# Patient Record
Sex: Male | Born: 1950
Health system: Southern US, Community
[De-identification: ages and names within clinical notes are randomized; demographics above are authoritative.]

## PROBLEM LIST (undated history)

## (undated) DIAGNOSIS — Z889 Allergy status to unspecified drugs, medicaments and biological substances status: Secondary | ICD-10-CM

## (undated) DIAGNOSIS — R519 Headache, unspecified: Secondary | ICD-10-CM

## (undated) DIAGNOSIS — I10 Essential (primary) hypertension: Secondary | ICD-10-CM

## (undated) DIAGNOSIS — R509 Fever, unspecified: Secondary | ICD-10-CM

## (undated) DIAGNOSIS — R0981 Nasal congestion: Secondary | ICD-10-CM

## (undated) DIAGNOSIS — E785 Hyperlipidemia, unspecified: Secondary | ICD-10-CM

## (undated) DIAGNOSIS — K219 Gastro-esophageal reflux disease without esophagitis: Secondary | ICD-10-CM

## (undated) DIAGNOSIS — J8410 Pulmonary fibrosis, unspecified: Secondary | ICD-10-CM

## (undated) DIAGNOSIS — R51 Headache: Secondary | ICD-10-CM

## (undated) HISTORY — DX: Nasal congestion: R09.81

## (undated) HISTORY — DX: Allergy status to unspecified drugs, medicaments and biological substances: Z88.9

## (undated) HISTORY — DX: Gastro-esophageal reflux disease without esophagitis: K21.9

## (undated) HISTORY — DX: Headache, unspecified: R51.9

## (undated) HISTORY — DX: Essential (primary) hypertension: I10

## (undated) HISTORY — DX: Hyperlipidemia, unspecified: E78.5

## (undated) HISTORY — DX: Headache: R51

## (undated) HISTORY — DX: Fever, unspecified: R50.9

---
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
## 2010-06-24 DIAGNOSIS — J8410 Pulmonary fibrosis, unspecified: Secondary | ICD-10-CM

## 2010-06-24 HISTORY — DX: Pulmonary fibrosis, unspecified: J84.10

## 2010-07-17 ENCOUNTER — Encounter
Admission: RE | Admit: 2010-07-17 | Discharge: 2010-07-17 | Payer: Self-pay | Source: Home / Self Care | Attending: Family Medicine | Admitting: Family Medicine

## 2010-10-25 ENCOUNTER — Other Ambulatory Visit: Payer: Self-pay | Admitting: Family Medicine

## 2010-10-25 DIAGNOSIS — R059 Cough, unspecified: Secondary | ICD-10-CM

## 2010-10-25 DIAGNOSIS — R0600 Dyspnea, unspecified: Secondary | ICD-10-CM

## 2010-10-25 DIAGNOSIS — R05 Cough: Secondary | ICD-10-CM

## 2010-10-26 ENCOUNTER — Ambulatory Visit
Admission: RE | Admit: 2010-10-26 | Discharge: 2010-10-26 | Disposition: A | Discharge status: Home / Self Care | Payer: BC Managed Care – PPO | Source: Ambulatory Visit | Attending: Family Medicine | Admitting: Family Medicine

## 2010-10-26 DIAGNOSIS — R05 Cough: Secondary | ICD-10-CM

## 2010-10-26 DIAGNOSIS — R0600 Dyspnea, unspecified: Secondary | ICD-10-CM

## 2010-10-26 DIAGNOSIS — R059 Cough, unspecified: Secondary | ICD-10-CM

## 2010-10-26 MED ORDER — IOHEXOL 300 MG/ML  SOLN
75.0000 mL | Freq: Once | INTRAMUSCULAR | Status: AC | PRN
  Administered 2010-10-26: 75 mL via INTRAVENOUS

## 2010-10-30 ENCOUNTER — Encounter: Payer: Self-pay | Admitting: Internal Medicine

## 2010-10-31 ENCOUNTER — Other Ambulatory Visit (HOSPITAL_COMMUNITY)
Admission: RE | Admit: 2010-10-31 | Discharge: 2010-10-31 | Disposition: A | Discharge status: Home / Self Care | Payer: BC Managed Care – PPO | Source: Ambulatory Visit | Attending: Internal Medicine | Admitting: Internal Medicine

## 2010-10-31 ENCOUNTER — Ambulatory Visit (INDEPENDENT_AMBULATORY_CARE_PROVIDER_SITE_OTHER): Payer: BC Managed Care – PPO | Admitting: Internal Medicine

## 2010-10-31 ENCOUNTER — Other Ambulatory Visit: Payer: Self-pay | Admitting: Internal Medicine

## 2010-10-31 ENCOUNTER — Encounter: Payer: Self-pay | Admitting: Internal Medicine

## 2010-10-31 ENCOUNTER — Ambulatory Visit (INDEPENDENT_AMBULATORY_CARE_PROVIDER_SITE_OTHER)
Admission: RE | Admit: 2010-10-31 | Discharge: 2010-10-31 | Disposition: A | Discharge status: Home / Self Care | Payer: BC Managed Care – PPO | Source: Ambulatory Visit | Attending: Internal Medicine | Admitting: Internal Medicine

## 2010-10-31 ENCOUNTER — Other Ambulatory Visit: Payer: BC Managed Care – PPO

## 2010-10-31 DIAGNOSIS — R059 Cough, unspecified: Secondary | ICD-10-CM

## 2010-10-31 DIAGNOSIS — R0602 Shortness of breath: Secondary | ICD-10-CM | POA: Insufficient documentation

## 2010-10-31 DIAGNOSIS — R05 Cough: Secondary | ICD-10-CM | POA: Insufficient documentation

## 2010-10-31 DIAGNOSIS — J90 Pleural effusion, not elsewhere classified: Secondary | ICD-10-CM | POA: Insufficient documentation

## 2010-10-31 NOTE — Progress Notes (Signed)
Quick Note:  Spoke with pt and notified of results per Dr. Wert. Pt verbalized understanding and denied any questions.  ______ 

## 2010-10-31 NOTE — Progress Notes (Signed)
  Subjective:    Patient ID: Larry [name], male    DOB: [birth year], 59 y.o.   MRN: [id number]  HPI  37 yobm  Never smoked in country since around 1990 from [place]    10/31/2010 Initial pulmonary office eval cc cough x 2 months and sob x sev weeks x fast walk,  Really not very productive. Pt denies any significant sore throat, dysphagia, itching, sneezing,  nasal congestion or excess/ purulent secretions,  fever, chills, sweats, unintended wt loss, pleuritic or exertional cp, hempoptysis, orthopnea pnd or leg swelling.    Also denies any obvious fluctuation of symptoms with weather or environmental changes or other aggravating or alleviating factors.   No h/o rheumatologic dz, previous cancer, pna.  Does have some dental issues but not for past year.     PMHx: Hypertension Hyperlipidemia R Pleural effusion     - R tcentesis x 800 cc 10/31/2010    Surgery:  None   Soc Hx: Never smoked, no heavy industry  Fm Hx  HBP/  Dm mother ? Colon problem in father 5 total siblings all healthy  Review of Systems  Constitutional: Positive for unexpected weight change. Negative for fever, chills, activity change and appetite change.  HENT: Positive for congestion and sore throat. Negative for rhinorrhea, sneezing, trouble swallowing, dental problem, voice change and postnasal drip.   Eyes: Negative for visual disturbance.  Respiratory: Positive for cough and shortness of breath. Negative for choking.   Cardiovascular: Negative for chest pain and leg swelling.  Gastrointestinal: Negative for nausea, vomiting and abdominal pain.  Genitourinary: Negative for difficulty urinating.  Musculoskeletal: Negative for arthralgias.  Skin: Negative for rash.  Neurological: Positive for headaches.  Psychiatric/Behavioral: Negative for behavioral problems and confusion.       Objective:   Physical Exam     amb bm nad   Wt 199 10/31/2010  HEENT: nl dentition, turbinates, and orophanx. Nl external  ear canals without cough reflex   NECK :  without JVD/Nodes/TM/ nl carotid upstrokes bilaterally   LUNGS: no acc muscle use, decreased bs with dullness right base   CV:  RRR  no s3 or murmur or increase in P2, no edema   ABD:  soft and nontender with nl excursion in the supine position. No bruits or organomegaly, bowel sounds nl  MS:  warm without deformities, calf tenderness, cyanosis or clubbing  SKIN: warm and dry without lesions    NEURO:  alert, approp, no deficits   Ct chest 10/25/10 reviewed:  1. Large right hilar mass is suspicious for primary lung neoplasm.  2. Evidence of extensive mediastinal and left hilar lymph node  metastasis.  3. Large right pleural effusion.  4. Postobstructive atelectasis and consolidation involving the  right upper lobe and right middle lobe.  4. Cannot exclude lymphangitic spread of tumor throughout the  right upper lobe.   Assessment & Plan:

## 2010-10-31 NOTE — Assessment & Plan Note (Signed)
Procedure R thoracentesis performed under sterile conditions with 1% lidocaine  #18 g needle at right base first stick no air > 800 cc fluid obtained  F/u cxr decreased effusion, no ptx  ddx includes parapneumonic and malignant sources, await studies

## 2010-10-31 NOTE — Patient Instructions (Signed)
We will call you with results when available.

## 2010-11-02 ENCOUNTER — Telehealth: Payer: Self-pay | Admitting: Internal Medicine

## 2010-11-02 NOTE — Telephone Encounter (Signed)
Please advise Dr. Sherene Sires pt is requesting cxr results from 5/9. Thanks  Carver Fila, CMA

## 2010-11-06 ENCOUNTER — Telehealth: Payer: Self-pay | Admitting: Internal Medicine

## 2010-11-06 NOTE — Telephone Encounter (Signed)
I don't see any results in the emr at this point - see if they can just fax  the results to me or if you can print them somehow - I'm getting lost on all the emr bridges to no where on this case.

## 2010-11-06 NOTE — Telephone Encounter (Signed)
Dr. Sherene Sires, I told him that we would call him with lab results, have you seen anything come back yet? Pls advise, thanks!

## 2010-11-07 NOTE — Telephone Encounter (Signed)
Called [facility] and requested labs to be faxed.

## 2010-11-08 NOTE — Telephone Encounter (Signed)
Per MW- pt needs appt early next wk for f/u with cxr. Spoke with pt and sched appt for 11/12/10 at 9 am.

## 2010-11-12 ENCOUNTER — Ambulatory Visit (INDEPENDENT_AMBULATORY_CARE_PROVIDER_SITE_OTHER)
Admission: RE | Admit: 2010-11-12 | Discharge: 2010-11-12 | Disposition: A | Discharge status: Home / Self Care | Payer: BC Managed Care – PPO | Source: Ambulatory Visit | Attending: Internal Medicine | Admitting: Internal Medicine

## 2010-11-12 ENCOUNTER — Encounter: Payer: Self-pay | Admitting: Internal Medicine

## 2010-11-12 ENCOUNTER — Ambulatory Visit (INDEPENDENT_AMBULATORY_CARE_PROVIDER_SITE_OTHER): Payer: BC Managed Care – PPO | Admitting: Internal Medicine

## 2010-11-12 VITALS — BP 136/82 | HR 75 | Temp 98.0°F | Ht 74.0 in | Wt 199.0 lb

## 2010-11-12 DIAGNOSIS — J90 Pleural effusion, not elsewhere classified: Secondary | ICD-10-CM

## 2010-11-12 MED ORDER — TRAMADOL HCL 50 MG PO TABS: ORAL_TABLET | ORAL | Status: AC

## 2010-11-12 NOTE — Patient Instructions (Signed)
Please see patient coordinator before you leave today  to schedule thoracic surgery consultation for possible VATS surgery (like arthroscopy for a knee)  If needed we can remove fluid from your right lung on Friday am but call my nurse Verlon Au before you come at [phone] to arrange a time  For pain or cough try tramadol 1-2 every 4 hours as needed

## 2010-11-12 NOTE — Telephone Encounter (Signed)
Pt was seen today by Dr. Sherene Sires to discuss cxr results.  Therefore will sign off on this message.

## 2010-11-12 NOTE — Progress Notes (Signed)
  Subjective:    Patient ID: Larry [name], male    DOB: 1951/03/26, 60 y.o.   MRN: [id number]  HPI  20 yobm  Never smoked in country since around 1990 from [place]    10/31/2010 Initial pulmonary office eval cc cough x 2 months and sob x sev weeks x fast walk,  Really not very productive. rec dx tap > improved sob for a least a week.  Non dx features of chronic exudate  11/12/2010 ov/Elby Blackwelder cc sob with ex recurrent but not as severe, still dry cough.  Pt denies any significant sore throat, dysphagia, itching, sneezing,  nasal congestion or excess/ purulent secretions,  fever, chills, sweats, unintended wt loss, pleuritic or exertional cp, hempoptysis, orthopnea pnd or leg swelling.    Also denies any obvious fluctuation of symptoms with weather or environmental changes or other aggravating or alleviating factors.     PMHx: Hypertension Hyperlipidemia R Pleural effusion     - R tcentesis x 800 cc 10/31/2010  Exudative Lymph> segs, benign inflammatory changes on cyt   Surgery:  None   Soc Hx: Never smoked, no heavy industry  Fm Hx  HBP/  Dm mother ? Colon problem in father 5 total siblings all healthy        Objective:   Physical Exam     amb bm nad   Wt 199 10/31/2010  > 199 11/12/2010  HEENT: nl dentition, turbinates, and orophanx. Nl external ear canals without cough reflex   NECK :  without JVD/Nodes/TM/ nl carotid upstrokes bilaterally   LUNGS: no acc muscle use, decreased bs with dullness right base   CV:  RRR  no s3 or murmur or increase in P2, no edema   ABD:  soft and nontender with nl excursion in the supine position. No bruits or organomegaly, bowel sounds nl  MS:  warm without deformities, calf tenderness, cyanosis or clubbing  SKIN: warm and dry without lesions      Ct chest 10/25/10 reviewed:  1. Large right hilar mass is suspicious for primary lung neoplasm.  2. Evidence of extensive mediastinal and left hilar lymph node  metastasis.  3. Large  right pleural effusion.  4. Postobstructive atelectasis and consolidation involving the  right upper lobe and right middle lobe.  4. Cannot exclude lymphangitic spread of tumor throughout the  right upper lobe.   Assessment & Plan:

## 2010-11-12 NOTE — Progress Notes (Signed)
Quick Note:  Spoke with pt and notified of results per Dr. Wert. Pt verbalized understanding and denied any questions.  ______ 

## 2010-11-12 NOTE — Assessment & Plan Note (Signed)
Discussed in detail all the  indications, usual  risks and alternatives  relative to the benefits with patient who agrees to proceed with VATS referral as next step though also may need fob same time to be sure thiere is no airway obstructions  Can do therapeutic tap if needed while awaiting T surgery evaluation

## 2010-11-13 ENCOUNTER — Encounter: Payer: Self-pay | Admitting: Internal Medicine

## 2010-11-14 ENCOUNTER — Inpatient Hospital Stay (HOSPITAL_COMMUNITY): Payer: BC Managed Care – PPO

## 2010-11-14 ENCOUNTER — Emergency Department (HOSPITAL_COMMUNITY): Payer: BC Managed Care – PPO

## 2010-11-14 ENCOUNTER — Inpatient Hospital Stay (HOSPITAL_COMMUNITY)
Admission: EM | Admit: 2010-11-14 | Discharge: 2010-11-22 | DRG: 085 | Disposition: A | Discharge status: Home Health | Payer: BC Managed Care – PPO | Attending: Critical Care Medicine | Admitting: Critical Care Medicine

## 2010-11-14 ENCOUNTER — Encounter: Payer: BC Managed Care – PPO | Admitting: Thoracic Surgery

## 2010-11-14 DIAGNOSIS — R222 Localized swelling, mass and lump, trunk: Secondary | ICD-10-CM | POA: Diagnosis not present

## 2010-11-14 DIAGNOSIS — E876 Hypokalemia: Secondary | ICD-10-CM | POA: Diagnosis not present

## 2010-11-14 DIAGNOSIS — I10 Essential (primary) hypertension: Secondary | ICD-10-CM | POA: Diagnosis present

## 2010-11-14 DIAGNOSIS — J90 Pleural effusion, not elsewhere classified: Principal | ICD-10-CM | POA: Diagnosis present

## 2010-11-14 LAB — CBC
HCT: 36.1 % — ABNORMAL LOW (ref 39.0–52.0)
Hemoglobin: 12.6 g/dL — ABNORMAL LOW (ref 13.0–17.0)
MCH: 28.6 pg (ref 26.0–34.0)
MCHC: 34.9 g/dL (ref 30.0–36.0)

## 2010-11-14 LAB — URINALYSIS, ROUTINE W REFLEX MICROSCOPIC
Bilirubin Urine: NEGATIVE
Nitrite: NEGATIVE
Specific Gravity, Urine: 1.029 (ref 1.005–1.030)
pH: 7 (ref 5.0–8.0)

## 2010-11-14 LAB — DIFFERENTIAL
Basophils Absolute: 0 10*3/uL (ref 0.0–0.1)
Lymphocytes Relative: 19 % (ref 12–46)
Monocytes Absolute: 0.7 10*3/uL (ref 0.1–1.0)
Monocytes Relative: 14 % — ABNORMAL HIGH (ref 3–12)
Neutro Abs: 3.2 10*3/uL (ref 1.7–7.7)

## 2010-11-14 LAB — POCT I-STAT, CHEM 8
Chloride: 101 mEq/L (ref 96–112)
Glucose, Bld: 104 mg/dL — ABNORMAL HIGH (ref 70–99)
HCT: 37 % — ABNORMAL LOW (ref 39.0–52.0)
Potassium: 3 mEq/L — ABNORMAL LOW (ref 3.5–5.1)

## 2010-11-14 LAB — PROTIME-INR: INR: 1.11 (ref 0.00–1.49)

## 2010-11-14 MED ORDER — IOHEXOL 300 MG/ML  SOLN
100.0000 mL | Freq: Once | INTRAMUSCULAR | Status: AC | PRN
  Administered 2010-11-14: 100 mL via INTRAVENOUS

## 2010-11-15 ENCOUNTER — Inpatient Hospital Stay (HOSPITAL_COMMUNITY): Payer: BC Managed Care – PPO

## 2010-11-15 ENCOUNTER — Other Ambulatory Visit: Payer: Self-pay | Admitting: Interventional Radiology

## 2010-11-15 ENCOUNTER — Other Ambulatory Visit (HOSPITAL_COMMUNITY): Payer: BC Managed Care – PPO

## 2010-11-15 DIAGNOSIS — J90 Pleural effusion, not elsewhere classified: Secondary | ICD-10-CM

## 2010-11-15 DIAGNOSIS — D381 Neoplasm of uncertain behavior of trachea, bronchus and lung: Secondary | ICD-10-CM

## 2010-11-15 LAB — CBC
MCV: 82.4 fL (ref 78.0–100.0)
Platelets: 219 10*3/uL (ref 150–400)
RBC: 4.61 MIL/uL (ref 4.22–5.81)
WBC: 5.7 10*3/uL (ref 4.0–10.5)

## 2010-11-15 LAB — PROTIME-INR
INR: 1.08 (ref 0.00–1.49)
Prothrombin Time: 14.2 seconds (ref 11.6–15.2)

## 2010-11-15 LAB — BASIC METABOLIC PANEL
BUN: 7 mg/dL (ref 6–23)
Calcium: 10 mg/dL (ref 8.4–10.5)
Creatinine, Ser: 0.66 mg/dL (ref 0.4–1.5)
GFR calc Af Amer: >60 mL/min (ref >60)

## 2010-11-15 LAB — URINE CULTURE
Colony Count: 9000
Culture  Setup Time: 201205240026

## 2010-11-15 LAB — PROTEIN, BODY FLUID: Total protein, fluid: 5.2 g/dL

## 2010-11-15 LAB — PSA: PSA: 1.16 ng/mL (ref <=4.00)

## 2010-11-15 LAB — BODY FLUID CELL COUNT WITH DIFFERENTIAL
Neutrophil Count, Fluid: 1 % (ref 0–25)
Total Nucleated Cell Count, Fluid: 4066 cu mm — ABNORMAL HIGH (ref 0–1000)

## 2010-11-15 LAB — PROTEIN, TOTAL: Total Protein: 6.7 g/dL (ref 6.0–8.3)

## 2010-11-15 LAB — LACTATE DEHYDROGENASE, PLEURAL OR PERITONEAL FLUID

## 2010-11-15 LAB — APTT: aPTT: 34 seconds (ref 24–37)

## 2010-11-16 ENCOUNTER — Inpatient Hospital Stay (HOSPITAL_COMMUNITY): Payer: BC Managed Care – PPO

## 2010-11-16 ENCOUNTER — Other Ambulatory Visit: Payer: Self-pay | Admitting: Emergency Medicine

## 2010-11-16 ENCOUNTER — Other Ambulatory Visit: Payer: Self-pay | Admitting: Thoracic Surgery

## 2010-11-16 DIAGNOSIS — J90 Pleural effusion, not elsewhere classified: Secondary | ICD-10-CM

## 2010-11-16 DIAGNOSIS — J8410 Pulmonary fibrosis, unspecified: Secondary | ICD-10-CM

## 2010-11-16 HISTORY — PX: ENDOBRONCHIAL ULTRASOUND: SHX5096

## 2010-11-16 HISTORY — PX: OTHER SURGICAL HISTORY: SHX169

## 2010-11-16 HISTORY — PX: FIBEROPTIC BRONCHOSCOPY: SHX5367

## 2010-11-16 LAB — COMPREHENSIVE METABOLIC PANEL
ALT: 24 U/L (ref 0–53)
AST: 24 U/L (ref 0–37)
Alkaline Phosphatase: 93 U/L (ref 39–117)
CO2: 30 mEq/L (ref 19–32)
Calcium: 9.6 mg/dL (ref 8.4–10.5)
GFR calc Af Amer: >60 mL/min (ref >60)
GFR calc non Af Amer: >60 mL/min (ref >60)
Glucose, Bld: 103 mg/dL — ABNORMAL HIGH (ref 70–99)
Potassium: 3.1 mEq/L — ABNORMAL LOW (ref 3.5–5.1)
Sodium: 137 mEq/L (ref 135–145)
Total Protein: 6.5 g/dL (ref 6.0–8.3)

## 2010-11-16 LAB — TYPE AND SCREEN
ABO/RH(D): A POS
Antibody Screen: NEGATIVE

## 2010-11-16 LAB — TRIGLYCERIDES, BODY FLUIDS: Triglycerides, Fluid: 21 mg/dL

## 2010-11-16 LAB — CBC
HCT: 36.6 % — ABNORMAL LOW (ref 39.0–52.0)
Hemoglobin: 12.6 g/dL — ABNORMAL LOW (ref 13.0–17.0)
MCHC: 34.4 g/dL (ref 30.0–36.0)
WBC: 4.3 10*3/uL (ref 4.0–10.5)

## 2010-11-16 LAB — MAGNESIUM: Magnesium: 1.8 mg/dL (ref 1.5–2.5)

## 2010-11-16 LAB — SURGICAL PCR SCREEN: Staphylococcus aureus: NEGATIVE

## 2010-11-17 ENCOUNTER — Inpatient Hospital Stay (HOSPITAL_COMMUNITY): Payer: BC Managed Care – PPO

## 2010-11-17 LAB — BASIC METABOLIC PANEL
CO2: 29 mEq/L (ref 19–32)
Chloride: 99 mEq/L (ref 96–112)
GFR calc non Af Amer: >60 mL/min (ref >60)
Glucose, Bld: 138 mg/dL — ABNORMAL HIGH (ref 70–99)
Potassium: 3.3 mEq/L — ABNORMAL LOW (ref 3.5–5.1)
Sodium: 135 mEq/L (ref 135–145)

## 2010-11-17 LAB — CBC
HCT: 37.2 % — ABNORMAL LOW (ref 39.0–52.0)
Hemoglobin: 13 g/dL (ref 13.0–17.0)
MCV: 82.5 fL (ref 78.0–100.0)
RBC: 4.51 MIL/uL (ref 4.22–5.81)
WBC: 7.1 10*3/uL (ref 4.0–10.5)

## 2010-11-17 LAB — POCT I-STAT 3, ART BLOOD GAS (G3+)
Bicarbonate: 28.3 mEq/L — ABNORMAL HIGH (ref 20.0–24.0)
TCO2: 30 mmol/L (ref 0–100)
pH, Arterial: 7.445 (ref 7.350–7.450)
pO2, Arterial: 85 mmHg (ref 80.0–100.0)

## 2010-11-18 ENCOUNTER — Inpatient Hospital Stay (HOSPITAL_COMMUNITY): Payer: BC Managed Care – PPO

## 2010-11-18 LAB — CBC
Platelets: 228 10*3/uL (ref 150–400)
RDW: 13.6 % (ref 11.5–15.5)
WBC: 9.1 10*3/uL (ref 4.0–10.5)

## 2010-11-18 LAB — COMPREHENSIVE METABOLIC PANEL
ALT: 18 U/L (ref 0–53)
Alkaline Phosphatase: 84 U/L (ref 39–117)
CO2: 29 mEq/L (ref 19–32)
Calcium: 9.3 mg/dL (ref 8.4–10.5)
GFR calc non Af Amer: >60 mL/min (ref >60)
Glucose, Bld: 121 mg/dL — ABNORMAL HIGH (ref 70–99)
Potassium: 3.4 mEq/L — ABNORMAL LOW (ref 3.5–5.1)
Sodium: 133 mEq/L — ABNORMAL LOW (ref 135–145)

## 2010-11-18 LAB — BODY FLUID CULTURE: Culture: NO GROWTH

## 2010-11-19 ENCOUNTER — Inpatient Hospital Stay (HOSPITAL_COMMUNITY): Payer: BC Managed Care – PPO

## 2010-11-19 LAB — BASIC METABOLIC PANEL
CO2: 30 mEq/L (ref 19–32)
Calcium: 8.9 mg/dL (ref 8.4–10.5)
Chloride: 97 mEq/L (ref 96–112)
GFR calc Af Amer: >60 mL/min (ref >60)
Sodium: 135 mEq/L (ref 135–145)

## 2010-11-19 LAB — URINALYSIS, ROUTINE W REFLEX MICROSCOPIC
Ketones, ur: NEGATIVE mg/dL
Nitrite: NEGATIVE
Urobilinogen, UA: 1 mg/dL (ref 0.0–1.0)
pH: 7 (ref 5.0–8.0)

## 2010-11-19 LAB — TISSUE CULTURE: Culture: NO GROWTH

## 2010-11-19 LAB — PHOSPHORUS: Phosphorus: 3 mg/dL (ref 2.3–4.6)

## 2010-11-19 LAB — CBC
Hemoglobin: 12.7 g/dL — ABNORMAL LOW (ref 13.0–17.0)
MCHC: 35.1 g/dL (ref 30.0–36.0)
RBC: 4.41 MIL/uL (ref 4.22–5.81)

## 2010-11-19 LAB — MAGNESIUM: Magnesium: 2 mg/dL (ref 1.5–2.5)

## 2010-11-20 ENCOUNTER — Inpatient Hospital Stay (HOSPITAL_COMMUNITY): Payer: BC Managed Care – PPO

## 2010-11-20 LAB — BODY FLUID CULTURE

## 2010-11-20 LAB — URINE CULTURE: Colony Count: NO GROWTH

## 2010-11-20 LAB — BASIC METABOLIC PANEL
BUN: 9 mg/dL (ref 6–23)
Calcium: 7 mg/dL — ABNORMAL LOW (ref 8.4–10.5)
Creatinine, Ser: 0.47 mg/dL (ref 0.4–1.5)
GFR calc non Af Amer: >60 mL/min (ref >60)
Potassium: 2.9 mEq/L — ABNORMAL LOW (ref 3.5–5.1)

## 2010-11-21 ENCOUNTER — Inpatient Hospital Stay (HOSPITAL_COMMUNITY): Payer: BC Managed Care – PPO

## 2010-11-21 NOTE — Op Note (Signed)
  NAMEDAIDEN, COLTRANE NO.:  [id number]  MEDICAL RECORD NO.:  [id number]           PATIENT TYPE:  LOCATION:                                 FACILITY:  PHYSICIAN:  Ines Bloomer, M.D. DATE OF BIRTH:  [birth year]  DATE OF PROCEDURE: DATE OF DISCHARGE:                              OPERATIVE REPORT   PREOPERATIVE DIAGNOSES: 1. Right hilar mass. 2. Right pleural effusion.  POSTOPERATIVE DIAGNOSES: 1. Right hilar mass. 2. Right pleural effusion. 3. Questionable granulomatous process.  OPERATIONS PERFORMED: 1. Fiberoptic bronchoscopy. 2. Endobronchial ultrasound. 3. Right VATS. 4. Drainage of pleural effusion. 5. Pleural biopsies. 6. Insertion of PleurX catheter.  SURGEONS: 1. Ines Bloomer, MD 2. Leslye Peer, MD  General anesthesia.  Dr. Delton Coombes will dictate the endobronchial ultrasound, an EBUS.  This patient, after having this bronchoscopy, was turned to right lateral thoracotomy position and was prepped and draped in usual sterile manner. Three trocar sites were made, one at the midaxillary line at the seventh intercostal space and a 30-degrees scope was inserted and pictures were taken of multiple processes.  Multiple nodules both on the lung as well as on the pleura as well as evidence of plain lymph nodes.  We sent this for frozen section and it showed a granulomatous process.  We also sent the fluid for culture, fungus and TB culture as well as the pleural biopsies for fungus and TB culture.  We then inserted a #24 chest tube through the trocar site, but prior to doing this, we made two other 5-mm trocar sites and through that, inserted a PleurX catheter directly under direct vision into the right pleura.  We then tunneled it so that the cuff was in the tunnel track and brought out the tunneler, the PleurX catheter anteriorly, and applied a dressing.  We sutured the chest tube in place with 0 silk and then a Marcaine block in the  usual fashion.  The patient was returned to the recovery room in stable condition.     Ines Bloomer, M.D.     DPB/MEDQ  D:  11/16/2010  T:  11/17/2010  Job:  [id number]  Electronically Signed by Jovita Gamma M.D. on 11/21/2010 10:54:27 AM

## 2010-11-21 NOTE — Consult Note (Signed)
  [name], [name] NO.:  [id number]  MEDICAL RECORD NO.:  [id number]           PATIENT TYPE:  I  LOCATION:  5529                         FACILITY:  [facility]  PHYSICIAN:  Ines Bloomer, M.D. DATE OF BIRTH:  [birth year]  DATE OF CONSULTATION: DATE OF DISCHARGE:                                CONSULTATION   CHIEF COMPLAINT:  Shortness of breath.  HISTORY OF PRESENT ILLNESS:  This 60 year old patient was admitted yesterday with recurrent right pleural effusion.  He had thoracentesis in the office on Nov 03, 2010, that had 800 mL of fluid and was sent to see in the office but came to the emergency room with increasing shortness of breath and got admitted and have a right thoracentesis, which was drained 1500 mL.  The patient also on CT scan has a right upper lobe mass with a mediastinal adenopathy.  He has had no hemoptysis, fever, chills, or excessive sputum.  PAST MEDICAL HISTORY:  Hypertension and hypercholesterolemia.  MEDICATIONS:  Tramadol, Phenergan with codeine for cough, mometasone cream, ibuprofen, fluconazole nasal spray, Lipitor, atenolol, hydrochlorothiazide, EMLA, amlodipine, and Tylenol with Codeine.  He has no allergies.  FAMILY HISTORY:  Noncontributory.  SOCIAL HISTORY:  As mentioned he is married.  Does not smoke, does not drink, been treated previously by [facility].  REVIEW OF SYSTEMS:  GENERAL:  No weight loss, has been unstable. CARDIAC:  No angina or atrial fibrillation.  PULMONARY:  Shortness of breath and a dry cough.  No hemoptysis.  GI:  No nausea, vomiting, constipation, or diarrhea.  GU:  No kidney disease, dysuria or frequent urination.  VASCULAR:  No claudication, DVT, TIAs.  NEUROLOGICAL:  No dizziness, headaches, blackouts, seizures.  MUSCULOSKELETAL:  No arthritis.  PSYCHIATRIC:  No depression or nervousness.  EYES/ENT:  No changes in eyesight or hearing.  HEMATOLOGICAL:  No problems with bleeding,  clotting disorders, or anemia.  PHYSICAL EXAMINATION:  GENERAL:  He is a well-developed Caucasian [place] American male in no acute distress. VITAL SIGNS:  His blood pressure was 139/69, pulse 80, respirations 18, sats were 98%. HEAD, EYES, EARS, NOSE, AND THROAT:  Unremarkable. NECK:  Supple without thyromegaly.  There is no supraclavicular or axillary adenopathy. CHEST:  Clear to auscultation and percussion on the left, decreased breath sounds on the right. HEART:  Regular sinus rhythm.  No murmurs. ABDOMEN:  Soft.  There is no hepatosplenomegaly. EXTREMITIES:  Pulses are 2+.  There is no clubbing or edema. NEUROLOGICAL:  He is oriented x3.  Sensory and motor intact.  Cranial nerves intact.  IMPRESSION: 1. Right upper lobe mass on CT scan; right pleural effusion,     recurrent; questionable pleural involvement. 2. Hypertension. 3. Hyperlipidemia.  PLAN:  Fiberoptic bronchoscopy with endobronchial ultrasound, right VATS, drainage of pleural effusion, talc pleurodesis, and insertion of PleurX catheter.     Ines Bloomer, M.D.     DPB/MEDQ  D:  11/15/2010  T:  11/16/2010  Job:  [id number]  Electronically Signed by Jovita Gamma M.D. on 11/21/2010 10:54:25 AM

## 2010-11-22 ENCOUNTER — Inpatient Hospital Stay (HOSPITAL_COMMUNITY): Payer: BC Managed Care – PPO

## 2010-11-22 LAB — BASIC METABOLIC PANEL
CO2: 28 mEq/L (ref 19–32)
Chloride: 99 mEq/L (ref 96–112)
GFR calc Af Amer: >60 mL/min (ref >60)
Glucose, Bld: 95 mg/dL (ref 70–99)
Potassium: 4 mEq/L (ref 3.5–5.1)
Sodium: 135 mEq/L (ref 135–145)

## 2010-11-24 LAB — HISTOPLASMA ANTIGEN, URINE

## 2010-11-27 ENCOUNTER — Other Ambulatory Visit: Payer: Self-pay | Admitting: Thoracic Surgery

## 2010-11-27 DIAGNOSIS — J90 Pleural effusion, not elsewhere classified: Secondary | ICD-10-CM

## 2010-11-28 ENCOUNTER — Ambulatory Visit (INDEPENDENT_AMBULATORY_CARE_PROVIDER_SITE_OTHER): Payer: Self-pay | Admitting: Thoracic Surgery

## 2010-11-28 ENCOUNTER — Ambulatory Visit
Admission: RE | Admit: 2010-11-28 | Discharge: 2010-11-28 | Disposition: A | Discharge status: Home / Self Care | Payer: BC Managed Care – PPO | Source: Ambulatory Visit | Attending: Thoracic Surgery | Admitting: Thoracic Surgery

## 2010-11-28 DIAGNOSIS — J90 Pleural effusion, not elsewhere classified: Secondary | ICD-10-CM

## 2010-11-28 DIAGNOSIS — J8410 Pulmonary fibrosis, unspecified: Secondary | ICD-10-CM

## 2010-11-29 NOTE — Letter (Signed)
November 28, 2010  Charlaine Dalton. Sherene Sires, MD, FCCP [address] [place], [place] [address]  Re:  Larry [name], Larry [name]                 DOB:  1950-08-16  Dear Dr. Sherene Sires:  I saw the patient back today.  His chest x-ray still shows an inflammatory process in the right lower lobe.  He has minimal drainage from his PleurX catheter.  His blood pressure was 143/75, sats were 96%. He is feeling well, removed his chest tube stitches, as well as pleuritic stitches.  We tried to drain today and it did not drain a whole lot, so probably we will remove his PleurX next week when I see him back with another chest x-ray.  We are still awaiting culture results.  Larry [name], M.D. Electronically Signed  DPB/MEDQ  D:  11/28/2010  T:  11/29/2010  Job:  [id number]  cc:   Dr. Tenny Craw

## 2010-12-03 ENCOUNTER — Other Ambulatory Visit: Payer: Self-pay | Admitting: Thoracic Surgery

## 2010-12-03 DIAGNOSIS — J90 Pleural effusion, not elsewhere classified: Secondary | ICD-10-CM

## 2010-12-04 ENCOUNTER — Ambulatory Visit (INDEPENDENT_AMBULATORY_CARE_PROVIDER_SITE_OTHER): Payer: Self-pay | Admitting: Thoracic Surgery

## 2010-12-04 ENCOUNTER — Ambulatory Visit
Admission: RE | Admit: 2010-12-04 | Discharge: 2010-12-04 | Disposition: A | Discharge status: Home / Self Care | Payer: BC Managed Care – PPO | Source: Ambulatory Visit | Attending: Thoracic Surgery | Admitting: Thoracic Surgery

## 2010-12-04 DIAGNOSIS — J90 Pleural effusion, not elsewhere classified: Secondary | ICD-10-CM

## 2010-12-05 NOTE — Letter (Signed)
December 04, 2010  Charlaine Dalton. Sherene Sires, MD, FCCP [address] [place], [place] [address]  Re:  BURNIE, THERIEN                 DOB:  September 10, 1950  The patient came today.  His blood pressure was 133/80, pulse 68, respirations 18, sats were 95.  He was afebrile.  Chest x-ray still showed there was some kind of inflammatory process in the right lower lobe.  We did not drain any more from his PleurX catheter, so I went ahead and removed the PleurX catheter.  We will see him back in 2 weeks and hopefully, we will have some possible results of the culture by that time and then I will get him to see Dr. Luciana Axe.  Ines Bloomer, M.D. Electronically Signed  DPB/MEDQ  D:  12/04/2010  T:  12/05/2010  Job:  [id number]  cc:   Gardiner Barefoot, MD Charlaine Dalton. Sherene Sires, MD, FCCP

## 2010-12-05 NOTE — Op Note (Signed)
Larry [name], Larry [name]               ACCOUNT NO.:  [id number]  MEDICAL RECORD NO.:  [id number]           PATIENT TYPE:  I  LOCATION:  2302                         FACILITY:  [facility]  PHYSICIAN:  Leslye Peer, MD    DATE OF BIRTH:  12/09/1950  DATE OF PROCEDURE:  11/16/2010 DATE OF DISCHARGE:                              OPERATIVE REPORT   PROCEDURE:  Video fiberoptic bronchoscopy with endobronchial ultrasound and biopsies.  OPERATOR:  Leslye Peer, MD.  INDICATIONS:  Right upper lobe mass and pleural effusion.  MEDICATIONS GIVEN:  The procedure was done under general anesthesia with endotracheal intubation.  CONSENT:  Informed consent was signed by the patient and is on his chart.  PROCEDURE DETAILS:  After informed consent was obtained, the patient was taken to the operating room and placed under general anesthesia.  A formal time-out was performed and then a screening bronchoscopy was initiated via the patient's endotracheal tube.  The main carina was sharp and normal in appearance.  The right mainstem bronchus showed proximal pearly white lesions on the airway wall.  These extended from the right upper lobe orifice where there was an endobronchial lesion occluding the anterior subsegment consistent with a primary lung carcinoma.  The pearly white endobronchial abnormalities extended down the bronchus intermedius, particularly along the lateral wall and to the right middle lobe and right lower lobe airways.  The bronchoscope was then withdrawn and a left-sided inspection was performed.  There were no mucosal abnormalities seen in the left mainstem, left upper lobe lingula, and left lower lobe airways.  There were no endobronchial lesions or abnormal secretions on the left side.  The standard bronchoscope was then removed and the endobronchial ultrasound was introduced.  Using the endobronchial ultrasound, Wang needle biopsies were performed into a right upper lobe mass  that was localized in the medial aspect of the right upper lobe as well as the lateral aspect of the bronchus intermedius.  Needle biopsies were performed at these locations and sent for cytology.  The patient tolerated the Wang needle biopsies well.  The endobronchial ultrasound was removed and the standard bronchoscope was reintroduced.  Using the standard bronchoscope, endobronchial brushings were performed in the right upper lobe along the periphery of the apparent tumor.  These were sent for cytology.  Wang needle biopsies were also performed into the right upper lobe mass without ultrasound guidance to be sent for cytology.  Finally, endobronchial forceps biopsies were performed in the right upper lobe, at the right upper lobe carina, at the right middle lobe carina, and at the right lower lobe carina.  These samples will be sent for pathology. A bronchoalveolar lavage from the right upper lobe was performed and will be sent for cytology.  The patient tolerated the procedure well. There was no significant blood loss and there were no obvious complications.  At the end of the procedure, the patient was positioned and prepped to undergo a right VATS by Dr. Karle Plumber to allow evaluation of the pleura and to place a Pleurx catheter.  Please refer to Dr. Scheryl Darter separate dictation regarding this portion of  his procedure.  SAMPLES: 1. Wang needle biopsies from the right upper lobe mass. 2. Endobronchial brushings from the right upper lobe. 3. Endobronchial biopsies from the right upper lobe. 4. Endobronchial biopsies from the right upper lobe carina. 5. Endobronchial biopsies from the right middle lobe carina. 6. Endobronchial biopsies from the right lower lobe carina. 7. Bronchoalveolar lavage from the right upper lobe.  PLAN:  We will follow up the cytology and pathology results with Mr. Barocio when they become available.     Leslye Peer, MD     RSB/MEDQ  D:   11/16/2010  T:  11/17/2010  Job:  [id number]  Electronically Signed by Levy Pupa MD on 12/05/2010 09:23:23 AM

## 2010-12-10 NOTE — Consult Note (Signed)
NAMEHARSHAL, Larry [name] NO.:  [id number]  MEDICAL RECORD NO.:  [id number]           PATIENT TYPE:  I  LOCATION:  2302                         FACILITY:  [facility]  PHYSICIAN:  Gardiner Barefoot, MD    DATE OF BIRTH:  [birth year]  DATE OF CONSULTATION: DATE OF DISCHARGE:                                CONSULTATION   CONSULTING PHYSICIAN:  Ines Bloomer, MD  REASON FOR CONSULTATION:  Granulomatous lung disease.  HISTORY OF PRESENT ILLNESS:  This is a 60 year old male originally from [place] who has been in [place] for the last 23 years who initially presented to Pulmonary as an outpatient with shortness of breath and noted on CT scan of his lungs to have a large right hilar mass with lymph node involvement and a large right pleural effusion concerning for malignancy.  The patient initially had thoracentesis at that time but was nondiagnostic and was scheduled to have further workup, however, represented to pulmonologist and noted to be significantly short of breath and was sent in for emergent evaluation. The patient was admitted by the Critical Care, Pulmonary Service, and seen by the thoracic surgeon, Dr. Edwyna Shell, as well and had a fiberoptic bronchoscopy with right VATS and drainage of pleural effusion. Perioperatively, it was noted that by histology and by observation that his findings were consistent with granulomatous disease.  The patient has last traveled back to [place] in about 2007.  He does have a remote history of tuberculosis exposure in the 1960s but none since and has been essentially in urban areas in [place] and he does return there.  He does endorse some mild subjective fevers and mild night sweats but does not soak his sheets.  Additionally, he does report that he has had a PPD which was reported as negative recently as well.  His family has no history of any unusual pulmonary disease nor sarcoidosis or other unusual  diagnoses.  PAST MEDICAL HISTORY: 1. Hypertension. 2. Hyperlipidemia.  MEDICATIONS: 1. Norvasc 5 mg daily. 2. Atenolol 100 mg daily. 3. Flonase 2 sprays daily. 4. Zocor 20 mg at each evening. 5. Cefuroxime.  ALLERGIES:  No known drug allergies.  SOCIAL HISTORY:  The patient has never smoked cigarettes, he denies alcohol or drug use.  FAMILY HISTORY:  There is history of high blood pressure and diabetes but no history of sarcoidosis or other rheumatologic diagnoses.  REVIEW OF SYSTEMS:  A 12-point review of systems was obtained, is negative except as per the history of present illness.  PHYSICAL EXAMINATION:  VITAL SIGNS:  The patient is afebrile, pulse 77, blood pressure 142 systolic. GENERAL:  The patient is awake, alert, and oriented x3 and appears in no acute distress. HEENT:  Anicteric with no thrush. NECK:  No cervical lymphadenopathy. CARDIOVASCULAR:  Regular rate and rhythm with no murmurs, rubs, or gallops. LUNGS:  Diminished on the right but otherwise no rhonchi or crackles appreciated. ABDOMEN:  Mildly distended, nontender with positive bowel sounds and no hepatosplenomegaly. GU:  With no lesions. EXTREMITIES:  No cyanosis, clubbing, edema. SKIN:  No rashes. LINES:  Right subclavian line is  clean.  Right chest tube with serosanguineous fluid coming out.  LABS:  WBC 7.1, hemoglobin is 13.  AFB smear is negative.  Other cultures pending.  There are mesothelial cells noted in a small percent.  ASSESSMENT:  A 60 year old male with granulomatous lung disease more consistent with infection and malignancy.  RECOMMENDATIONS:  I do favor a fungal etiology of this disease though diagnosis unclear at this time.  Pathology has been sent for all the appropriate stains and cultures.  I will await results of the AFB and fungal and other stains.  Also we will check urine histo antigen and blastomycosis serologies as well as repeat a PPD.  This is less likely to be  tuberculosis based on the patient's low epidemiologic exposure but we will await final results.  Other consideration is also likely sarcoidosis.     Gardiner Barefoot, MD     RWC/MEDQ  D:  11/17/2010  T:  11/17/2010  Job:  [id number]  Electronically Signed by Staci Righter MD on 12/10/2010 11:11:15 AM

## 2010-12-11 NOTE — Discharge Summary (Signed)
NAMEJIDENNA, Larry [name] NO.:  [id number]  MEDICAL RECORD NO.:  [id number]           PATIENT TYPE:  I  LOCATION:  2035                         FACILITY:  [facility]  PHYSICIAN:  Charlaine Dalton. Sherene Sires, MD, FCCPDATE OF BIRTH:  Sep 26, 1950  DATE OF ADMISSION:  11/14/2010 DATE OF DISCHARGE:  11/22/2010                              DISCHARGE SUMMARY   DISCHARGE DIAGNOSES: 1. Right lung mass. 2. Recurrent right pleural effusion. 3. Hypertension.  HISTORY OF PRESENT ILLNESS:  Larry [name] is a 60 year old male originally from [place] who has been in [place] for approximately 23 years, who is being worked up by Dr. Sherene Sires in the pulmonary office for right hilar mass and pleural effusion.  He had thoracentesis in the office by Dr. Sherene Sires on Oct 31, 2010, with approximately 800 mL of tea-colored fluid which was deemed to be exudative.  The patient was referred to Dr. Edwyna Shell in the outpatient setting for further evaluation of pleural effusion, but prior to his appointment, the patient had increased dyspnea and presented to [facility] on Nov 14, 2010, with complaints of worsening shortness of breath.  The patient was admitted for further evaluation and workup of right hilar mass and recurrent right pleural effusion.  LABORATORY DATA:  At the time of admission Nov 14, 2010, CBC, white blood cells 5.4, hemoglobin 12.6, hematocrit 36.1, platelets 182.  PT 14.5, INR 1.1.  Basic metabolic panel, sodium 135, potassium 3.0, glucose 104, BUN 7, creatinine 0.80.  Urinalysis was negative.  PSA 1.16.  Most recent laboratory data on Nov 22, 2010, basic metabolic panel, sodium 135, potassium 4.0, glucose 95, BUN 11, creatinine 0.75. Other pertinent laboratory data ACE level on Nov 18, 2010, was 20.  MICRODATA:  Right pleural fluid culture from Nov 16, 2010, final report is negative.  Urine culture from Nov 19, 2010, is negative.  Right lung tissue culture from Nov 16, 2010, is negative.   Fungal culture and AFB from Nov 16, 2010.  EBUS and VATS are still preliminary, but preliminarily negative.  PATHOLOGY DATA:  Cytology from EBUS on Nov 16, 2010, right lung bronchial washings, no malignant cells identified.  Fine-needle aspiration from endoscopic EBUS of right upper lobe, no malignant cells. Fine-needle aspiration of right upper lobe mass, no malignant cells. Surgical pathology from right VATS by Dr. Edwyna Shell shows granulomatous inflammation.  Right upper lobe lung biopsy shows few minute fragments of inflamed bronchial mucosa, but no malignancy identified.  Right upper lobe carina lung biopsy shows minute fragments of bronchial mucosa with granulomatous inflammation and no malignancy.  Right middle lobe carina shows the same, no malignancy and right lower lobe shows the same granulomatous inflammation and no malignancy.  Right pleural fluid from ultrasound-guided thoracentesis on Nov 15, 2010, again shows lymphocytosis and reactive mesothelial cells.  No malignancies identified.  RADIOLOGY DATA:  Most recent radiology data on Nov 21, 2010, shows right hydropneumothorax and right airspace disease are stable.  Right PleurX in place and patchy right lung airspace disease with moderate right pleural effusion.  CT of the chest on Nov 14, 2010, shows a large right pleural effusion  with increased compressive atelectasis of the right upper and lower lobes with increased hazy infiltrate in the right middle lobe.  No change in the right perihilar mass and mediastinal and hilar adenopathy, no pneumothorax.  HOSPITAL COURSE BY DISCHARGE DIAGNOSES: 1. Right lung mass.  The patient was being worked up for this in an     outpatient setting.  However, he presented to the hospital prior to     completion of his work up with increased shortness of breath.  The     patient went for EBUS and biopsy as well as right VATS and PleuX     placement on Nov 16, 2010.  All surgical pathology has  been     negative for malignancy, but does indicate inflammatory     granulomatous cells.  This will continue to be worked up in the     outpatient setting.  The patient was also seen in consultation by     Infectious Disease.  The patient was felt to be not again malignant     in nature, likely granulomatous in nature, although still     questionably sarcoidosis despite a normal ACE level.  The patient     will follow up with Dr. Luciana Axe of Infectious Disease again for     further workup of nonmalignant right lung mass. 2. Recurrent right pleural effusion, again this is likely related to     right lung mass of unknown etiology.  The patient has had multiple     thoracentesis and during his right VATS on Nov 16, 2010, did have a     PleurX catheter placed by Dr. Edwyna Shell.  The patient has been     instructed to drain his PleurX catheter on Mondays and Thursdays.     The patient will have home health nursing to assist him with PleuX     drainage until the patient and family feel comfortable with this.     He will [facility] and again the patient will follow     up with Dr. Edwyna Shell as well as Dr. Sherene Sires. 3. Hypertension, it is a known problem per this patient.  This was     controlled during this hospitalization.  However, the patient has     requested to be restarted on his Caduet, amlodipine/atorvastatin.     He indicates that he was taken off of this Caduet for financial     reasons only, but feels that he tolerated this medication much     better and is willing to pay for it.  He was maintained on separate     amlodipine and atorvastatin inpatient.  The patient will follow up     with Dr. Duane Lope his primary care physician for further     evaluation and treatment of hypertension.  DISCHARGE MEDICATIONS: 1. Tylenol 650 mg p.o. q.8 h. p.r.n. 2. Atenolol 100 mg p.o. daily. 3. Caduet 5/40 mg 1 tablet p.o. daily. 4. Percocet 5/325 one to two tablets p.o. q.4 h. p.r.n. for  severe     pain #20 given, no refills. 5. Tylenol #3 one tablet p.o. q.4  h. P.r.n. 6. Tessalon Perles 200 mg p.o. t.i.d. p.r.n. for cough. 7. Fluticasone 15 mcg 2 sprays nasally daily. 8. Ibuprofen 400 mg p.o. q.6 h. p.r.n. for pain. 9. Mometasone  0.1% cream one application topically daily. 10.Multivitamin 1 tablet p.o. daily. 11.Promethazine/codeine 6.25/10 mg 5 mL q.4 h. p.r.n. for cough. 12.Ultram 50 mg 1-2 tablets  p.o. q.4 h as needed for cough or pain.  The patient has been instructed to stop taking the following medications: 1. Amlodipine 5 mg. 2. Lipitor 40 mg. 3. Atenolol/chlorthalidone.  DISCHARGE ACTIVITY:  The patient is to increase activity slowly.  No driving for 2 weeks.  DISCHARGE DIET:  Low-sodium heart-healthy diet.  WOUND CARE:  Clean with soap and water.  FOLLOWUP APPOINTMENTS: 1. The patient is to follow up with Dr. Karle Plumber in the CVTS     office with a chest x-ray 45 minutes prior to appointment.  Dr.     Scheryl Darter office will call the patient. 2. The patient is to follow up with Dr. Duane Lope his primary care     physician on November 28, 2010, at 11:30 a.m. 3. The patient is to follow with Dr. Luciana Axe at Infectious Disease     Clinic, Dr. Ephriam Knuckles office will call the patient. 4. The patient is to follow up with Dr. Sandrea Hughs in the pulmonary     office in 2 weeks.  The patient prefers to call and make this     appointment time.  DISPOSITION:  The patient has met maximum benefit from inpatient hospitalization.  He is medically cleared and ready for discharge home pending further outpatient follow up of right lung mass and recurrent right pleural effusion.     Dirk Dress, NP   ______________________________ Charlaine Dalton. Sherene Sires, MD, FCCP    KW/MEDQ  D:  11/22/2010  T:  11/22/2010  Job:  [id number]  cc:   Ines Bloomer, M.D. Magnus Sinning) Tenny Craw, M.D.  Electronically Signed by Danford Bad N.P. on 11/28/2010 02:47:23  PM Electronically Signed by Sandrea Hughs MD FCCP on 12/11/2010 12:39:29 AM

## 2010-12-13 ENCOUNTER — Encounter: Payer: Self-pay | Admitting: Internal Medicine

## 2010-12-13 ENCOUNTER — Ambulatory Visit (INDEPENDENT_AMBULATORY_CARE_PROVIDER_SITE_OTHER): Payer: BC Managed Care – PPO | Admitting: Internal Medicine

## 2010-12-13 ENCOUNTER — Ambulatory Visit (INDEPENDENT_AMBULATORY_CARE_PROVIDER_SITE_OTHER)
Admission: RE | Admit: 2010-12-13 | Discharge: 2010-12-13 | Disposition: A | Discharge status: Home / Self Care | Payer: BC Managed Care – PPO | Source: Ambulatory Visit | Attending: Internal Medicine | Admitting: Internal Medicine

## 2010-12-13 VITALS — BP 130/70 | HR 73 | Temp 98.3°F | Ht 74.0 in | Wt 194.0 lb

## 2010-12-13 DIAGNOSIS — I10 Essential (primary) hypertension: Secondary | ICD-10-CM | POA: Insufficient documentation

## 2010-12-13 DIAGNOSIS — J90 Pleural effusion, not elsewhere classified: Secondary | ICD-10-CM

## 2010-12-13 DIAGNOSIS — R059 Cough, unspecified: Secondary | ICD-10-CM | POA: Insufficient documentation

## 2010-12-13 DIAGNOSIS — R05 Cough: Secondary | ICD-10-CM

## 2010-12-13 MED ORDER — NEBIVOLOL HCL 10 MG PO TABS: ORAL_TABLET | ORAL | Status: DC

## 2010-12-13 MED ORDER — NEBIVOLOL HCL 20 MG PO TABS: ORAL_TABLET | ORAL | Status: DC

## 2010-12-13 NOTE — Assessment & Plan Note (Signed)
Due to cough will need trial off atenolol and perhaps even off amlodipine and f/u here in 4 weeks

## 2010-12-13 NOTE — Patient Instructions (Addendum)
Stop atenolol Start bystolic 10 mg one daily - if blood pressure too low (light headed standing) then stop the caduet until next visit As long as coughing you need to take prilosec 20 mg Take 30-60 min before first meal of the day and Pepcid 20 mg at bedtime GERD (REFLUX)  is an extremely common cause of respiratory symptoms, many times with no significant heartburn at all.    It can be treated with medication, but also with lifestyle changes including avoidance of late meals, excessive alcohol, smoking cessation, and avoid fatty foods, chocolate, peppermint, colas, red wine, and acidic juices such as orange juice.  NO MINT OR MENTHOL PRODUCTS SO NO COUGH DROPS  USE SUGARLESS CANDY INSTEAD (jolley ranchers or Stover's)  NO OIL BASED VITAMINS   Please schedule a follow up office visit in 4 weeks, sooner if needed

## 2010-12-13 NOTE — Assessment & Plan Note (Signed)
Not clear this is definitely related to his underlying pleural process and could be partially fueled by gerd or asthma so rec rx gerd with ppi/h2 hs and diet and try change to Bystolic, the most beta -1  selective Beta blocker available in sample form, with bisoprolol the most selective generic choice  on the market.

## 2010-12-13 NOTE — Assessment & Plan Note (Signed)
This would be very very unusual for sarcoidosis (necrotizing inflammation with large R effusion) but every dz has outliers and reasonable to consider trial of low dose steroids if ok with ID on f/u 6/26 but would not use more than 20 mg per day.

## 2010-12-13 NOTE — Progress Notes (Signed)
  Subjective:    Patient ID: Larry [name], male    DOB: Jan 04, 1951, 60 y.o.   MRN: [id number]  HPI  62 yobm  Never smoked in country since around 1990 from [place]    10/31/2010 Initial pulmonary office eval cc cough x 2 months and sob x sev weeks x fast walk,  Really not very productive. rec dx tap > improved sob for a least a week.  Non dx features of chronic exudate  11/12/2010 ov/Alden Bensinger cc sob with ex recurrent but not as severe, still dry cough.   rec vats bx  EBUS and biopsy as well as right VATS and PleuX  placement on Nov 16, 2010 >   cultures all neg, granulmatous inflammation on pleural bx's with necrosis but neg smears/cultures to date with ID f/u planned 6/26  12/13/2010 ov cc worse sob and cough since pleurex removed 6/12, sob mostly with steps/inclines, ok lying flat. Cough is dry and worse supine but breath ok flat.    Pt denies any significant sore throat, dysphagia, itching, sneezing,  nasal congestion or excess/ purulent secretions,  fever, chills, sweats, unintended wt loss, pleuritic or exertional cp, hempoptysis, orthopnea pnd or leg swelling.    Also denies any obvious fluctuation of symptoms with weather or environmental changes or other aggravating or alleviating factors.        PMHx: Hypertension Hyperlipidemia R Pleural effusion     - R tcentesis x 800 cc 10/31/2010  Exudative Lymph> segs, benign inflammatory changes on cyt     - EBUS and biopsy as well as right VATS and PleuX placement on Nov 16, 2010.    Surgery:  None   Soc Hx: Never smoked, no heavy industry  Fm Hx  HBP/  Dm mother ? Colon problem in father 5 total siblings all healthy        Objective:   Physical Exam     amb bm nad   Wt 199 10/31/2010  > 199 11/12/2010  > 194 12/13/2010  HEENT: nl dentition, turbinates, and orophanx. Nl external ear canals without cough reflex   NECK :  without JVD/Nodes/TM/ nl carotid upstrokes bilaterally   LUNGS: no acc muscle use, decreased bs  with dullness right base only, no cough on inspiration   CV:  RRR  no s3 or murmur or increase in P2, no edema   ABD:  soft and nontender with nl excursion in the supine position. No bruits or organomegaly, bowel sounds nl  MS:  warm without deformities, calf tenderness, cyanosis or clubbing  SKIN: warm and dry without lesions    cxr 12/13/2010 :   Assessment & Plan:

## 2010-12-14 NOTE — Progress Notes (Signed)
Quick Note:  Spoke with pt and notified of results per Dr. Wert. Pt verbalized understanding and denied any questions.  ______ 

## 2010-12-16 LAB — FUNGUS CULTURE W SMEAR

## 2010-12-17 ENCOUNTER — Other Ambulatory Visit: Payer: Self-pay | Admitting: Thoracic Surgery

## 2010-12-17 DIAGNOSIS — J90 Pleural effusion, not elsewhere classified: Secondary | ICD-10-CM

## 2010-12-18 ENCOUNTER — Ambulatory Visit (INDEPENDENT_AMBULATORY_CARE_PROVIDER_SITE_OTHER): Payer: BC Managed Care – PPO | Admitting: Internal Medicine

## 2010-12-18 ENCOUNTER — Encounter: Payer: Self-pay | Admitting: Internal Medicine

## 2010-12-18 ENCOUNTER — Ambulatory Visit (INDEPENDENT_AMBULATORY_CARE_PROVIDER_SITE_OTHER): Payer: Self-pay | Admitting: Thoracic Surgery

## 2010-12-18 ENCOUNTER — Ambulatory Visit
Admission: RE | Admit: 2010-12-18 | Discharge: 2010-12-18 | Disposition: A | Discharge status: Home / Self Care | Payer: BC Managed Care – PPO | Source: Ambulatory Visit | Attending: Thoracic Surgery | Admitting: Thoracic Surgery

## 2010-12-18 VITALS — BP 160/72 | HR 78 | Temp 98.1°F | Ht 74.0 in | Wt 205.0 lb

## 2010-12-18 DIAGNOSIS — J90 Pleural effusion, not elsewhere classified: Secondary | ICD-10-CM

## 2010-12-18 DIAGNOSIS — D869 Sarcoidosis, unspecified: Secondary | ICD-10-CM

## 2010-12-18 MED ORDER — PREDNISONE 20 MG PO TABS: 20.0000 mg | ORAL_TABLET | Freq: Every day | ORAL | Status: AC

## 2010-12-18 NOTE — Assessment & Plan Note (Signed)
There is no evidence of infection.  I agree with pulmonary that although sarcoidosis is rare with this presentation, it is a diagnosis of exclusion and other etiologies excluded.  Based on the note from pulmonary, I will go ahead and start him on 20 mg of prednisone and he is to follow up with pulmonary for assessment of his clinical improvement.   I did discuss that he can return to our clinic as needed or if requested by Dr. Sherene Sires again.

## 2010-12-18 NOTE — Progress Notes (Signed)
  Subjective:    Patient ID: Larry [name], male    DOB: Feb 28, 1951, 60 y.o.   MRN: [id number]  HPI here for hospital follow up for granulomas noted from open lung biopsy.  Pathology was significant for granulomas but stains negative and all cultures negative.  The patient continues to have problems with SOB but denies fever, no n/v.  No night sweats.  Some weight loss overall, but appetite good.  He has been seen by Dr. Micah Flesher of Pulmonary and discussed autoimmune/sarcoidosis.      Review of Systems  Constitutional: Negative.  Negative for appetite change.  HENT: Negative.   Eyes: Negative.   Respiratory: Positive for shortness of breath. Negative for cough and chest tightness.   Cardiovascular: Negative.   Gastrointestinal: Negative.        Objective:   Physical Exam  Constitutional: He is oriented to person, place, and time. He appears well-developed and well-nourished. No distress.  HENT:  Mouth/Throat: Oropharynx is clear and moist. No oropharyngeal exudate.  Eyes: No scleral icterus.  Neck: Normal range of motion. Neck supple.  Cardiovascular: Normal rate, regular rhythm and normal heart sounds.  Exam reveals no gallop and no friction rub.   No murmur heard. Pulmonary/Chest: Effort normal and breath sounds normal. No respiratory distress. He has no wheezes. He has no rales.  Abdominal: Soft. Bowel sounds are normal. He exhibits no distension. There is no tenderness.  Lymphadenopathy:    He has no cervical adenopathy.  Neurological: He is alert and oriented to person, place, and time.  Skin: Skin is warm and dry. No erythema.  Psychiatric: He has a normal mood and affect.          Assessment & Plan:

## 2010-12-24 ENCOUNTER — Telehealth: Payer: Self-pay | Admitting: Internal Medicine

## 2010-12-24 NOTE — Telephone Encounter (Signed)
Spoke with pt and he states wants to know if okay to change caduet to generic. I advised that this is fine, and called and spoke with pharmacist at [facility] and notified of this. Nothing further needed.

## 2010-12-24 NOTE — H&P (Signed)
NAMEELENA, COTHERN NO.:  [id number]  MEDICAL RECORD NO.:  [id number]           PATIENT TYPE:  I  LOCATION:  5529                         FACILITY:  [facility]  PHYSICIAN:  Felipa Evener, MD  DATE OF BIRTH:  1951-02-23  DATE OF ADMISSION:  11/14/2010 DATE OF DISCHARGE:                             HISTORY & PHYSICAL   CHIEF COMPLAINT:  Shortness of breath.  HISTORY OF PRESENT ILLNESS:  Mr. Sease is a 60 year old male currently being worked up for a pleural effusion who had a thoracentesis in office by Dr. Sherene Sires on Oct 31, 2010, with approximately 800 mL of tea-colored fluid which was deemed to be exudative.  No laboratory data available at this time from previous thoracentesis.  The patient was scheduled to see Dr. Edwyna Shell in the office today for possible VATS, but prior to his appointment time felt more short of breath with increased cough and presented to the emergency room prior to his appointment time and Pulmonary Critical Care was called to admit the patient.  The patient denies chest pain, hemoptysis, or leg or calf pain.  He does complain of shortness of breath, dyspnea on exertion, dry nonproductive cough and subjective fevers and chills.  ALLERGIES:  No known drug allergies.  REVIEW OF SYSTEMS:  Please see HPI.  All other systems were reviewed and were negative.  PAST MEDICAL HISTORY: 1. Hypertension. 2. Hyperlipidemia. 3. Right pleural effusion.  SURGICAL HISTORY:  None.  SOCIAL HISTORY:  The patient has never smoked.  No EtOH.  He currently lives in [place].  [place] been in the country [place] 1990 originally from [place].  FAMILY HISTORY:  Positive for high blood pressure and diabetes.  HOME MEDICATIONS:  Ultram p.r.n.  LABORATORY DATA:  CBC, white blood cells 5.54, hemoglobin 12.6, hematocrit 36.1, platelets 182.  PT 14.5, INR 1.11.  Basic metabolic panel, sodium 135, potassium 3.0, glucose 104, BUN 7, creatinine  0.80.  RADIOLOGY DATA:  Two-view chest x-ray shows moderate right pleural effusion with right lung air space disease.  PHYSICAL EXAMINATION:  GENERAL:  This is a well-developed, well- nourished male in no acute distress seen in the emergency room. NEURO:  The patient is awake and alert, oriented, cooperative, moves all extremities. HEENT: Mucous membranes are moist.  No JVD.  No lymphadenopathy.  Pupils are equal, round, reactive to light. CARDIAC:  S1 and S2, regular rate and rhythm.  No murmurs, rubs or gallops. PULMONARY:  Breaths are even and nonlabored on nasal cannula.  Lungs are essentially clear diminished in the right base with dullness to percussion. ABDOMEN:  Soft, nontender, nondistended.  Positive bowel sounds. EXTREMITIES:  Warm and dry with no edema.  IMPRESSION AND PLAN:  Right pleural effusion, previously deemed to be exudative per a thoracentesis done in the office.  The patient now presents with increased shortness of breath, cough and fevers.  Plan will be to admit the patient.  We will evaluate the effusion under ultrasound for possibility of thoracentesis.  We will also get a CT of the chest for further evaluation of thoracentesis versus bronchoscopy. Dr. Edwyna Shell has been called  for CVTS consult.  We will hold on any antibiotics for now.  Continue O2 as needed.  Tessalon Perles for cough. Should thoracentesis be indicated fluid will be sent for Gram-stain culture, protein, LDH, glucose, amylase, triglycerides, AFB and fungal.     Dirk Dress, NP   ______________________________ Felipa Evener, MD    KW/MEDQ  D:  11/14/2010  T:  11/15/2010  Job:  [id number]  cc:   Charlaine Dalton. Sherene Sires, MD, FCCP Ines Bloomer, M.D.  Electronically Signed by Danford Bad N.P. on 11/28/2010 02:47:08 PM Electronically Signed by Koren Bound MD on 12/24/2010 04:08:36 PM

## 2010-12-24 NOTE — Letter (Signed)
December 18, 2010    Re:  Larry [name], Larry [name]                 DOB:  [birth year]  [name] came today and his chest x-ray still shows reaction in his right upper lobe as well as infiltrative densities in his right upper lobe and questionable increased basilar densities.  Because all his cultures were negative, Dr. Lucianne Muss started him on prednisone.  He still has some shortness of breath and having some pain.  I will see him back again in 3 weeks with a chest x-ray.  We will keep him off work until January 23, 2011.  Ines Bloomer, M.D. Electronically Signed  DPB/MEDQ  D:  12/18/2010  T:  12/19/2010  Job:  [id number]  cc:   Leslye Peer, MD Charlaine Dalton. Sherene Sires, MD, FCCP Dr. Lucianne Muss

## 2010-12-30 LAB — AFB CULTURE WITH SMEAR (NOT AT ARMC): Acid Fast Smear: NONE SEEN

## 2011-01-08 ENCOUNTER — Ambulatory Visit (INDEPENDENT_AMBULATORY_CARE_PROVIDER_SITE_OTHER): Payer: Self-pay | Admitting: Thoracic Surgery

## 2011-01-08 ENCOUNTER — Other Ambulatory Visit: Payer: Self-pay | Admitting: Thoracic Surgery

## 2011-01-08 ENCOUNTER — Ambulatory Visit
Admission: RE | Admit: 2011-01-08 | Discharge: 2011-01-08 | Disposition: A | Discharge status: Home / Self Care | Payer: BC Managed Care – PPO | Source: Ambulatory Visit | Attending: Thoracic Surgery | Admitting: Thoracic Surgery

## 2011-01-08 DIAGNOSIS — J90 Pleural effusion, not elsewhere classified: Secondary | ICD-10-CM

## 2011-01-08 NOTE — Letter (Signed)
January 08, 2011  Casimiro Needle B. Sherene Sires, MD, FCCP [address] [place] [address]  Re:  BRADDEN, TADROS                 DOB:  February 21, 1951  Dear Lillette Boxer,  I saw the patient in the office today.  His chest x-ray was read as unchanged, there is still some infiltrate in the right upper lobe and some questionable right pleural effusion.  I actually think it is probably a little better aeration that we have had previously.  He gets some shortness of breath with exertion and there was some decreased breath sounds on the right side.  He will be seeing you Friday and I will let you decide what else you wanted do.  Since nothing cultured out, this was some type of granulomatous inflammation, I am in loss what else we need to do from therapeutic standpoint.  I will get a CT scan on him in 6 weeks.  I did tell him that he could probably go back to work since he works for [employer] and has no heavy lifting.  I will appreciate the opportunity of seeing the patient.  Sincerely,  Ines Bloomer, M.D. Electronically Signed  DPB/MEDQ  D:  01/08/2011  T:  01/08/2011  Job:  [id number]

## 2011-01-10 NOTE — Progress Notes (Signed)
  Subjective:    Patient ID: Larry [name], male    DOB: Nov 09, 1950, 60 y.o.   MRN: [id number]  HPI  60 yobm  Never smoked in country since around 1990 from [place]    10/31/2010 Initial pulmonary office eval cc cough x 2 months and sob x sev weeks x fast walk,  Really not very productive. rec dx tap > improved sob for a least a week.  Non dx features of chronic exudate  11/12/2010 ov/Kynsli Haapala cc sob with ex recurrent but not as severe, still dry cough.   rec vats bx  EBUS and biopsy as well as right VATS and PleuX  placement on Nov 16, 2010 >   cultures all neg, granulmatous inflammation on pleural bx's with necrosis but neg smears/cultures to date with ID f/u planned 6/26  12/13/2010 ov cc worse sob and cough since pleurex removed 6/12, sob mostly with steps/inclines, ok lying flat. Cough is dry and worse supine but breath ok flat. rec Stop atenolol Start bystolic 10 mg one daily - if blood pressure too low (light headed standing) then stop the caduet until next visit As long as coughing you need to take prilosec 20 mg Take 30-60 min before first meal of the day and Pepcid 20 mg at bedtime GERD (REFLUX)   12/18/10 start prednisone 20 mg per day by ID.  01/11/11 ov/Asia Favata cc cough and sob better and able to do more including steps on 20 mg per day but not back to baseline yet  Pt denies any significant sore throat, dysphagia, itching, sneezing,  nasal congestion or excess/ purulent secretions,  fever, chills, sweats, unintended wt loss, pleuritic or exertional cp, hempoptysis, orthopnea pnd or leg swelling.    Also denies any obvious fluctuation of symptoms with weather or environmental changes or other aggravating or alleviating factors.        PMHx: Hypertension Hyperlipidemia R Pleural effusion     - R tcentesis x 800 cc 10/31/2010  Exudative Lymph> segs, benign inflammatory changes on cyt     - EBUS and biopsy as well as right VATS and PleuX placement on Nov 16, 2010.       Soc  Hx: Never smoked, no heavy industry  Fm Hx  HBP/  Dm mother ? Colon problem in father 5 total siblings all healthy        Objective:   Physical Exam     amb bm nad   Wt 199 10/31/2010  > 199 11/12/2010  > 194 12/13/2010 > 200 7/2.0/12 HEENT: nl dentition, turbinates, and orophanx. Nl external ear canals without cough reflex   NECK :  without JVD/Nodes/TM/ nl carotid upstrokes bilaterally   LUNGS: no acc muscle use, decreased bs with dullness right base only, no cough on inspiration   CV:  RRR  no s3 or murmur or increase in P2, no edema   ABD:  soft and nontender with nl excursion in the supine position. No bruits or organomegaly, bowel sounds nl  MS:  warm without deformities, calf tenderness, cyanosis or clubbing  SKIN: warm and dry without lesions    01/11/11 CT chest Stable patchy opacity in the right upper/mid lung, possibly  reflecting infection versus lymphangitic spread of tumor.  Stable moderate right pleural effusion with associated atelectasis.   Assessment & Plan:

## 2011-01-11 ENCOUNTER — Encounter: Payer: Self-pay | Admitting: Internal Medicine

## 2011-01-11 ENCOUNTER — Ambulatory Visit (INDEPENDENT_AMBULATORY_CARE_PROVIDER_SITE_OTHER): Payer: BC Managed Care – PPO | Admitting: Internal Medicine

## 2011-01-11 VITALS — BP 138/80 | HR 74 | Temp 97.9°F | Ht 74.0 in | Wt 200.0 lb

## 2011-01-11 DIAGNOSIS — J90 Pleural effusion, not elsewhere classified: Secondary | ICD-10-CM

## 2011-01-11 NOTE — Patient Instructions (Signed)
Reduce prednisone to 20 mg tablet   Take one half daily once you are better to your satisfaction. We always try to reduce the prednisone to the lowest possible dose.  Sarcoidosis (the most likely diagnosis)  is a benign inflammatory condition caused by  The  immune system being too revved up like a thermostat on your furnace that's partially  stuck causing arthitis, rash, short of breath and cough and vision issues.  It typically burns itself out in 75% of patients by the end of 3 years with little to indicate that we really change the natural course of the disease by aggressive treatments intended to alter it.

## 2011-01-12 ENCOUNTER — Encounter: Payer: Self-pay | Admitting: Internal Medicine

## 2011-01-12 NOTE — Assessment & Plan Note (Signed)
Clinically he has improved significantly since started on empirical trial of steroids approved by ID in absence of demonstrable infectiion source for his inflammatory process involving lung parenchyma and pleural space.  I suspect this represents the tip of the bell shaped curve for sarcoid  The goal with a chronic steroid dependent illness is always arriving at the lowest effective dose that controls the disease/symptoms and not accepting a set "formula" which is based on statistics or guidelines that don't always take into account patient  variability or the natural hx of the dz in every individual patient, which may well vary over time.  For now therefore I recommend the patient maintain  20 mg until better then 10 mg until seen

## 2011-02-20 ENCOUNTER — Ambulatory Visit
Admission: RE | Admit: 2011-02-20 | Discharge: 2011-02-20 | Disposition: A | Discharge status: Home / Self Care | Payer: BC Managed Care – PPO | Source: Ambulatory Visit | Attending: Thoracic Surgery | Admitting: Thoracic Surgery

## 2011-02-20 ENCOUNTER — Encounter: Payer: Self-pay | Admitting: Thoracic Surgery

## 2011-02-20 ENCOUNTER — Ambulatory Visit (INDEPENDENT_AMBULATORY_CARE_PROVIDER_SITE_OTHER): Payer: Self-pay | Admitting: Thoracic Surgery

## 2011-02-20 VITALS — BP 140/79 | HR 66 | Resp 16 | Ht 72.0 in | Wt 204.0 lb

## 2011-02-20 DIAGNOSIS — D869 Sarcoidosis, unspecified: Secondary | ICD-10-CM

## 2011-02-20 DIAGNOSIS — J90 Pleural effusion, not elsewhere classified: Secondary | ICD-10-CM

## 2011-02-20 DIAGNOSIS — J99 Respiratory disorders in diseases classified elsewhere: Secondary | ICD-10-CM

## 2011-02-20 DIAGNOSIS — D860 Sarcoidosis of lung: Secondary | ICD-10-CM

## 2011-02-20 NOTE — Progress Notes (Signed)
HPI the patient returns after a followup CT scan. The CT scan shows that the right confusion has decreased in size and is mildly loculated. The multiple small nodules are also stable.These findings along with probable sarcoidosis. He will follow up with Dr.Wert. I will see him back again if he has further pulmonary problems.  Current Outpatient Prescriptions  Medication Sig Dispense Refill  . acetaminophen-codeine (TYLENOL #3) 300-30 MG per tablet Take 1 tablet by mouth every 4 (four) hours as needed.        . benzonatate (TESSALON) 200 MG capsule Take 1 capsule by mouth Three times daily as needed.      . fluticasone (FLONASE) 50 MCG/ACT nasal spray 2 sprays by Nasal route daily.        . nebivolol (BYSTOLIC) 10 MG tablet One daily  40 tablet  0  . hydrocortisone 2.5 % cream Apply 1 application topically 2 (two) times daily.        [name] Kitchen ibuprofen (ADVIL,MOTRIN) 200 MG tablet Take 200 mg by mouth every 6 (six) hours as needed.        . mometasone (ELOCON) 0.1 % cream Apply 1 application topically daily as needed.        . promethazine-codeine (PHENERGAN WITH CODEINE) 6.25-10 MG/5ML syrup Take 5 mLs by mouth Every 4 hours as needed.          Physical Exam  Cardiovascular: Normal rate and regular rhythm.   Pulmonary/Chest: He has decreased breath sounds in the right lower field.     Diagnostic tests:None   Impression: Sarcoidosis with loculated pleural effusion   Plan: Follow up with Dr. Sherene Sires

## 2011-02-22 ENCOUNTER — Encounter: Payer: Self-pay | Admitting: Internal Medicine

## 2011-02-22 ENCOUNTER — Ambulatory Visit (INDEPENDENT_AMBULATORY_CARE_PROVIDER_SITE_OTHER): Payer: BC Managed Care – PPO | Admitting: Internal Medicine

## 2011-02-22 VITALS — BP 132/72 | HR 67 | Temp 98.9°F | Ht 74.0 in | Wt 206.4 lb

## 2011-02-22 DIAGNOSIS — J90 Pleural effusion, not elsewhere classified: Secondary | ICD-10-CM

## 2011-02-22 MED ORDER — PREDNISONE 10 MG PO TABS: 10.0000 mg | ORAL_TABLET | Freq: Every day | ORAL | Status: AC

## 2011-02-22 NOTE — Progress Notes (Signed)
Subjective:    Patient ID: Larry [name], male    DOB: 08/22/1950, 60 y.o.   MRN: [id number]  HPI  34 yobm  Never smoked in country since around 1990 from [place] with very atypical ? Sarcoid/ r effusion 2012   10/31/2010 Initial pulmonary office eval cc cough x 2 months and sob x sev weeks x fast walk,  Really not very productive. Large r effusion rec dx tap > improved sob for a least a week.  Non dx features of chronic exudate  11/12/2010 ov/Wert cc sob with ex recurrent but not as severe, still dry cough.   rec vats bx  Nov 16, 2010 EBUS and biopsy as well as right VATS and PleuX  placement on  >   cultures all neg, granulmatous inflammation on pleural bx's with necrosis but neg smears/cultures to date with ID f/u planned 6/26  12/13/2010 ov cc worse sob and cough since pleurex removed 6/12, sob mostly with steps/inclines, ok lying flat. Cough is dry and worse supine but breath ok flat. rec Stop atenolol Start bystolic 10 mg one daily - if blood pressure too low (light headed standing) then stop the caduet until next visit As long as coughing you need to take prilosec 20 mg Take 30-60 min before first meal of the day and Pepcid 20 mg at bedtime GERD (REFLUX)   12/18/10 start prednisone 20 mg per day by ID.  01/11/11 ov/Wert cc cough and sob better and able to do more including steps on 20 mg per day but not back to baseline yet rec Reduce prednisone to 20 mg tablet   Take one half daily once you are better to your satisfaction. We always try to reduce the prednisone to the lowest possible dose.     02/22/2011 f/u ov/Wert cc all better, no cough or sob but did not follow instructions to lower prednisone dose and still on 20.  Pt denies any significant sore throat, dysphagia, itching, sneezing,  nasal congestion or excess/ purulent secretions,  fever, chills, sweats, unintended wt loss, pleuritic or exertional cp, hempoptysis, orthopnea pnd or leg swelling.    Also denies any  obvious fluctuation of symptoms with weather or environmental changes or other aggravating or alleviating factors.        PMHx: Hypertension Hyperlipidemia R Pleural effusion     - R tcentesis x 800 cc 10/31/2010  Exudative Lymph> segs, benign inflammatory changes on cyt     - EBUS and biopsy as well as right VATS and PleuX placement on Nov 16, 2010>  NCG with all studies neg o/w       Soc Hx: Never smoked, no heavy industry  Fm Hx  HBP/  Dm mother ? Colon problem in father 5 total siblings all healthy        Objective:   Physical Exam     amb bm nad   Wt 199 10/31/2010  > 199 11/12/2010  > 194 12/13/2010 > 200 7/2.0/12 > 206 02/22/2011   HEENT: nl dentition, turbinates, and orophanx. Nl external ear canals without cough reflex   NECK :  without JVD/Nodes/TM/ nl carotid upstrokes bilaterally   LUNGS: no acc muscle use, decreased bs with dullness right base only, no cough on inspiration   CV:  RRR  no s3 or murmur or increase in P2, no edema   ABD:  soft and nontender with nl excursion in the supine position. No bruits or organomegaly, bowel sounds nl  MS:  warm without  deformities, calf tenderness, cyanosis or clubbing  SKIN: warm and dry without lesions    01/11/11 CT chest Stable patchy opacity in the right upper/mid lung, possibly  reflecting infection versus lymphangitic spread of tumor.  Stable moderate right pleural effusion with associated atelectasis.   Assessment & Plan:

## 2011-02-22 NOTE — Assessment & Plan Note (Signed)
Apparently at least partially steroid responsive but still hard to believe this is all sarcoid.  The goal with a chronic steroid dependent illness is always arriving at the lowest effective dose that controls the disease/symptoms and not accepting a set "formula" which is based on statistics or guidelines that don't always take into account patient  variability or the natural hx of the dz in every individual patient, which may well vary over time.  For now therefore I recommend the patient maintain  A floor of 10 mg per day and a ceiling of 20 mg

## 2011-02-22 NOTE — Patient Instructions (Addendum)
Try prednisone 10 mg one daily ok to  increase to 20 mg per day if worsen cough or breathing  Please schedule a follow up office visit in 6 weeks, call sooner if needed with CXR on return.

## 2011-03-18 ENCOUNTER — Encounter (INDEPENDENT_AMBULATORY_CARE_PROVIDER_SITE_OTHER): Payer: Self-pay | Admitting: General Surgery

## 2011-03-20 ENCOUNTER — Encounter (INDEPENDENT_AMBULATORY_CARE_PROVIDER_SITE_OTHER): Payer: Self-pay | Admitting: General Surgery

## 2011-03-20 ENCOUNTER — Ambulatory Visit (INDEPENDENT_AMBULATORY_CARE_PROVIDER_SITE_OTHER): Payer: BC Managed Care – PPO | Admitting: General Surgery

## 2011-03-20 VITALS — BP 146/82 | HR 70 | Temp 97.6°F | Resp 16 | Ht 74.0 in | Wt 207.4 lb

## 2011-03-20 DIAGNOSIS — K429 Umbilical hernia without obstruction or gangrene: Secondary | ICD-10-CM

## 2011-03-20 DIAGNOSIS — M6208 Separation of muscle (nontraumatic), other site: Secondary | ICD-10-CM

## 2011-03-20 DIAGNOSIS — M6200 Separation of muscle (nontraumatic), unspecified site: Secondary | ICD-10-CM

## 2011-03-20 NOTE — Patient Instructions (Signed)
Pt was given educational handout on hernias

## 2011-03-20 NOTE — Progress Notes (Signed)
Chief Complaint  Patient presents with  . Other    new pt -eval of umbilical hernia and possible small spigellian hernia     HPI Larry [name] is a 60 y.o. male.  HPI  60 year old male referred by Dr. Tenny Craw for evaluation of umbilical hernia as well as a possible right spigelian hernia. The patient states that he started having some abdominal pain on his right side after he had his right lung surgery several months ago. He describes it as a burning sensation in his skin in his right upper quadrant underneath his rib cage. He will occasionally notice a bulge in that area as well. He cannot relate any particular activities. The burning sensation lasted for about 30-40 minutes. He denies any nausea or vomiting. He denies any fevers or chills. He denies any skin rash or lesions in that area. He denies any diarrhea. He has been having a problem with some constipation recently. He gets relief by applying an ointment to the skin in that area which relieves the burning sensation. He denies any trauma to the area.  He denies ever having a hard bulge at his umbilicus. He denies any pain at his umbilicus. He states he had a lifelong bulge at his umbilicus. He is currently on prednisone.  Past Medical History  Diagnosis Date  . Hypertension   . Multiple allergies   . Hyperlipidemia   . GERD (gastroesophageal reflux disease)   . Fever   . Nasal congestion   . Chest pain   . Abdominal pain   . Generalized headaches     Past Surgical History  Procedure Date  . Fiberoptic bronchoscopy 11/16/2010    Dr Edwyna Shell, ytology from EBUS on Nov 16, 2010, right lung   . Endobronchial ultrasound 11/16/2010  . Right vats. 11/16/2010  . Drainage of pleural effusion. 11/16/2010  . Pleural biopsies. 11/16/2010  . Insertion of pleurx catheter. 11/16/2010    Family History  Problem Relation Age of Onset  . Allergies Son     Social History History  Substance Use Topics  . Smoking status: Never Smoker   .  Smokeless tobacco: Never Used  . Alcohol Use: 0.0 oz/week     occasional    No Known Allergies  Current Outpatient Prescriptions  Medication Sig Dispense Refill  . acetaminophen-codeine (TYLENOL #3) 300-30 MG per tablet Take 1 tablet by mouth every 4 (four) hours as needed.        [name] Kitchen amLODipine (NORVASC) 5 MG tablet Take 1 tablet by mouth daily.      [name] Kitchen atorvastatin (LIPITOR) 40 MG tablet Take 1 tablet by mouth daily.      . benzonatate (TESSALON) 200 MG capsule Take 1 capsule by mouth Three times daily as needed.      . fluticasone (FLONASE) 50 MCG/ACT nasal spray 2 sprays by Nasal route daily.        . hydrocortisone 2.5 % cream Apply 1 application topically 2 (two) times daily.        [name] Kitchen ibuprofen (ADVIL,MOTRIN) 200 MG tablet Take 200 mg by mouth every 6 (six) hours as needed.        . mometasone (ELOCON) 0.1 % cream Apply 1 application topically daily as needed.        . nebivolol (BYSTOLIC) 10 MG tablet One daily  40 tablet  0  . NEXIUM 40 MG capsule Take 1 capsule by mouth daily.      [name] Kitchen Phenylephrine-Ibuprofen (ADVIL CONGESTION RELIEF PO) Per bottle directions as  needed       . predniSONE (DELTASONE) 20 MG tablet Take 10 mg by mouth daily.       . promethazine-codeine (PHENERGAN WITH CODEINE) 6.25-10 MG/5ML syrup Take 5 mLs by mouth Every 4 hours as needed.      . sildenafil (VIAGRA) 100 MG tablet Take 100 mg by mouth daily as needed.          Review of Systems Review of Systems  Constitutional: Positive for unexpected weight change (had some wt loss when had rt lung problem, but gaining wt back). Negative for fever, chills and fatigue.  HENT: Negative for congestion, neck pain and neck stiffness.   Eyes: Negative for photophobia and visual disturbance.  Respiratory: Positive for cough (essentially resolved). Negative for shortness of breath and wheezing.   Cardiovascular: Negative for chest pain, palpitations and leg swelling.  Gastrointestinal: Positive for constipation (bm about  other day). Negative for nausea, vomiting, diarrhea, blood in stool and abdominal distention.       See hpi  Genitourinary: Negative for dysuria and hematuria.  Musculoskeletal: Negative for myalgias and joint swelling.  Neurological: Negative for dizziness, seizures and light-headedness.  Hematological: Negative.   Psychiatric/Behavioral: Negative.     Blood pressure 146/82, pulse 70, temperature 97.6 F (36.4 C), resp. rate 16, height 6\' 2"  (1.88 m), weight 207 lb 6 oz (94.065 kg).  Physical Exam Physical Exam  Vitals reviewed. Constitutional: He is oriented to person, place, and time. He appears well-developed and well-nourished.  HENT:  Head: Normocephalic and atraumatic.  Eyes: Conjunctivae are normal. No scleral icterus.  Neck: Normal range of motion. Neck supple. No tracheal deviation present. No thyromegaly present.  Cardiovascular: Normal rate, regular rhythm and normal heart sounds.   Pulmonary/Chest: Effort normal and breath sounds normal. No respiratory distress. He has no wheezes.  Abdominal: Soft. Bowel sounds are normal. He exhibits no distension. There is no hepatosplenomegaly. There is no tenderness. There is no rebound and no guarding.         +diastasis rectus in upper abd midline  Musculoskeletal: Normal range of motion. He exhibits no edema.  Lymphadenopathy:    He has no cervical adenopathy.  Neurological: He is alert and oriented to person, place, and time. He exhibits normal muscle tone.  Skin: Skin is warm and dry. No rash noted.  Psychiatric: He has a normal mood and affect. His behavior is normal. Thought content normal.    Data Reviewed Dr Charlott Rakes note from 03/15/11 & 11/28/10 Dr Thurston Hole note from 9/12   Assessment    Reducible umbilical hernia Rectus diastasis    Plan    I cannot appreciate any ventral hernia on the patient other than his umbilical hernia. He does have a diastasis in his upper midline. It is possible this is what the patient is  noticing as  a bulge in his right upper quadrant.   I explained what the rectus diastasis is. I also described what a hernia is with respect to his umbilicus. He was given Agricultural engineer. We discussed signs and symptoms of incarceration and strangulation. We also discussed surgical repair. The patient is not interested in surgical repair at this time. I will see him on an as needed basis    Mary Sella. Andrey Campanile, MD   Atilano Ina 03/20/2011, 9:32 AM

## 2011-03-28 ENCOUNTER — Telehealth: Payer: Self-pay | Admitting: Internal Medicine

## 2011-03-28 NOTE — Telephone Encounter (Signed)
MW, please advise if ok for pt to get flu vac or not.  Thanks.

## 2011-03-28 NOTE — Telephone Encounter (Signed)
Ok for flu and also pneumovax if not done in last 5 years

## 2011-03-29 NOTE — Telephone Encounter (Signed)
lmomtcb  

## 2011-04-01 NOTE — Telephone Encounter (Signed)
lmomtcb  

## 2011-04-02 NOTE — Telephone Encounter (Signed)
lmomtcb  

## 2011-04-03 NOTE — Telephone Encounter (Signed)
Called, spoke with family member.  Was told pt is sleeping.  I asked to please  Inform pt Dr. Thurston Hole office called and to please have him call us back.

## 2011-04-04 ENCOUNTER — Ambulatory Visit: Payer: BC Managed Care – PPO | Admitting: Internal Medicine

## 2011-04-05 NOTE — Telephone Encounter (Signed)
Pt says he received his flu shot on Fri., 03/29/2011. He doesn't recall ever receiving a pneumovax. He is scheduled for follow-up with MW on Tues., 10/16 and can discuss with MW at that time. Immunization chart updated to reflect flu vaccine this year.

## 2011-04-08 ENCOUNTER — Ambulatory Visit: Payer: BC Managed Care – PPO | Admitting: Internal Medicine

## 2011-04-09 ENCOUNTER — Ambulatory Visit (INDEPENDENT_AMBULATORY_CARE_PROVIDER_SITE_OTHER): Payer: BC Managed Care – PPO | Admitting: Internal Medicine

## 2011-04-09 ENCOUNTER — Encounter: Payer: Self-pay | Admitting: Internal Medicine

## 2011-04-09 ENCOUNTER — Ambulatory Visit (INDEPENDENT_AMBULATORY_CARE_PROVIDER_SITE_OTHER)
Admission: RE | Admit: 2011-04-09 | Discharge: 2011-04-09 | Disposition: A | Discharge status: Home / Self Care | Payer: BC Managed Care – PPO | Source: Ambulatory Visit | Attending: Internal Medicine | Admitting: Internal Medicine

## 2011-04-09 VITALS — BP 140/72 | HR 63 | Temp 98.6°F | Ht 74.0 in | Wt 208.0 lb

## 2011-04-09 DIAGNOSIS — Z23 Encounter for immunization: Secondary | ICD-10-CM

## 2011-04-09 DIAGNOSIS — J90 Pleural effusion, not elsewhere classified: Secondary | ICD-10-CM

## 2011-04-09 DIAGNOSIS — R05 Cough: Secondary | ICD-10-CM

## 2011-04-09 DIAGNOSIS — R059 Cough, unspecified: Secondary | ICD-10-CM

## 2011-04-09 NOTE — Patient Instructions (Addendum)
Please see patient coordinator before you leave today  to schedule sinus CT  Take nexium 40 mg Take 30-60 min before first meal of the day and Pepcid 20 mg one at bedtime until next visit  GERD (REFLUX)  is an extremely common cause of respiratory symptoms, many times with no significant heartburn at all.    It can be treated with medication, but also with lifestyle changes including avoidance of late meals, excessive alcohol, smoking cessation, and avoid fatty foods, chocolate, peppermint, colas, red wine, and acidic juices such as orange juice.  NO MINT OR MENTHOL PRODUCTS SO NO COUGH DROPS  USE SUGARLESS CANDY INSTEAD (jolley ranchers or Stover's)  NO OIL BASED VITAMINS - use powdered substitutes.  Prednisone 20 mg one half even days  Only  x one month then return to office

## 2011-04-09 NOTE — Assessment & Plan Note (Signed)
Resolving slowly with steroids though very unusual pattern for sarcoid.  The goal with a chronic steroid dependent illness is always arriving at the lowest effective dose that controls the disease/symptoms and not accepting a set "formula" which is based on statistics or guidelines that don't always take into account patient  variability or the natural hx of the dz in every individual patient, which may well vary over time.  For now therefore I recommend the patient maintain  A floor of 10mg  qod for now

## 2011-04-09 NOTE — Progress Notes (Signed)
Subjective:    Patient ID: Larry [name], male    DOB: 06/17/1951, 60 y.o.   MRN: [id number]  HPI  64 yobm  Never smoked in country since around 1990 from [place] with very atypical ? Sarcoid/ r effusion 2012   10/31/2010 Initial pulmonary office eval cc cough x 2 months and sob x sev weeks x fast walk,  Really not very productive. Large r effusion rec dx tap > improved sob for a least a week.  Non dx features of chronic exudate  11/12/2010 ov/Larry [name] cc sob with ex recurrent but not as severe, still dry cough.   rec vats bx  Nov 16, 2010 EBUS and biopsy as well as right VATS and PleuX  placement on  >   cultures all neg, granulmatous inflammation on pleural bx's with necrosis but neg smears/cultures to date with ID f/u planned 6/26  12/13/2010 ov cc worse sob and cough since pleurex removed 6/12, sob mostly with steps/inclines, ok lying flat. Cough is dry and worse supine but breath ok flat. rec Stop atenolol Start bystolic 10 mg one daily - if blood pressure too low (light headed standing) then stop the caduet until next visit As long as coughing you need to take prilosec 20 mg Take 30-60 min before first meal of the day and Pepcid 20 mg at bedtime GERD (REFLUX)   12/18/10 start prednisone 20 mg per day by ID.  01/11/11 ov/Larry [name] cc cough and sob better and able to do more including steps on 20 mg per day but not back to baseline yet rec Reduce prednisone to 20 mg tablet   Take one half daily once you are better to your satisfaction. We always try to reduce the prednisone to the lowest possible dose.     02/22/2011 f/u ov/Larry [name] cc all better, no cough or sob but did not follow instructions to lower prednisone dose and still on 20. rec Try prednisone 10 mg one daily ok to  increase to 20 mg per day if worsen cough or breathing   04/09/2011 f/u ov/Larry [name]  On pred 10 mg per day cc cough gone, no sob. Some sense of nasal obstruction and throat irritation ? Worse when stopped using gerd rx  consistently.  No rash, ocular or articular co's. No sob.  Sleep ok flat still has has some discomfort at surgical incision sites.  Pt denies any significant sore throat, dysphagia, itching, sneezing,   excess/ purulent nasal  secretions,  fever, chills, sweats, unintended wt loss, pleuritic or exertional cp, hempoptysis, orthopnea pnd or leg swelling.    Also denies any obvious fluctuation of symptoms with weather or environmental changes or other aggravating or alleviating factors.        PMHx: Hypertension Hyperlipidemia R Pleural effusion     - R tcentesis x 800 cc 10/31/2010  Exudative Lymph> segs, benign inflammatory changes on cyt     - EBUS and biopsy as well as right VATS and PleuX placement on Nov 16, 2010>  NCG with all studies neg o/w       Soc Hx: Never smoked, no heavy industry  Fm Hx  HBP/  Dm mother ? Colon problem in father 5 total siblings all healthy        Objective:   Physical Exam     amb bm nad   Wt 199 10/31/2010  > 199 11/12/2010  > 206 02/22/2011 > 04/09/2011  208   HEENT: nl dentition, turbinates, and orophanx. Nl external ear canals  without cough reflex   NECK :  without JVD/Nodes/TM/ nl carotid upstrokes bilaterally   LUNGS: no acc muscle use, decreased bs with dullness right base only, no cough on inspiration   CV:  RRR  no s3 or murmur or increase in P2, no edema   ABD:  soft and nontender with nl excursion in the supine position. No bruits or organomegaly, bowel sounds nl  MS:  warm without deformities, calf tenderness, cyanosis or clubbing  SKIN: warm and dry without lesions     CXR  04/09/2011 :  .Stable loculated right pleural effusion and nodular interstitial lung disease most likely representing prior infectious granulomatous disease or sarcoidosis based on stability and reported prior biopsy results. No new findings identified.     Assessment & Plan:

## 2011-04-09 NOTE — Assessment & Plan Note (Signed)
Not really coughing but having the sensation of too much throat fullness/ drainage and nasal obst so rec  Full gerd rx x one month and check sinus ct next

## 2011-04-10 ENCOUNTER — Ambulatory Visit (INDEPENDENT_AMBULATORY_CARE_PROVIDER_SITE_OTHER)
Admission: RE | Admit: 2011-04-10 | Discharge: 2011-04-10 | Disposition: A | Discharge status: Home / Self Care | Payer: BC Managed Care – PPO | Source: Ambulatory Visit | Attending: Internal Medicine | Admitting: Internal Medicine

## 2011-04-10 DIAGNOSIS — R059 Cough, unspecified: Secondary | ICD-10-CM

## 2011-04-10 DIAGNOSIS — R05 Cough: Secondary | ICD-10-CM

## 2011-04-11 ENCOUNTER — Telehealth: Payer: Self-pay | Admitting: Internal Medicine

## 2011-04-11 NOTE — Telephone Encounter (Signed)
lmtcb

## 2011-04-12 NOTE — Telephone Encounter (Signed)
Pt stated he was returning Leslie's call & can be reached at [phone].  Antionette Fairy

## 2011-04-12 NOTE — Telephone Encounter (Signed)
Called and spoke with pt and he is aware of ct results.

## 2011-05-13 ENCOUNTER — Encounter: Payer: Self-pay | Admitting: Internal Medicine

## 2011-05-13 ENCOUNTER — Ambulatory Visit (INDEPENDENT_AMBULATORY_CARE_PROVIDER_SITE_OTHER): Payer: BC Managed Care – PPO | Admitting: Internal Medicine

## 2011-05-13 VITALS — BP 140/80 | HR 71 | Temp 98.1°F | Ht 74.0 in | Wt 212.0 lb

## 2011-05-13 DIAGNOSIS — R059 Cough, unspecified: Secondary | ICD-10-CM

## 2011-05-13 DIAGNOSIS — R05 Cough: Secondary | ICD-10-CM

## 2011-05-13 DIAGNOSIS — J90 Pleural effusion, not elsewhere classified: Secondary | ICD-10-CM

## 2011-05-13 DIAGNOSIS — J310 Chronic rhinitis: Secondary | ICD-10-CM

## 2011-05-13 MED ORDER — PREDNISONE 5 MG PO TABS: 5.0000 mg | ORAL_TABLET | Freq: Every day | ORAL | Status: DC

## 2011-05-13 MED ORDER — PANTOPRAZOLE SODIUM 40 MG PO TBEC: 40.0000 mg | DELAYED_RELEASE_TABLET | Freq: Every day | ORAL | Status: DC

## 2011-05-13 NOTE — Patient Instructions (Addendum)
Reduce prednisone to 5 mg every other day  Ok to change the nexium to generic protonix (pantoprazole) 40 mg before bfast daily  I emphasized that nasal steroids (flonase)  have no immediate benefit in terms of improving symptoms.  To help them reached the target tissue, the patient should use Afrin two puffs every 12 hours applied one min before using the nasal steroids.  Afrin should be stopped after no more than 5 days.  If the symptoms worsen, Afrin can be restarted after 5 days off of therapy to prevent rebound congestion from overuse of Afrin.  I also emphasized that in no way are nasal steroids a concern in terms of "addiction".  Please schedule a follow up office visit in 4 weeks, sooner if needed with cxr on return

## 2011-05-13 NOTE — Progress Notes (Signed)
Subjective:    Patient ID: Larry [name], male    DOB: 1950/06/25, 60 y.o.   MRN: [id number]  HPI  60 yobm  Never smoked in country since around 1990 from [place] with very atypical ? Sarcoid/ r effusion 2012   10/31/2010 Initial pulmonary office eval cc cough x 2 months and sob x sev weeks x fast walk,  Really not very productive. Large r effusion rec dx tap > improved sob for a least a week.  Non dx features of chronic exudate  11/12/2010 ov/Larry [name] cc sob with ex recurrent but not as severe, still dry cough.   rec vats bx  Nov 16, 2010 EBUS and biopsy as well as right VATS and PleuX  placement on  >   cultures all neg, granulmatous inflammation on pleural bx's with necrosis but neg smears/cultures to date with ID f/u planned 6/26  12/13/2010 ov cc worse sob and cough since pleurex removed 6/12, sob mostly with steps/inclines, ok lying flat. Cough is dry and worse supine but breath ok flat. rec Stop atenolol Start bystolic 10 mg one daily - if blood pressure too low (light headed standing) then stop the caduet until next visit As long as coughing you need to take prilosec 20 mg Take 30-60 min before first meal of the day and Pepcid 20 mg at bedtime GERD (REFLUX)   12/18/10 start prednisone 20 mg per day by ID.  01/11/11 ov/Larry [name] cc cough and sob better and able to do more including steps on 20 mg per day but not back to baseline yet rec Reduce prednisone to 20 mg tablet   Take one half daily once you are better to your satisfaction. We always try to reduce the prednisone to the lowest possible dose.   02/22/2011 f/u ov/Larry [name] cc all better, no cough or sob but did not follow instructions to lower prednisone dose and still on 20. rec Try prednisone 10 mg one daily ok to  increase to 20 mg per day if worsen cough or breathing   04/09/2011 f/u ov/Larry [name]  On pred 10 mg per day cc cough gone, no sob. Some sense of nasal obstruction and throat irritation ? Worse when stopped using gerd rx  consistently.  No rash, ocular or articular co's. No sob. rec reduce prednisone to 10 mg qod     05/13/2011 f/u ov/Larry [name] cc no flare of cough or sob with taper pred to 10 mg qod - only resp complaint is nasal obst despite use of nasal steroids x months, no sinus pain or purulent nasal secretions  Sleep ok flat still has has some discomfort at surgical incision sites.  Pt denies any significant sore throat, dysphagia, itching, sneezing,  fever, chills, sweats, unintended wt loss, pleuritic or exertional cp, hempoptysis, orthopnea pnd or leg swelling.    Also denies any obvious fluctuation of symptoms with weather or environmental changes or other aggravating or alleviating factors.        PMHx: Hypertension Hyperlipidemia R Pleural effusion     - R tcentesis x 800 cc 10/31/2010  Exudative Lymph> segs, benign inflammatory changes on cyt     - EBUS and biopsy as well as right VATS and PleuX placement on Nov 16, 2010>  NCG with all studies neg o/w       Soc Hx: Never smoked, no heavy industry  Fm Hx  HBP/  Dm mother ? Colon problem in father 5 total siblings all healthy        Objective:  Physical Exam     amb min cushingnoid  bm nad   Wt 199 10/31/2010  > 199 11/12/2010  > 206 02/22/2011 > 04/09/2011  208 > 221 05/13/11  HEENT: nl dentition, turbinates, and orophanx. Nl external ear canals without cough reflex   NECK :  without JVD/Nodes/TM/ nl carotid upstrokes bilaterally   LUNGS: no acc muscle use, decreased bs with dullness right base only, no cough on inspiration   CV:  RRR  no s3 or murmur or increase in P2, no edema   ABD:  soft and nontender with nl excursion in the supine position. No bruits or organomegaly, bowel sounds nl  MS:  warm without deformities, calf tenderness, cyanosis or clubbing  SKIN: warm and dry without lesions     CXR  04/09/2011 : Stable loculated right pleural effusion and nodular interstitial lung disease most likely representing  prior infectious granulomatous disease or sarcoidosis based on stability and reported prior biopsy results. No new findings identified.     Assessment & Plan:

## 2011-05-16 DIAGNOSIS — J310 Chronic rhinitis: Secondary | ICD-10-CM | POA: Insufficient documentation

## 2011-05-16 MED ORDER — PREDNISONE 5 MG PO TABS: ORAL_TABLET | ORAL | Status: DC

## 2011-05-16 NOTE — Assessment & Plan Note (Signed)
-  Tcentesis .10/31/2010  x 800 cc ice tea >> exudative, Lymphs > segs,  Benign inflammatory changes on cytolgy    - EBUS and biopsy as well as right VATS  Nov 16, 2010 > granulomatous changes only, all cultures neg    - Prednisone started 12/18/10>>> taper to 10 mg per day 02/22/11 > reduce to 10 qod   04/09/2011 > reduce to 5 mg qod 05/13/11  Working dx still sarcoid though this is a very unusual case and I'd prefer to just call it steroid responsive adenopathy assoc with pleural effusion on R  The goal with a chronic steroid dependent illness is always arriving at the lowest effective dose that controls the disease/symptoms and not accepting a set "formula" which is based on statistics or guidelines that don't always take into account patient  variability or the natural hx of the dz in every individual patient, which may well vary over time.  For now therefore I recommend the patient maintain  A new floor of 5 mg qod

## 2011-05-16 NOTE — Assessment & Plan Note (Addendum)
I had an extended discussion with the patient today lasting 15 to 20 minutes of a 25 minute visit on the following issues:   CT sinus reviewed 04/09/11 No sinusitis.  Approp use of nasal steroids and afrin reviewed in detail

## 2011-06-14 ENCOUNTER — Encounter: Payer: Self-pay | Admitting: Internal Medicine

## 2011-06-14 ENCOUNTER — Ambulatory Visit (INDEPENDENT_AMBULATORY_CARE_PROVIDER_SITE_OTHER): Payer: BC Managed Care – PPO | Admitting: Internal Medicine

## 2011-06-14 ENCOUNTER — Ambulatory Visit (INDEPENDENT_AMBULATORY_CARE_PROVIDER_SITE_OTHER)
Admission: RE | Admit: 2011-06-14 | Discharge: 2011-06-14 | Disposition: A | Discharge status: Home / Self Care | Payer: BC Managed Care – PPO | Source: Ambulatory Visit | Attending: Internal Medicine | Admitting: Internal Medicine

## 2011-06-14 VITALS — BP 148/80 | HR 69 | Temp 98.2°F | Ht 74.0 in | Wt 208.6 lb

## 2011-06-14 DIAGNOSIS — J90 Pleural effusion, not elsewhere classified: Secondary | ICD-10-CM

## 2011-06-14 DIAGNOSIS — J310 Chronic rhinitis: Secondary | ICD-10-CM

## 2011-06-14 NOTE — Assessment & Plan Note (Signed)
The goal with a chronic steroid dependent illness is always arriving at the lowest effective dose that controls the disease/symptoms and not accepting a set "formula" which is based on statistics or guidelines that don't always take into account patient  variability or the natural hx of the dz in every individual patient, which may well vary over time.  For now therefore I recommend the patient maintain  A floor of 5 mg qod with the option to increase to max of 10 mg per day if symptoms recur

## 2011-06-14 NOTE — Assessment & Plan Note (Signed)
I emphasized that nasal steroids have no immediate benefit in terms of improving symptoms.  To help them reached the target tissue, the patient should use Afrin two puffs every 12 hours applied one min before using the nasal steroids.  Afrin should be stopped after no more than 5 days.  If the symptoms worsen, Afrin can be restarted after 5 days off of therapy to prevent rebound congestion from overuse of Afrin.  I also emphasized that in no way are nasal steroids a concern in terms of "addiction".  

## 2011-06-14 NOTE — Patient Instructions (Signed)
If cough or breathing worse, ok to increase prednisone to 5 mg two daily until better, the one daily for a week then resume one on even days  Please schedule a follow up office visit in 6 weeks, call sooner if needed

## 2011-06-14 NOTE — Progress Notes (Signed)
Subjective:    Patient ID: Larry [name], male    DOB: 11-01-1950, 60 y.o.   MRN: [id number]  HPI  60 yobm  Never smoked in country since around 1990 from [place] with very atypical ? Sarcoid/ r effusion 2012   10/31/2010 Initial pulmonary office eval cc cough x 2 months and sob x sev weeks x fast walk,  Really not very productive. Large r effusion rec dx tap > improved sob for a least a week.  Non dx features of chronic exudate  11/12/2010 ov/Wert cc sob with ex recurrent but not as severe, still dry cough.   rec vats bx  Nov 16, 2010 EBUS and biopsy as well as right VATS and PleuX  placement on  >   cultures all neg, granulmatous inflammation on pleural bx's with necrosis but neg smears/cultures to date with ID f/u planned 6/26  12/13/2010 ov cc worse sob and cough since pleurex removed 6/12, sob mostly with steps/inclines, ok lying flat. Cough is dry and worse supine but breath ok flat. rec Stop atenolol Start bystolic 10 mg one daily - if blood pressure too low (light headed standing) then stop the caduet until next visit As long as coughing you need to take prilosec 20 mg Take 30-60 min before first meal of the day and Pepcid 20 mg at bedtime GERD (REFLUX)   12/18/10 start prednisone 20 mg per day by ID.  01/11/11 ov/Wert cc cough and sob better and able to do more including steps on 20 mg per day but not back to baseline yet rec Reduce prednisone to 20 mg tablet   Take one half daily once you are better to your satisfaction. We always try to reduce the prednisone to the lowest possible dose.   02/22/2011 f/u ov/Wert cc all better, no cough or sob but did not follow instructions to lower prednisone dose and still on 20. rec Try prednisone 10 mg one daily ok to  increase to 20 mg per day if worsen cough or breathing   04/09/2011 f/u ov/Wert  On pred 10 mg per day cc cough gone, no sob. Some sense of nasal obstruction and throat irritation ? Worse when stopped using gerd rx  consistently.  No rash, ocular or articular co's. No sob. rec reduce prednisone to 10 mg qod     05/13/2011 f/u ov/Wert cc no flare of cough or sob with taper pred to 10 mg qod - only resp complaint is nasal obst despite use of nasal steroids x months, no sinus pain or purulent nasal secretions rec Reduce prednisone to 5 mg every other day  Ok to change the nexium to generic protonix (pantoprazole) 40 mg before bfast daily  I emphasized that nasal steroids (flonase)  have no immediate benefit ivn".  Please schedule a follow up office visit in 4 weeks, sooner if needed with cxr on return   06/14/2011 f/u ov/Wert on 5mg  qod  cc increase cough assoc with watery sinus drainage x 2 days - otherwise was doing well on qod dosing, no arthralgias/ocular complaints, rash or sob.  Pt denies any significant sore throat, dysphagia, itching, sneezing,  fever, chills, sweats, unintended wt loss, pleuritic or exertional cp, hempoptysis, orthopnea pnd or leg swelling.    Also denies any obvious fluctuation of symptoms with weather or environmental changes or other aggravating or alleviating factors.        PMHx: Hypertension Hyperlipidemia R Pleural effusion     - R tcentesis x 800 cc  10/31/2010  Exudative Lymph> segs, benign inflammatory changes on cyt     - EBUS and biopsy as well as right VATS and PleuX placement on Nov 16, 2010>  NCG with all studies neg o/w       Soc Hx: Never smoked, no heavy industry  Fm Hx  HBP/  Dm mother ? Colon problem in father 5 total siblings all healthy        Objective:   Physical Exam     amb min cushingnoid  bm nad    Wt 199 10/31/2010  > 199 11/12/2010  > 206 02/22/2011 >  > 221 05/13/11 > 06/14/2011 208  HEENT: nl dentition, turbinates, and orophanx. Nl external ear canals without cough reflex   NECK :  without JVD/Nodes/TM/ nl carotid upstrokes bilaterally   LUNGS: no acc muscle use, decreased bs with dullness right base only, no cough on  inspiration   CV:  RRR  no s3 or murmur or increase in P2, no edema   ABD:  soft and nontender with nl excursion in the supine position. No bruits or organomegaly, bowel sounds nl  MS:  warm without deformities, calf tenderness, cyanosis or clubbing  SKIN: warm and dry without lesions     CXR  06/14/2011 :   Comparison: 04/09/2011  Findings: Right pleural effusion is again noted, stable. There is peribronchial thickening. Right lower lobe atelectasis or infiltrate. Left lung is clear. Heart is upper limits normal in size. No left effusion. No acute bony abnormality.  IMPRESSION: Stable exam.      Assessment & Plan:

## 2011-07-26 ENCOUNTER — Other Ambulatory Visit: Payer: Self-pay | Admitting: Allergy

## 2011-07-26 MED ORDER — PREDNISONE 5 MG PO TABS: ORAL_TABLET | ORAL | Status: DC

## 2011-07-26 MED ORDER — PANTOPRAZOLE SODIUM 40 MG PO TBEC: 40.0000 mg | DELAYED_RELEASE_TABLET | Freq: Every day | ORAL | Status: DC

## 2011-07-31 ENCOUNTER — Ambulatory Visit (INDEPENDENT_AMBULATORY_CARE_PROVIDER_SITE_OTHER): Payer: BC Managed Care – PPO | Admitting: Internal Medicine

## 2011-07-31 ENCOUNTER — Encounter: Payer: Self-pay | Admitting: Internal Medicine

## 2011-07-31 DIAGNOSIS — J310 Chronic rhinitis: Secondary | ICD-10-CM

## 2011-07-31 DIAGNOSIS — J90 Pleural effusion, not elsewhere classified: Secondary | ICD-10-CM

## 2011-07-31 MED ORDER — ACETAMINOPHEN-CODEINE #3 300-30 MG PO TABS: 1.0000 | ORAL_TABLET | ORAL | Status: DC | PRN

## 2011-07-31 NOTE — Assessment & Plan Note (Signed)
-  Tcentesis  10/31/2010  x 800 cc ice tea >> exudative, Lymphs > segs,  Benign inflammatory changes on cytolgy    - EBUS and biopsy as well as right VATS  Nov 16, 2010 > granulomatous changes only, all cultures neg    - Prednisone started 12/18/10>>> taper to 10 mg per day 02/22/11 > reduce to 10 qod   04/09/2011 > reduce to 5 mg qod 05/13/11 > reduce to 2.5 mg qod 07/31/2011   The goal with a chronic steroid dependent illness is always arriving at the lowest effective dose that controls the disease/symptoms and not accepting a set "formula" which is based on statistics or guidelines that don't always take into account patient  variability or the natural hx of the dz in every individual patient, which may well vary over time.  For now therefore I recommend the patient maintain  A floor of 2.5 mg qod and return for cxr in 6 weeks to consider stopping prednisone altogether.

## 2011-07-31 NOTE — Patient Instructions (Signed)
Try 5 mg prednisone one half on even days x 6 weeks then return for cxr, office visit sooner if worse pain, breathing or cough  When nose is more congested, try afrin then flonase twice daily x 5 days then stop afrin.

## 2011-07-31 NOTE — Progress Notes (Signed)
Subjective:    Patient ID: Larry [name], male    DOB: 02/02/1951   MRN: [id number]   Brief patient profile:  60 yobm  Never smoked in country since around 1990 from [place] with very atypical ? Sarcoid/ r effusion 2012  HPI 10/31/2010 Initial pulmonary office eval cc cough x 2 months and sob x sev weeks x fast walk,  Really not very productive. Large r effusion rec dx tap > improved sob for a least a week.  Non dx features of chronic exudate  11/12/2010 ov/Wert cc sob with ex recurrent but not as severe, still dry cough.   rec vats bx  Nov 16, 2010 EBUS and biopsy as well as right VATS and PleuX  placement on  >   cultures all neg, granulmatous inflammation on pleural bx's with necrosis but neg smears/cultures to date with ID f/u planned 6/26  12/13/2010 ov cc worse sob and cough since pleurex removed 6/12, sob mostly with steps/inclines, ok lying flat. Cough is dry and worse supine but breath ok flat. rec Stop atenolol Start bystolic 10 mg one daily - if blood pressure too low (light headed standing) then stop the caduet until next visit As long as coughing you need to take prilosec 20 mg Take 30-60 min before first meal of the day and Pepcid 20 mg at bedtime GERD (REFLUX)   12/18/10 start prednisone 20 mg per day by ID.  01/11/11 ov/Wert cc cough and sob better and able to do more including steps on 20 mg per day but not back to baseline yet rec Reduce prednisone to 20 mg tablet   Take one half daily once you are better to your satisfaction. We always try to reduce the prednisone to the lowest possible dose.   02/22/2011 f/u ov/Wert cc all better, no cough or sob but did not follow instructions to lower prednisone dose and still on 20. rec Try prednisone 10 mg one daily ok to  increase to 20 mg per day if worsen cough or breathing   04/09/2011 f/u ov/Wert  On pred 10 mg per day cc cough gone, no sob. Some sense of nasal obstruction and throat irritation ? Worse when stopped using  gerd rx consistently.  No rash, ocular or articular co's. No sob. rec reduce prednisone to 10 mg qod     05/13/2011 f/u ov/Wert cc no flare of cough or sob with taper pred to 10 mg qod - only resp complaint is nasal obst despite use of nasal steroids x months, no sinus pain or purulent nasal secretions rec Reduce prednisone to 5 mg every other day  Ok to change the nexium to generic protonix (pantoprazole) 40 mg before bfast daily  I emphasized that nasal steroids (flonase)  have no immediate benefit ivn".  Please schedule a follow up office visit in 4 weeks, sooner if needed with cxr on return   06/14/2011 f/u ov/Wert on 5mg  qod  cc increase cough assoc with watery sinus drainage x 2 days - otherwise was doing well on qod dosing, no arthralgias/ocular complaints, rash or sob. rec If cough or breathing worse, ok to increase prednisone to 5 mg two daily until better, the one daily for a week then resume one on even days    07/31/2011 f/u ov/Wert weaned prednisone back to 5 mg every other day s flare of cough, minimal sinus drainage. Sob when bends only. No ocurlar or articular symptoms or rash.  Pt denies any significant sore throat, dysphagia,  itching, sneezing,  fever, chills, sweats, unintended wt loss, pleuritic or exertional cp, hempoptysis, orthopnea pnd or leg swelling.    Also denies any obvious fluctuation of symptoms with weather or environmental changes or other aggravating or alleviating factors.        PMHx: Hypertension Hyperlipidemia R Pleural effusion     - R tcentesis x 800 cc 10/31/2010  Exudative Lymph> segs, benign inflammatory changes on cyt     - EBUS and biopsy as well as right VATS and PleuX placement on Nov 16, 2010>  NCG with all studies neg o/w       Soc Hx: Never smoked, no heavy industry  Fm Hx  HBP/  Dm mother ? Colon problem in father 5 total siblings all healthy        Objective:   Physical Exam     amb mod  cushingnoid  bm nad    Wt 199 10/31/2010  >  221 05/13/11 > 06/14/2011 208> 07/31/2011  209  HEENT: nl dentition, turbinates, and orophanx. Nl external ear canals without cough reflex   NECK :  without JVD/Nodes/TM/ nl carotid upstrokes bilaterally   LUNGS: no acc muscle use, decreased bs with dullness right base only, no cough on inspiration   CV:  RRR  no s3 or murmur or increase in P2, no edema   ABD:  soft and nontender with nl excursion in the supine position. No bruits or organomegaly, bowel sounds nl  MS:  warm without deformities, calf tenderness, cyanosis or clubbing  SKIN: warm and dry without lesions     CXR  06/14/2011 :   Comparison: 04/09/2011  Findings: Right pleural effusion is again noted, stable. There is peribronchial thickening. Right lower lobe atelectasis or infiltrate. Left lung is clear. Heart is upper limits normal in size. No left effusion. No acute bony abnormality.  IMPRESSION: Stable exam.      Assessment & Plan:

## 2011-07-31 NOTE — Assessment & Plan Note (Signed)
-   Sinus CT 04/09/11 > No sinusitis.   - reviewed use of afrin 05/14/11  Warned sinus symptoms may flare as taper off prednisone so need to be consistent about topical dosing and helping the flonase reaching the target tissue with up to 5 days of afrin pre-flonase only  See instructions for specific recommendations which were reviewed directly with the patient who was given a copy with highlighter outlining the key components.

## 2011-08-01 ENCOUNTER — Other Ambulatory Visit: Payer: Self-pay | Admitting: *Deleted

## 2011-08-01 MED ORDER — PREDNISONE 5 MG PO TABS: ORAL_TABLET | ORAL | Status: DC

## 2011-08-01 MED ORDER — PANTOPRAZOLE SODIUM 40 MG PO TBEC: 40.0000 mg | DELAYED_RELEASE_TABLET | Freq: Every day | ORAL | Status: DC

## 2011-09-18 ENCOUNTER — Ambulatory Visit (INDEPENDENT_AMBULATORY_CARE_PROVIDER_SITE_OTHER): Payer: BC Managed Care – PPO | Admitting: Internal Medicine

## 2011-09-18 ENCOUNTER — Encounter: Payer: Self-pay | Admitting: Internal Medicine

## 2011-09-18 ENCOUNTER — Ambulatory Visit (INDEPENDENT_AMBULATORY_CARE_PROVIDER_SITE_OTHER)
Admission: RE | Admit: 2011-09-18 | Discharge: 2011-09-18 | Disposition: A | Discharge status: Home / Self Care | Payer: BC Managed Care – PPO | Source: Ambulatory Visit | Attending: Internal Medicine | Admitting: Internal Medicine

## 2011-09-18 VITALS — BP 136/78 | HR 63 | Temp 98.4°F | Ht 74.0 in | Wt 214.4 lb

## 2011-09-18 DIAGNOSIS — J90 Pleural effusion, not elsewhere classified: Secondary | ICD-10-CM

## 2011-09-18 DIAGNOSIS — J310 Chronic rhinitis: Secondary | ICD-10-CM

## 2011-09-18 NOTE — Assessment & Plan Note (Addendum)
-   Sinus CT 04/09/11 > No sinusitis.   - reviewed use of afrin 05/14/11  Mainly having unilateral (R) sided obst symptoms > should only use the afrin on the obst side, and only for 5 days, and only immediately before flonase, then to ENT if not satisfied

## 2011-09-18 NOTE — Assessment & Plan Note (Signed)
-  Tcentesis  10/31/2010  x 800 cc ice tea >> exudative, Lymphs > segs,  Benign inflammatory changes on cytolgy    - EBUS and biopsy as well as right VATS  Nov 16, 2010 > granulomatous changes only, all cultures neg    - Prednisone started 12/18/10>>> taper to 10 mg per day 02/22/11 > reduce to 10 qod   04/09/2011 > reduce to 5 mg qod 05/13/11 > reduce to 2.5 mg qod 07/31/2011 > d/c 09/18/2011   Still not sure what to call this but tolerating taper prednisone > try reduce to 0 now that he's just barely above a physiologic dose anyway

## 2011-09-18 NOTE — Patient Instructions (Signed)
Stop the prednisone completely unless the symptoms you had before come back, then just restart the one half every day.  Please schedule a follow up office visit in 4 weeks, sooner if needed for CXR

## 2011-09-18 NOTE — Progress Notes (Signed)
Subjective:    Patient ID: Larry [name], male    DOB: [birth year]   MRN: [id number]   Brief patient profile:  60 yobm  Never smoked in country since around 1990 from [place] with very atypical ? Sarcoid/ r effusion 2012  HPI 10/31/2010 Initial pulmonary office eval cc cough x 2 months and sob x sev weeks x fast walk,  Really not very productive. Large r effusion rec dx tap > improved sob for a least a week.  Non dx features of chronic exudate  11/12/2010 ov/Larry [name] cc sob with ex recurrent but not as severe, still dry cough.   rec vats bx  Nov 16, 2010 EBUS and biopsy as well as right VATS and PleuX  placement on  >   cultures all neg, granulmatous inflammation on pleural bx's with necrosis but neg smears/cultures to date with ID f/u planned 6/26  12/13/2010 ov cc worse sob and cough since pleurex removed 6/12, sob mostly with steps/inclines, ok lying flat. Cough is dry and worse supine but breath ok flat. rec Stop atenolol Start bystolic 10 mg one daily - if blood pressure too low (light headed standing) then stop the caduet until next visit As long as coughing you need to take prilosec 20 mg Take 30-60 min before first meal of the day and Pepcid 20 mg at bedtime GERD (REFLUX)   12/18/10 start prednisone 20 mg per day by ID.  01/11/11 ov/Larry [name] cc cough and sob better and able to do more including steps on 20 mg per day but not back to baseline yet rec Reduce prednisone to 20 mg tablet   Take one half daily once you are better to your satisfaction. We always try to reduce the prednisone to the lowest possible dose.   02/22/2011 f/u ov/Larry [name] cc all better, no cough or sob but did not follow instructions to lower prednisone dose and still on 20. rec Try prednisone 10 mg one daily ok to  increase to 20 mg per day if worsen cough or breathing   04/09/2011 f/u ov/Larry [name]  On pred 10 mg per day cc cough gone, no sob. Some sense of nasal obstruction and throat irritation ? Worse when stopped using  gerd rx consistently.  No rash, ocular or articular co's. No sob. rec reduce prednisone to 10 mg qod     05/13/2011 f/u ov/Larry [name] cc no flare of cough or sob with taper pred to 10 mg qod - only resp complaint is nasal obst despite use of nasal steroids x months, no sinus pain or purulent nasal secretions rec Reduce prednisone to 5 mg every other day  Ok to change the nexium to generic protonix (pantoprazole) 40 mg before bfast daily  I emphasized that nasal steroids (flonase)  have no immediate benefit ivn".  Please schedule a follow up office visit in 4 weeks, sooner if needed with cxr on return   06/14/2011 f/u ov/Larry [name] on 5mg  qod  cc increase cough assoc with watery sinus drainage x 2 days - otherwise was doing well on qod dosing, no arthralgias/ocular complaints, rash or sob. rec If cough or breathing worse, ok to increase prednisone to 5 mg two daily until better, the one daily for a week then resume one on even days    07/31/2011 f/u ov/Larry [name] weaned prednisone back to 5 mg every other day s flare of cough, minimal sinus drainage. Sob when bends only. No ocurlar or articular symptoms or rash. rec Try 5 mg prednisone one half on  even days x 6 weeks then return for cxr, office visit sooner if worse pain, breathing or cough  When nose is more congested, try afrin then flonase twice daily x 5 days then stop afrin.  09/18/2011 f/u ov/Larry [name] cc main c/o is nasal congestion much better p afrin vs flonase alone, mostly R side with neg sinus ct noted 03/2011. No sob, cough, no problem with the 2.5 mg qod dosing of pred nor arthralgias/ rash.  Pt denies any significant sore throat, dysphagia, itching, sneezing,  fever, chills, sweats, unintended wt loss, pleuritic or exertional cp, hempoptysis, orthopnea pnd or leg swelling.    Also denies any obvious fluctuation of symptoms with weather or environmental changes or other aggravating or alleviating factors.         PMHx: Hypertension Hyperlipidemia R Pleural effusion     - R tcentesis x 800 cc 10/31/2010  Exudative Lymph> segs, benign inflammatory changes on cyt     - EBUS and biopsy as well as right VATS and PleuX placement on Nov 16, 2010>  NCG with all studies neg o/w       Soc Hx: Never smoked, no heavy industry  Fm Hx  HBP/  Dm mother ? Colon problem in father 5 total siblings all healthy        Objective:   Physical Exam     amb mod  cushingnoid  bm nad   Wt 199 10/31/2010  >  221 05/13/11 > 06/14/2011 208> 07/31/2011  209 > 09/18/2011  214  HEENT: nl dentition, turbinates, and orophanx. Nl external ear canals without cough reflex   NECK :  without JVD/Nodes/TM/ nl carotid upstrokes bilaterally   LUNGS: no acc muscle use, decreased bs with dullness right base only, no cough on inspiration   CV:  RRR  no s3 or murmur or increase in P2, no edema   ABD:  soft and nontender with nl excursion in the supine position. No bruits or organomegaly, bowel sounds nl  MS:  warm without deformities, calf tenderness, cyanosis or clubbing  SKIN: warm and dry without lesions     CXR  09/18/2011 :  There is stable blunting of the right lateral and posterior costophrenic angles with pleural thickening or effusion. The thickening of the minor fissure also appears stable. There is associated basilar atelectasis or fibrosis. Left lung is free of infiltrates. No left pleural effusion is seen     Assessment & Plan:

## 2011-10-15 ENCOUNTER — Other Ambulatory Visit: Payer: Self-pay | Admitting: Internal Medicine

## 2011-10-15 DIAGNOSIS — J90 Pleural effusion, not elsewhere classified: Secondary | ICD-10-CM

## 2011-10-16 ENCOUNTER — Ambulatory Visit (INDEPENDENT_AMBULATORY_CARE_PROVIDER_SITE_OTHER): Payer: BC Managed Care – PPO | Admitting: Internal Medicine

## 2011-10-16 ENCOUNTER — Encounter: Payer: Self-pay | Admitting: Internal Medicine

## 2011-10-16 ENCOUNTER — Ambulatory Visit (INDEPENDENT_AMBULATORY_CARE_PROVIDER_SITE_OTHER)
Admission: RE | Admit: 2011-10-16 | Discharge: 2011-10-16 | Disposition: A | Discharge status: Home / Self Care | Payer: BC Managed Care – PPO | Source: Ambulatory Visit | Attending: Internal Medicine | Admitting: Internal Medicine

## 2011-10-16 VITALS — BP 138/68 | HR 64 | Temp 98.0°F | Ht 74.0 in | Wt 213.4 lb

## 2011-10-16 DIAGNOSIS — J90 Pleural effusion, not elsewhere classified: Secondary | ICD-10-CM

## 2011-10-16 NOTE — Patient Instructions (Signed)
Please schedule a follow up visit in 3 months but call sooner if needed with cxr  

## 2011-10-16 NOTE — Progress Notes (Signed)
Subjective:    Patient ID: Larry [name], male    DOB: 07-16-1950   MRN: [id number]   Brief patient profile:  60 yobm  Never smoked in country since around 1990 from [place] with very atypical ? Sarcoid/ r effusion 2012  HPI 10/31/2010 Initial pulmonary office eval cc cough x 2 months and sob x sev weeks x fast walk,  Really not very productive. Large r effusion rec dx tap > improved sob for a least a week.  Non dx features of chronic exudate  11/12/2010 ov/Sharlet Notaro cc sob with ex recurrent but not as severe, still dry cough.   rec vats bx  Nov 16, 2010 EBUS and biopsy as well as right VATS and PleuX  placement on  >   cultures all neg, granulmatous inflammation on pleural bx's with necrosis but neg smears/cultures to date with ID f/u planned 6/26  12/13/2010 ov cc worse sob and cough since pleurex removed 6/12, sob mostly with steps/inclines, ok lying flat. Cough is dry and worse supine but breath ok flat. rec Stop atenolol Start bystolic 10 mg one daily - if blood pressure too low (light headed standing) then stop the caduet until next visit As long as coughing you need to take prilosec 20 mg Take 30-60 min before first meal of the day and Pepcid 20 mg at bedtime GERD (REFLUX)   12/18/10 start prednisone 20 mg per day by ID.  01/11/11 ov/Gloria Lambertson cc cough and sob better and able to do more including steps on 20 mg per day but not back to baseline yet rec Reduce prednisone to 20 mg tablet   Take one half daily once you are better to your satisfaction. We always try to reduce the prednisone to the lowest possible dose.   02/22/2011 f/u ov/Devonne Lalani cc all better, no cough or sob but did not follow instructions to lower prednisone dose and still on 20. rec Try prednisone 10 mg one daily ok to  increase to 20 mg per day if worsen cough or breathing   04/09/2011 f/u ov/Saarah Dewing  On pred 10 mg per day cc cough gone, no sob. Some sense of nasal obstruction and throat irritation ? Worse when stopped using  gerd rx consistently.  No rash, ocular or articular co's. No sob. rec reduce prednisone to 10 mg qod     05/13/2011 f/u ov/Nanetta Wiegman cc no flare of cough or sob with taper pred to 10 mg qod - only resp complaint is nasal obst despite use of nasal steroids x months, no sinus pain or purulent nasal secretions rec Reduce prednisone to 5 mg every other day  Ok to change the nexium to generic protonix (pantoprazole) 40 mg before bfast daily  I emphasized that nasal steroids (flonase)  have no immediate benefit ivn".  Please schedule a follow up office visit in 4 weeks, sooner if needed with cxr on return   06/14/2011 f/u ov/Rashawn Rolon on 5mg  qod  cc increase cough assoc with watery sinus drainage x 2 days - otherwise was doing well on qod dosing, no arthralgias/ocular complaints, rash or sob. rec If cough or breathing worse, ok to increase prednisone to 5 mg two daily until better, the one daily for a week then resume one on even days    07/31/2011 f/u ov/Correy Weidner weaned prednisone back to 5 mg every other day s flare of cough, minimal sinus drainage. Sob when bends only. No ocurlar or articular symptoms or rash. rec Try 5 mg prednisone one half on  even days x 6 weeks then return for cxr, office visit sooner if worse pain, breathing or cough  When nose is more congested, try afrin then flonase twice daily x 5 days then stop afrin.  09/18/2011 f/u ov/Nikoloz Huy cc main c/o is nasal congestion much better p afrin vs flonase alone, mostly R side with neg sinus ct noted 03/2011. No sob, cough, no problem with the 2.5 mg qod dosing of pred nor arthralgias/ rash. rec Stop the prednisone completely unless the symptoms you had before come back, then just restart the one half every day.    10/16/2011 f/u ov/Labria Wos cc stopped prednisone one week prior to OV   No cough or sob. No fatigue or nausea.  Pt denies any significant sore throat, dysphagia, itching, sneezing,  fever, chills, sweats, unintended wt loss, pleuritic or  exertional cp, hempoptysis, orthopnea pnd or leg swelling.    Also denies any obvious fluctuation of symptoms with weather or environmental changes or other aggravating or alleviating factors.        PMHx: Hypertension Hyperlipidemia R Pleural effusion     - R tcentesis x 800 cc 10/31/2010  Exudative Lymph> segs, benign inflammatory changes on cyt     - EBUS and biopsy as well as right VATS and PleuX placement on Nov 16, 2010>  NCG with all studies neg o/w       Soc Hx: Never smoked, no heavy industry  Fm Hx  HBP/  Dm mother ? Colon problem in father 5 total siblings all healthy        Objective:   Physical Exam     amb mod  cushingnoid  bm nad   Wt 199 10/31/2010  >  221 05/13/11 >  09/18/2011  214> 10/16/2011  213  HEENT: nl dentition, turbinates, and orophanx. Nl external ear canals without cough reflex   NECK :  without JVD/Nodes/TM/ nl carotid upstrokes bilaterally   LUNGS: no acc muscle use, decreased bs with dullness right base only, no cough on inspiration   CV:  RRR  no s3 or murmur or increase in P2, no edema   ABD:  soft and nontender with nl excursion in the supine position. No bruits or organomegaly, bowel sounds nl  MS:  warm without deformities, calf tenderness, cyanosis or clubbing  SKIN: warm and dry without lesions     CXR  10/16/2011 :   Moderate right pleural effusion with overlying atelectasis is  unchanged from previous examination.     Assessment & Plan:

## 2011-10-17 NOTE — Assessment & Plan Note (Signed)
-  Tcentesis  10/31/2010  x 800 cc ice tea >> exudative, Lymphs > segs,  Benign inflammatory changes on cytolgy    - EBUS and biopsy as well as right VATS  Nov 16, 2010 > granulomatous changes only, all cultures neg    - Prednisone started 12/18/10>>> taper to 10 mg per day 02/22/11 > reduce to 10 qod   04/09/2011 > reduce to 5 mg qod 05/13/11 > reduce to 2.5 mg qod 07/31/2011 > d/c 09/18/2011   So far tolerating off prednisone without flare of symptoms or signs/symptoms of adrenal insufficiency, reviewed with pt in detail:  The goal with a chronic steroid dependent illness is always arriving at the lowest effective dose that controls the disease/symptoms and not accepting a set "formula" which is based on statistics or guidelines that don't always take into account patient  variability or the natural hx of the dz in every individual patient, which may well vary over time.  For now therefore I recommend the patient maintain  A floor of zero if possible

## 2012-01-13 ENCOUNTER — Other Ambulatory Visit: Payer: Self-pay | Admitting: Internal Medicine

## 2012-01-13 DIAGNOSIS — J90 Pleural effusion, not elsewhere classified: Secondary | ICD-10-CM

## 2012-01-14 ENCOUNTER — Ambulatory Visit (INDEPENDENT_AMBULATORY_CARE_PROVIDER_SITE_OTHER)
Admission: RE | Admit: 2012-01-14 | Discharge: 2012-01-14 | Disposition: A | Discharge status: Home / Self Care | Payer: BC Managed Care – PPO | Source: Ambulatory Visit | Attending: Internal Medicine | Admitting: Internal Medicine

## 2012-01-14 ENCOUNTER — Ambulatory Visit (INDEPENDENT_AMBULATORY_CARE_PROVIDER_SITE_OTHER): Payer: BC Managed Care – PPO | Admitting: Internal Medicine

## 2012-01-14 ENCOUNTER — Encounter: Payer: Self-pay | Admitting: Internal Medicine

## 2012-01-14 VITALS — BP 122/72 | HR 76 | Temp 98.2°F | Ht 73.0 in | Wt 211.0 lb

## 2012-01-14 DIAGNOSIS — G8912 Acute post-thoracotomy pain: Secondary | ICD-10-CM

## 2012-01-14 DIAGNOSIS — J90 Pleural effusion, not elsewhere classified: Secondary | ICD-10-CM

## 2012-01-14 MED ORDER — ACETAMINOPHEN-CODEINE #3 300-30 MG PO TABS: 1.0000 | ORAL_TABLET | ORAL | Status: DC | PRN

## 2012-01-14 NOTE — Assessment & Plan Note (Signed)
Classic post thoracotomy pattern - I had not realized he was still using narcotics for this > try zostrix

## 2012-01-14 NOTE — Assessment & Plan Note (Signed)
-  Tcentesis  10/31/2010  x 800 cc ice tea >> exudative, Lymphs > segs,  Benign inflammatory changes on cytolgy    - EBUS and biopsy as well as right VATS  Nov 16, 2010 > granulomatous changes only, all cultures neg    - Prednisone started 12/18/10>>> taper to 10 mg per day 02/22/11 > reduce to 10 qod   04/09/2011 > reduce to 5 mg qod 05/13/11 > reduce to 2.5 mg qod 07/31/2011 > d/c 09/18/2011   No evidence recurrence off steroids. Will f/u q 3 months off rx

## 2012-01-14 NOTE — Patient Instructions (Addendum)
Zostrix cream apply 4 x daily until better, then just at bedtime  Only use the Tylenol # 3 if needed for intolerable pain as it does not actually address the cause of the problem  Please schedule a follow up visit in 3 months but call sooner if needed with cxr

## 2012-01-14 NOTE — Progress Notes (Signed)
Subjective:    Patient ID: Larry [name], male    DOB: [birth year]   MRN: [id number]   Brief patient profile:  61 yobm  Never smoked in country since around 1990 from [place] with very atypical ? Sarcoid/ r effusion 2012  HPI 10/31/2010 Initial pulmonary office eval cc cough x 2 months and sob x sev weeks x fast walk,  Really not very productive. Large r effusion rec dx tap > improved sob for a least a week.  Non dx features of chronic exudate  11/12/2010 ov/Larry [name] cc sob with ex recurrent but not as severe, still dry cough.   rec vats bx  Nov 16, 2010 EBUS and biopsy as well as right VATS and PleuX  placement on  >   cultures all neg, granulmatous inflammation on pleural bx's with necrosis but neg smears/cultures to date with ID f/u planned 6/26  12/13/2010 ov cc worse sob and cough since pleurex removed 6/12, sob mostly with steps/inclines, ok lying flat. Cough is dry and worse supine but breath ok flat. rec Stop atenolol Start bystolic 10 mg one daily - if blood pressure too low (light headed standing) then stop the caduet until next visit As long as coughing you need to take prilosec 20 mg Take 30-60 min before first meal of the day and Pepcid 20 mg at bedtime GERD (REFLUX)   12/18/10 start prednisone 20 mg per day by ID.  01/11/11 ov/Larry [name] cc cough and sob better and able to do more including steps on 20 mg per day but not back to baseline yet rec Reduce prednisone to 20 mg tablet   Take one half daily once you are better to your satisfaction. We always try to reduce the prednisone to the lowest possible dose.   02/22/2011 f/u ov/Larry [name] cc all better, no cough or sob but did not follow instructions to lower prednisone dose and still on 20. rec Try prednisone 10 mg one daily ok to  increase to 20 mg per day if worsen cough or breathing   04/09/2011 f/u ov/Larry [name]  On pred 10 mg per day cc cough gone, no sob. Some sense of nasal obstruction and throat irritation ? Worse when stopped using  gerd rx consistently.  No rash, ocular or articular co's. No sob. rec reduce prednisone to 10 mg qod     05/13/2011 f/u ov/Larry [name] cc no flare of cough or sob with taper pred to 10 mg qod - only resp complaint is nasal obst despite use of nasal steroids x months, no sinus pain or purulent nasal secretions rec Reduce prednisone to 5 mg every other day  Ok to change the nexium to generic protonix (pantoprazole) 40 mg before bfast daily  I emphasized that nasal steroids (flonase)  have no immediate benefit ivn".  Please schedule a follow up office visit in 4 weeks, sooner if needed with cxr on return   06/14/2011 f/u ov/Larry [name] on 5mg  qod  cc increase cough assoc with watery sinus drainage x 2 days - otherwise was doing well on qod dosing, no arthralgias/ocular complaints, rash or sob. rec If cough or breathing worse, ok to increase prednisone to 5 mg two daily until better, the one daily for a week then resume one on even days    07/31/2011 f/u ov/Larry [name] weaned prednisone back to 5 mg every other day s flare of cough, minimal sinus drainage. Sob when bends only. No ocurlar or articular symptoms or rash. rec Try 5 mg prednisone one half on  even days x 6 weeks then return for cxr, office visit sooner if worse pain, breathing or cough  When nose is more congested, try afrin then flonase twice daily x 5 days then stop afrin.  09/18/2011 f/u ov/Larry [name] cc main c/o is nasal congestion much better p afrin vs flonase alone, mostly R side with neg sinus ct noted 03/2011. No sob, cough, no problem with the 2.5 mg qod dosing of pred nor arthralgias/ rash. rec Stop the prednisone completely unless the symptoms you had before come back, then just restart the one half every day.    10/16/2011 f/u ov/Larry [name] cc stopped prednisone one week prior to OV   No cough or sob. No fatigue or nausea. rec No change rx  6/6 -18th 2013 took prednisone for back/L leg pain    01/14/2012 f/u ov/Larry [name] cc overall feel better, occ  cough with min discolored sputum. No sob, articular or ocular complaints or rash- only problem is persistent positional pain along distribution of rib where underwent vats, still using tylenol #3 at hs several times a week   Sleeping ok without nocturnal  or early am exacerbation  of respiratory  c/o's or need for noct saba. Also denies any obvious fluctuation of symptoms with weather or environmental changes or other aggravating or alleviating factors except as outlined above   ROS  The following are not active complaints unless bolded sore throat, dysphagia, dental problems, itching, sneezing,  nasal congestion or excess/ purulent secretions, ear ache,   fever, chills, sweats, unintended wt loss, pleuritic or exertional cp, hemoptysis,  orthopnea pnd or leg swelling, presyncope, palpitations, heartburn, abdominal pain, anorexia, nausea, vomiting, diarrhea  or change in bowel or urinary habits, change in stools or urine, dysuria,hematuria,  rash, arthralgias, visual complaints, headache, numbness weakness or ataxia or problems with walking or coordination,  change in mood/affect or memory.          PMHx: Hypertension Hyperlipidemia R Pleural effusion     - R tcentesis x 800 cc 10/31/2010  Exudative Lymph> segs, benign inflammatory changes on cyt     - EBUS and biopsy as well as right VATS and PleuX placement on Nov 16, 2010>  NCG with all studies neg o/w       Soc Hx: Never smoked, no heavy industry  Fm Hx  HBP/  Dm mother ? Colon problem in father 5 total siblings all healthy        Objective:   Physical Exam     amb mod  cushingnoid  bm nad   Wt 199 10/31/2010  >  221 05/13/11 >  09/18/2011  214> 10/16/2011  213 > 01/14/2012  211  HEENT: nl dentition, turbinates, and orophanx. Nl external ear canals without cough reflex   NECK :  without JVD/Nodes/TM/ nl carotid upstrokes bilaterally   LUNGS: no acc muscle use, decreased bs with dullness right base only, no cough on  inspiration   CV:  RRR  no s3 or murmur or increase in P2, no edema   ABD:  soft and nontender with nl excursion in the supine position. No bruits or organomegaly, bowel sounds nl  MS:  warm without deformities, calf tenderness, cyanosis or clubbing  SKIN: warm and dry without lesions     CXR  01/14/2012 :  Small to moderate right pleural effusion decreased in size from prior exam. Thickening of the right minor fissure again noted. Left lung is clear. No pulmonary edema. Stable right basilar atelectasis.  Assessment & Plan:

## 2012-02-07 IMAGING — CR DG CHEST 2V
2 series · 2 of 2 positions shown · non-contrast
Comparison: 10/31/2010

CLINICAL DATA: Cough, pleural effusion.

CHEST - 2 VIEW

[view not recorded (1 of 2)]
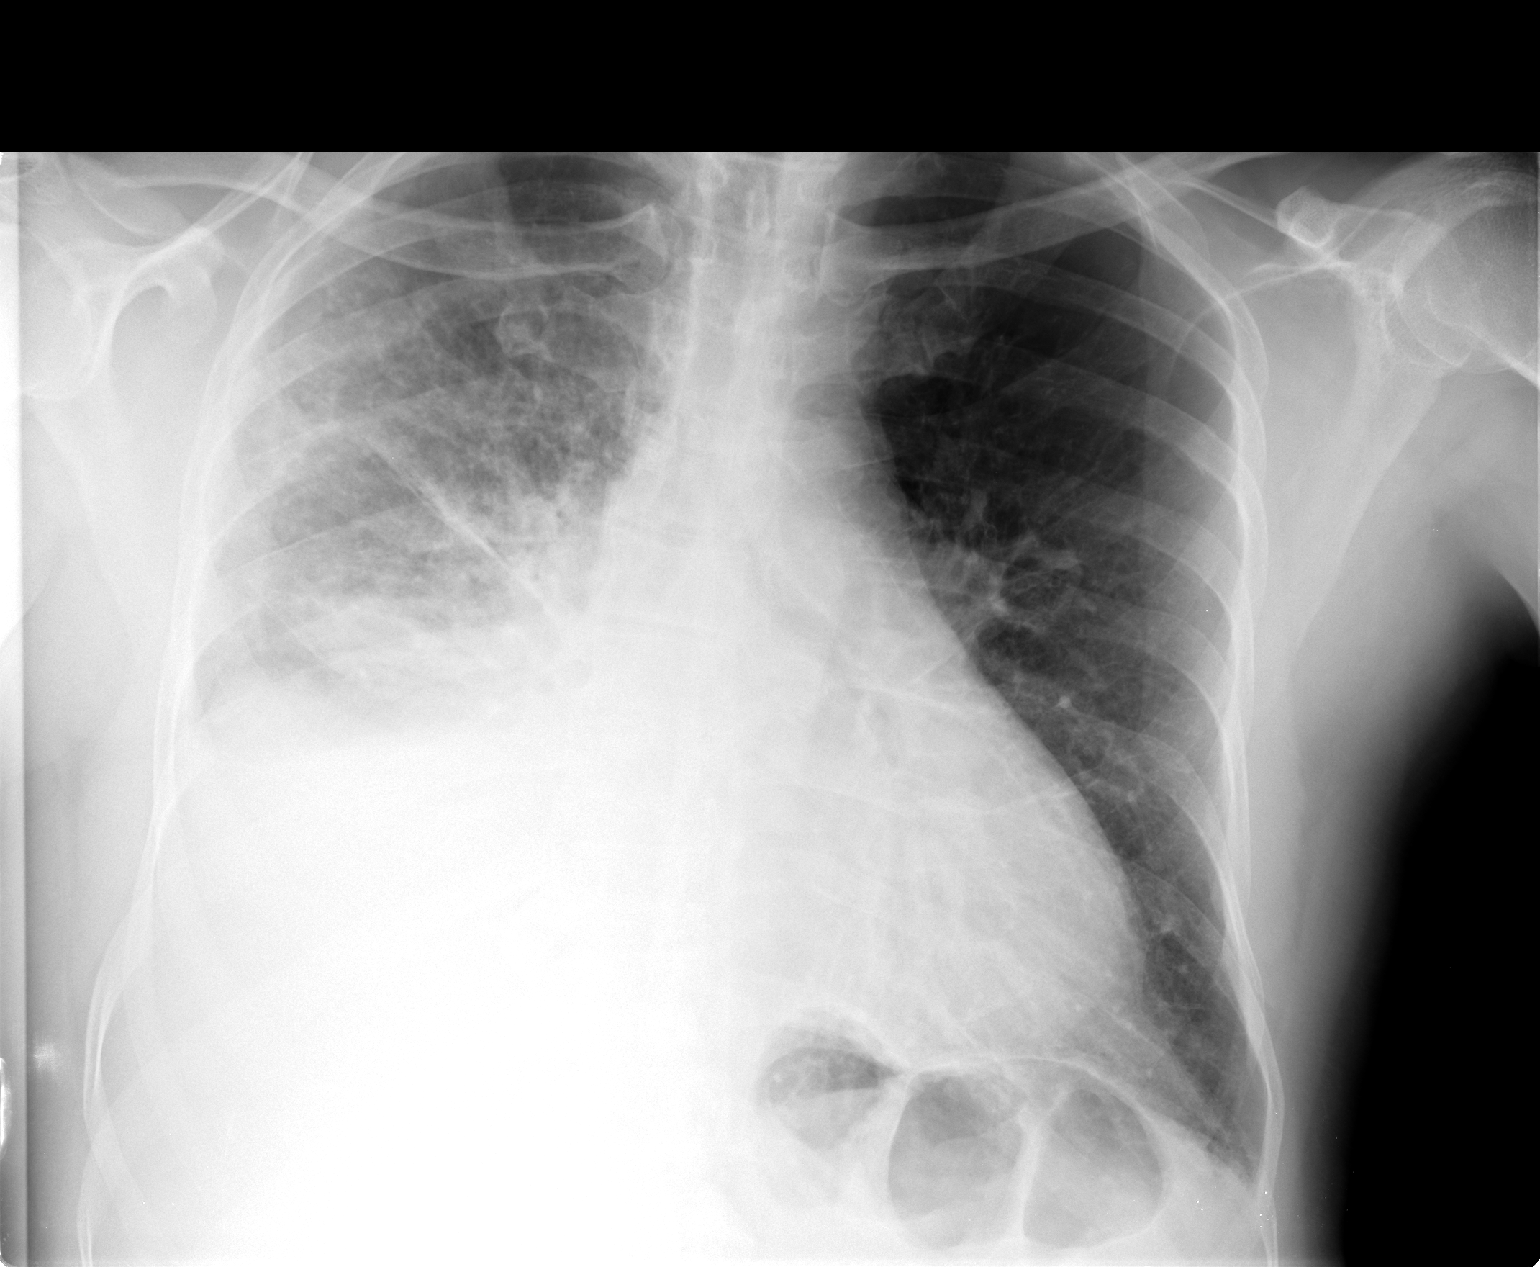

[view not recorded (2 of 2)]
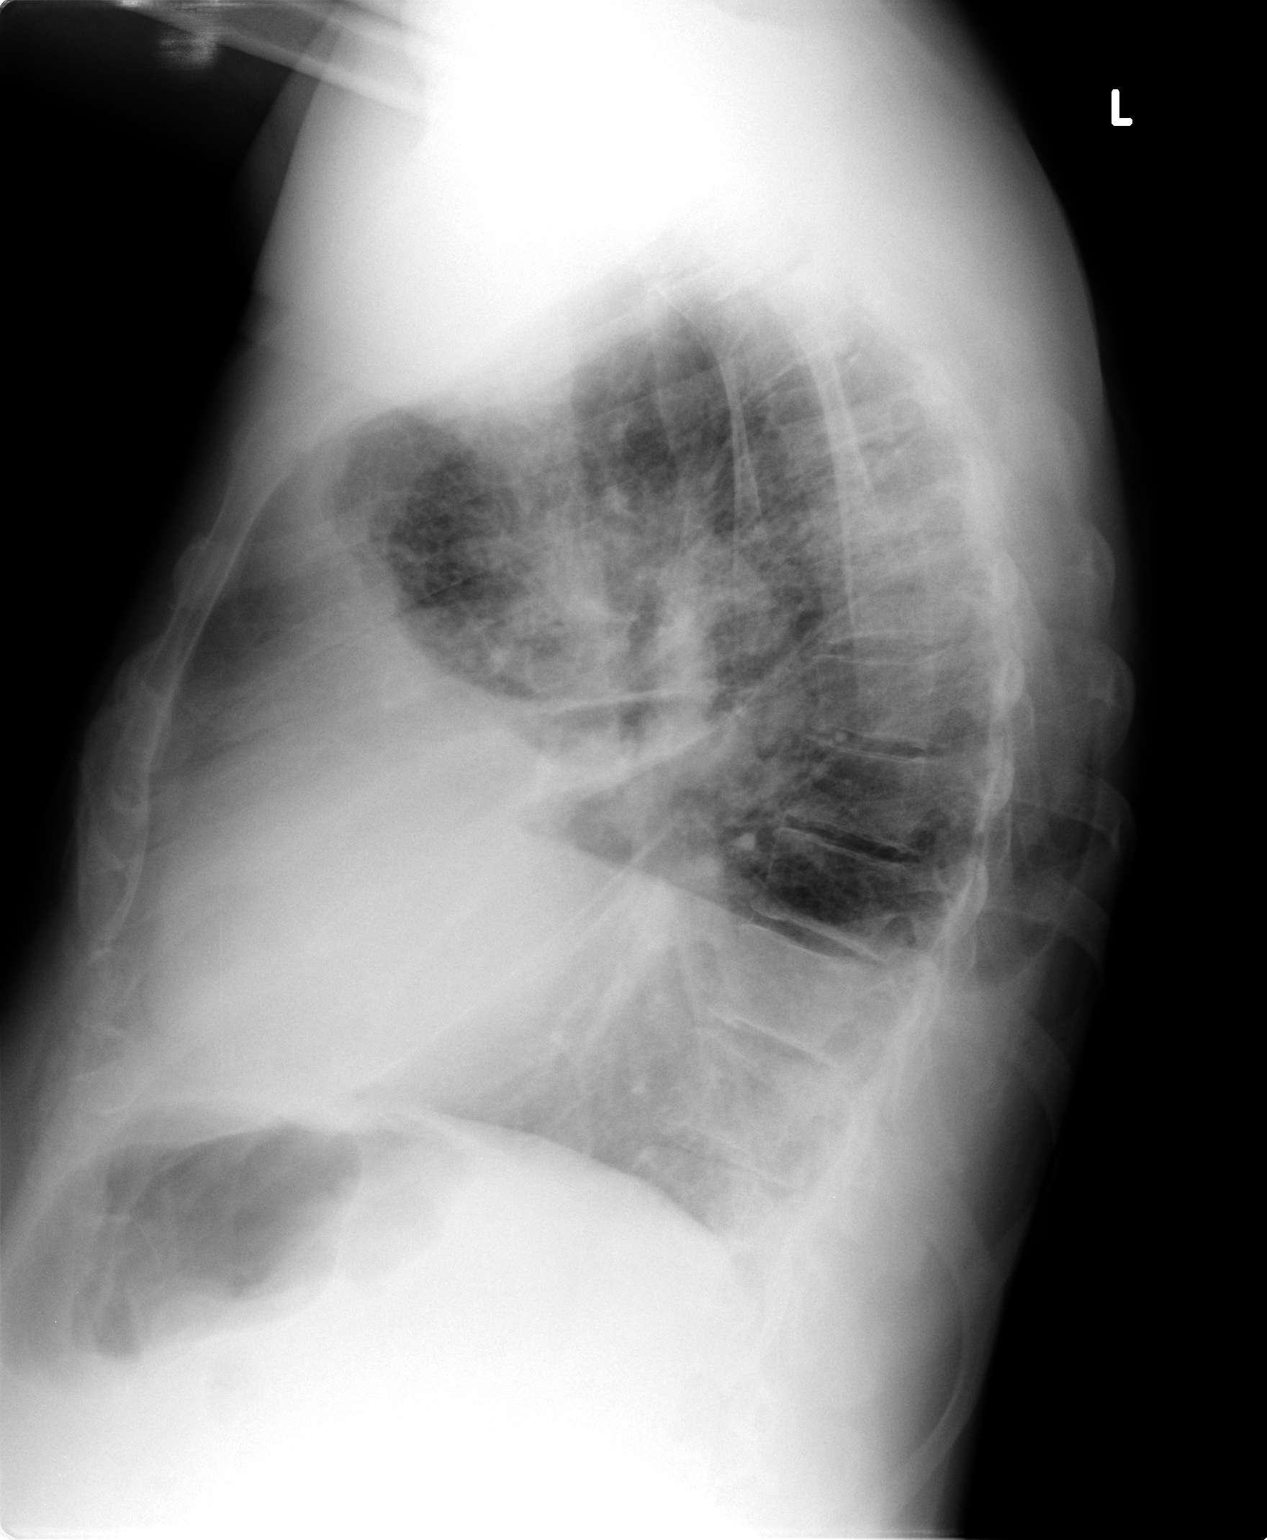

[2 of 2 positions shown; findings below may reference images not displayed]

FINDINGS: Enlarging right pleural effusion.  Continued diffuse
right lung airspace disease.  Left lung remains clear.  Suspect
underlying COPD.  Heart is borderline in size.
IMPRESSION: Enlarging right pleural effusion with continued diffuse right lung
airspace disease.

## 2012-02-29 IMAGING — CR DG CHEST 2V
2 series · 2 of 2 positions shown · non-contrast
Comparison: 11/28/2010

CLINICAL DATA: Pleural effusion.

CHEST - 2 VIEW

[w chest pa]
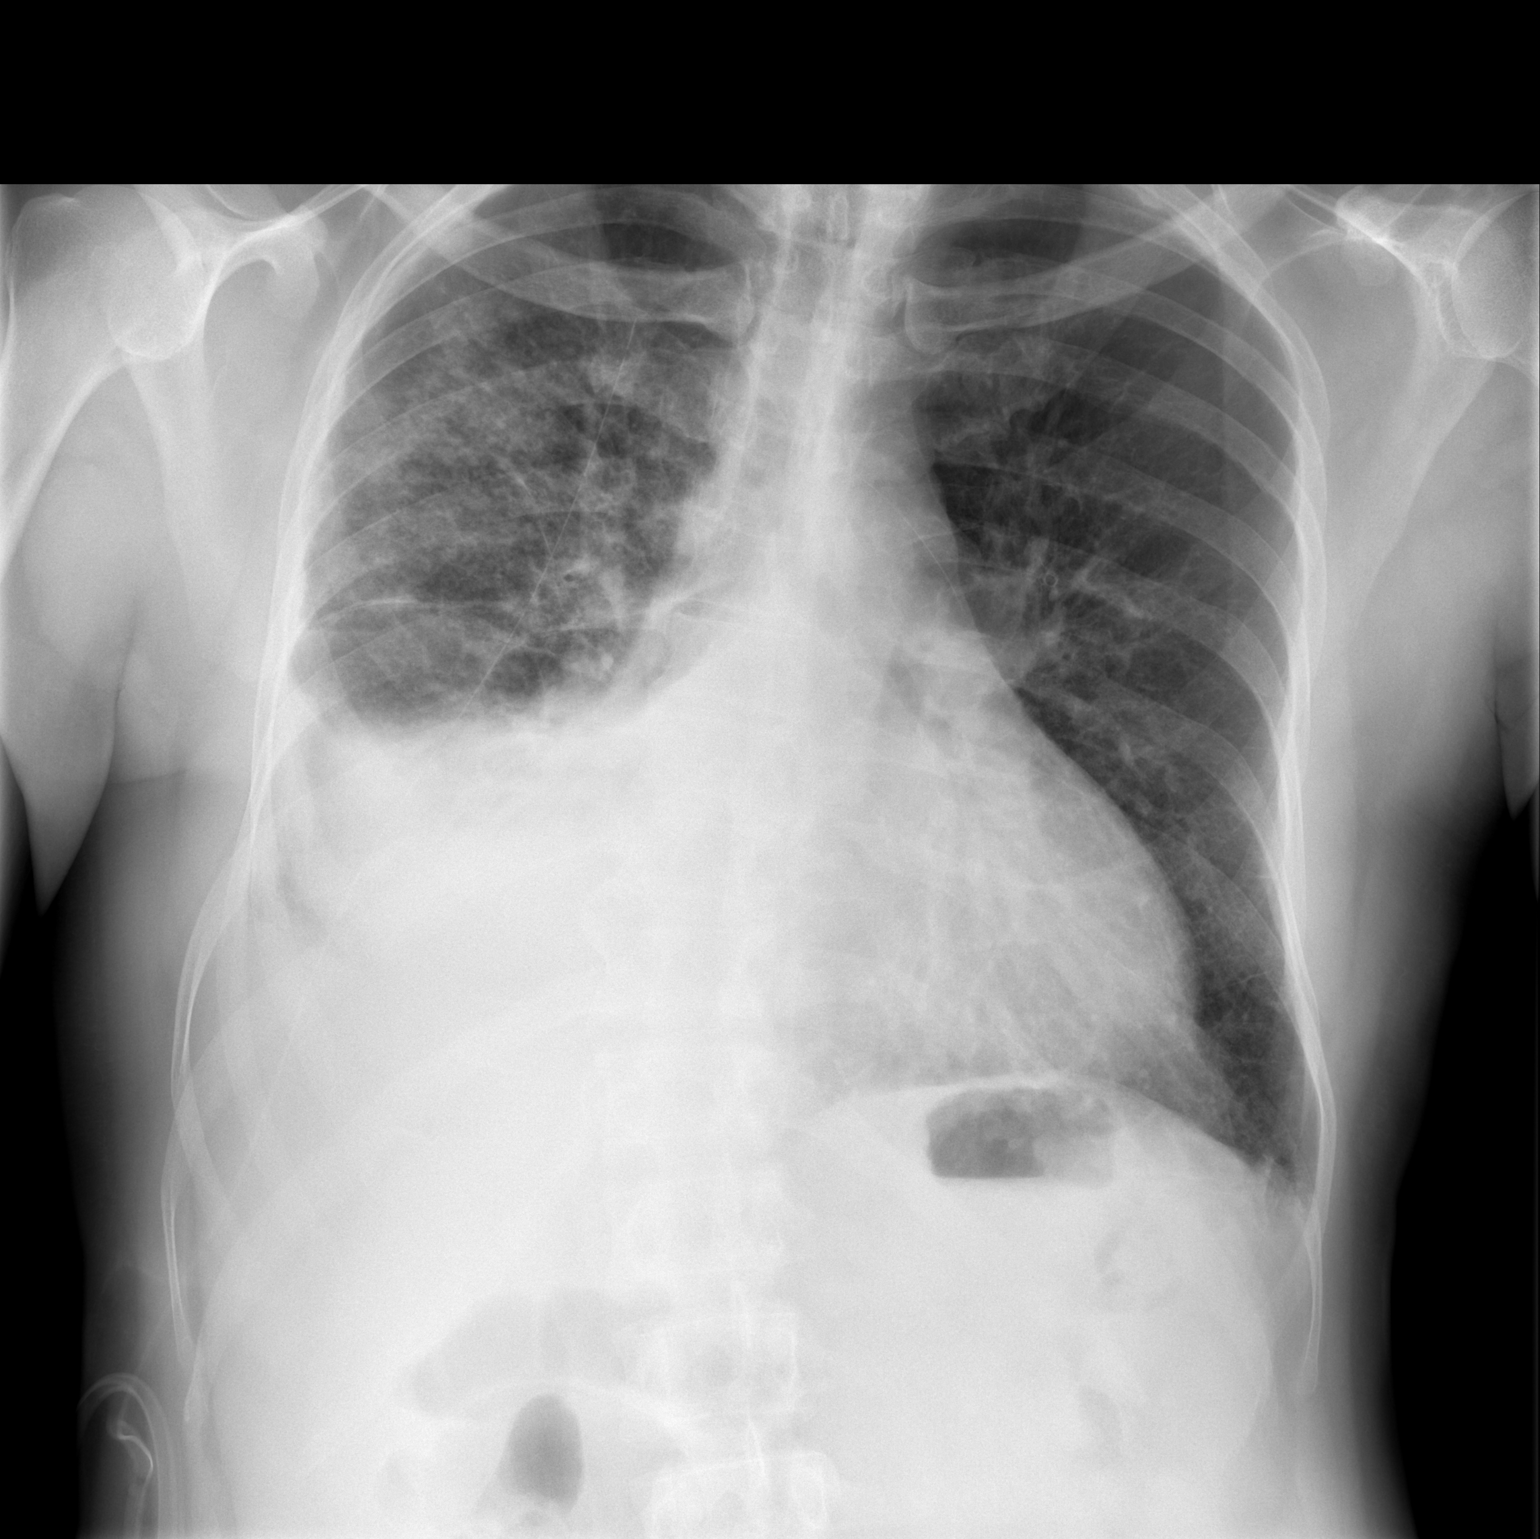

[w chest lat]
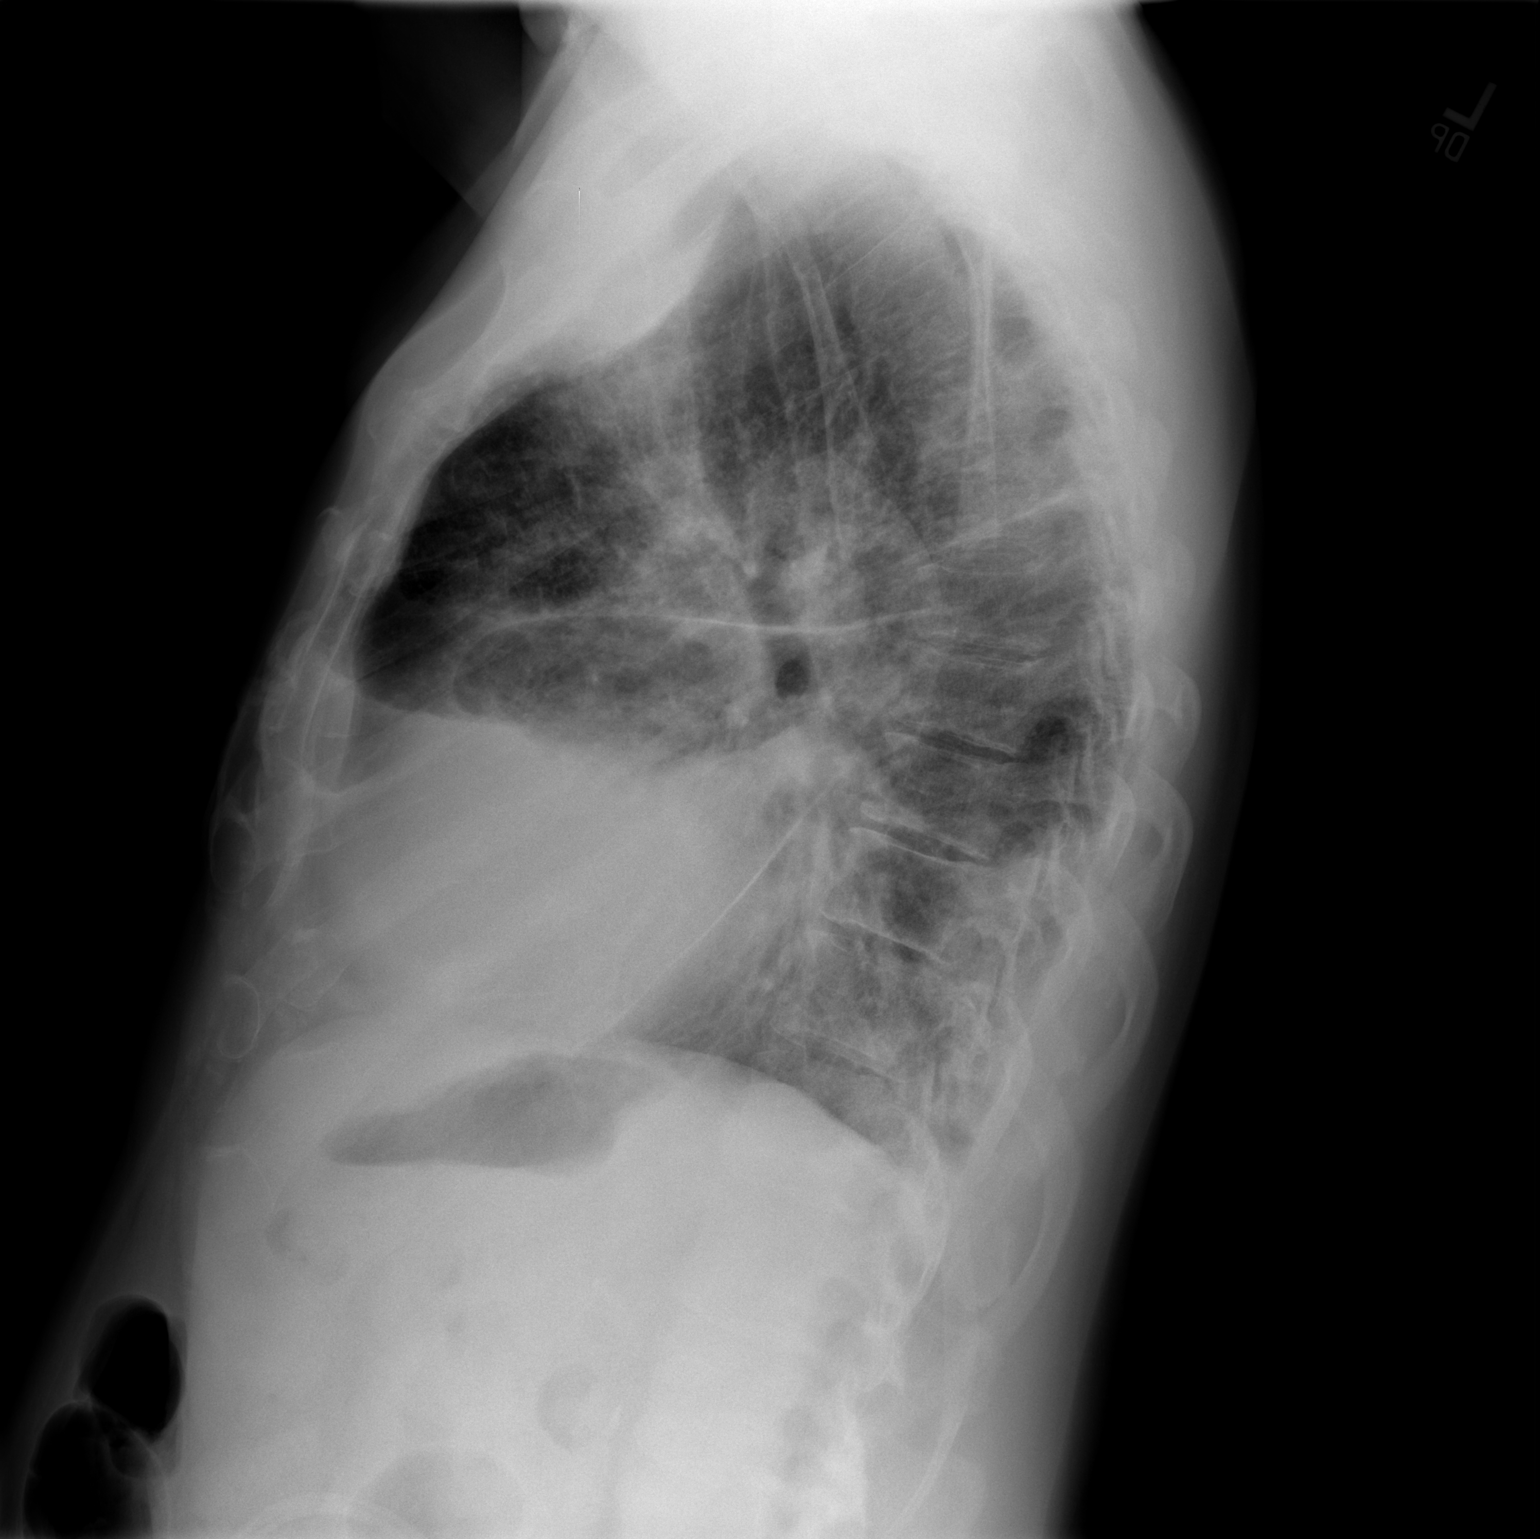

[2 of 2 positions shown; findings below may reference images not displayed]

FINDINGS: Moderately large right pleural effusion is unchanged.
Consolidation in the right lower lobe and right middle lobe is
unchanged.  Right upper lobe patchy airspace disease is unchanged.
Left lung remains clear.  Negative for heart failure.
IMPRESSION: Right pleural effusion and right sided airspace disease is stable.
No new findings.

## 2012-04-16 ENCOUNTER — Ambulatory Visit (INDEPENDENT_AMBULATORY_CARE_PROVIDER_SITE_OTHER): Payer: BC Managed Care – PPO | Admitting: Internal Medicine

## 2012-04-16 ENCOUNTER — Encounter: Payer: Self-pay | Admitting: Internal Medicine

## 2012-04-16 ENCOUNTER — Ambulatory Visit (INDEPENDENT_AMBULATORY_CARE_PROVIDER_SITE_OTHER)
Admission: RE | Admit: 2012-04-16 | Discharge: 2012-04-16 | Disposition: A | Discharge status: Home / Self Care | Payer: BC Managed Care – PPO | Source: Ambulatory Visit | Attending: Internal Medicine | Admitting: Internal Medicine

## 2012-04-16 VITALS — BP 148/78 | HR 78 | Temp 98.0°F | Ht 73.0 in | Wt 207.2 lb

## 2012-04-16 DIAGNOSIS — J90 Pleural effusion, not elsewhere classified: Secondary | ICD-10-CM

## 2012-04-16 DIAGNOSIS — I10 Essential (primary) hypertension: Secondary | ICD-10-CM

## 2012-04-16 DIAGNOSIS — G8912 Acute post-thoracotomy pain: Secondary | ICD-10-CM

## 2012-04-16 NOTE — Patient Instructions (Addendum)
You will likely have permanent scarring in right chest of no clinical significance.   If you are satisfied with your treatment plan let your doctor know and he  can either refill your medications or you can return here when your prescription runs out.     If in any way you are not 100% satisfied,  please tell us.  If 100% better, tell your friends!

## 2012-04-16 NOTE — Progress Notes (Signed)
Subjective:    Patient ID: Larry [name], male    DOB: Oct 07, 1950   MRN: [id number]   Brief patient profile:  61 yobm  Never smoked in country since around 1990 from [place] with very atypical ? Sarcoid/ r effusion 2012  HPI 10/31/2010 Initial pulmonary office eval cc cough x 2 months and sob x sev weeks x fast walk,  Really not very productive. Large r effusion rec dx tap > improved sob for a least a week.  Non dx features of chronic exudate  11/12/2010 ov/Wert cc sob with ex recurrent but not as severe, still dry cough.   rec vats bx  Nov 16, 2010 EBUS and biopsy as well as right VATS and PleuX  placement on  >   cultures all neg, granulmatous inflammation on pleural bx's with necrosis but neg smears/cultures to date with ID f/u planned 6/26  12/13/2010 ov cc worse sob and cough since pleurex removed 6/12, sob mostly with steps/inclines, ok lying flat. Cough is dry and worse supine but breath ok flat. rec Stop atenolol Start bystolic 10 mg one daily - if blood pressure too low (light headed standing) then stop the caduet until next visit As long as coughing you need to take prilosec 20 mg Take 30-60 min before first meal of the day and Pepcid 20 mg at bedtime GERD (REFLUX)   12/18/10 start prednisone 20 mg per day by ID.  01/11/11 ov/Wert cc cough and sob better and able to do more including steps on 20 mg per day but not back to baseline yet rec Reduce prednisone to 20 mg tablet   Take one half daily once you are better to your satisfaction. We always try to reduce the prednisone to the lowest possible dose.   02/22/2011 f/u ov/Wert cc all better, no cough or sob but did not follow instructions to lower prednisone dose and still on 20. rec Try prednisone 10 mg one daily ok to  increase to 20 mg per day if worsen cough or breathing   04/09/2011 f/u ov/Wert  On pred 10 mg per day cc cough gone, no sob. Some sense of nasal obstruction and throat irritation ? Worse when stopped using  gerd rx consistently.  No rash, ocular or articular co's. No sob. rec reduce prednisone to 10 mg qod     05/13/2011 f/u ov/Wert cc no flare of cough or sob with taper pred to 10 mg qod - only resp complaint is nasal obst despite use of nasal steroids x months, no sinus pain or purulent nasal secretions rec Reduce prednisone to 5 mg every other day  Ok to change the nexium to generic protonix (pantoprazole) 40 mg before bfast daily  I emphasized that nasal steroids (flonase)  have no immediate benefit ivn".  Please schedule a follow up office visit in 4 weeks, sooner if needed with cxr on return   06/14/2011 f/u ov/Wert on 5mg  qod  cc increase cough assoc with watery sinus drainage x 2 days - otherwise was doing well on qod dosing, no arthralgias/ocular complaints, rash or sob. rec If cough or breathing worse, ok to increase prednisone to 5 mg two daily until better, the one daily for a week then resume one on even days    07/31/2011 f/u ov/Wert weaned prednisone back to 5 mg every other day s flare of cough, minimal sinus drainage. Sob when bends only. No ocurlar or articular symptoms or rash. rec Try 5 mg prednisone one half on  even days x 6 weeks then return for cxr, office visit sooner if worse pain, breathing or cough  When nose is more congested, try afrin then flonase twice daily x 5 days then stop afrin.  09/18/2011 f/u ov/Wert cc main c/o is nasal congestion much better p afrin vs flonase alone, mostly R side with neg sinus ct noted 03/2011. No sob, cough, no problem with the 2.5 mg qod dosing of pred nor arthralgias/ rash. rec Stop the prednisone completely unless the symptoms you had before come back, then just restart the one half every day.    10/16/2011 f/u ov/Wert cc stopped prednisone one week prior to OV   No cough or sob. No fatigue or nausea. rec No change rx  6/6 -18th 2013 took prednisone for back/L leg pain    01/14/2012 f/u ov/Wert cc overall feel better, occ  cough with min discolored sputum. No sob, articular or ocular complaints or rash- only problem is persistent positional pain along distribution of rib where underwent vats, still using tylenol #3 at hs several times a week.  rec Zostrix cream apply 4 x daily until better, then just at bedtime Only use the Tylenol # 3 if needed for intolerable pain as it does not actually address the cause of the problem  04/16/2012 f/u ov/Wert cc cough resolved, pain better on zostrix > only one Tylenol #3 per week and good activity tol with no doe limiting or significant arthritis.    Sleeping ok without nocturnal  or early am exacerbation  of respiratory  c/o's or need for noct saba. Also denies any obvious fluctuation of symptoms with weather or environmental changes or other aggravating or alleviating factors except as outlined above   ROS  The following are not active complaints unless bolded sore throat, dysphagia, dental problems, itching, sneezing,  nasal congestion or excess/ purulent secretions, ear ache,   fever, chills, sweats, unintended wt loss, pleuritic or exertional cp, hemoptysis,  orthopnea pnd or leg swelling, presyncope, palpitations, heartburn, abdominal pain, anorexia, nausea, vomiting, diarrhea  or change in bowel or urinary habits, change in stools or urine, dysuria,hematuria,  rash, arthralgias, visual complaints, headache, numbness weakness or ataxia or problems with walking or coordination,  change in mood/affect or memory.          PMHx: Hypertension Hyperlipidemia R Pleural effusion     - R tcentesis x 800 cc 10/31/2010  Exudative Lymph> segs, benign inflammatory changes on cyt     - EBUS and biopsy as well as right VATS and PleuX placement on Nov 16, 2010>  NCG with all studies neg o/w       Soc Hx: Never smoked, no heavy industry  Fm Hx  HBP/  Dm mother ? Colon problem in father 5 total siblings all healthy        Objective:   Physical Exam     amb mod   cushingnoid  bm nad   Wt 199 10/31/2010  >221 05/13/11 >   10/16/2011  213 > 01/14/2012  211> 04/16/2012 207  HEENT: nl dentition, turbinates, and orophanx. Nl external ear canals without cough reflex   NECK :  without JVD/Nodes/TM/ nl carotid upstrokes bilaterally   LUNGS: no acc muscle use, decreased bs with min dullness right base only, no cough on inspiration   CV:  RRR  no s3 or murmur or increase in P2, no edema   ABD:  soft and nontender with nl excursion in the supine position. No bruits or  organomegaly, bowel sounds nl  MS:  warm without deformities, calf tenderness, cyanosis or clubbing  SKIN: warm and dry without lesions     CXR  04/16/2012 :  Only small residual effusion, serial comparison: much better aeration R base        Assessment & Plan:

## 2012-04-16 NOTE — Assessment & Plan Note (Signed)
-  Tcentesis  10/31/2010  x 800 cc ice tea >> exudative, Lymphs > segs,  Benign inflammatory changes on cytolgy    - EBUS and biopsy as well as right VATS  Nov 16, 2010 > granulomatous changes only, all cultures neg    - Prednisone started 12/18/10>>> taper to 10 mg per day 02/22/11 > reduce to 10 qod   04/09/2011 > reduce to 5 mg qod 05/13/11 > reduce to 2.5 mg qod 07/31/2011 > d/c 09/18/2011   - Cxr 04/16/2012 > marked serial improvement, no further pulmonary f/u needed  Still unable to give this a specific dx but did appear to be steroid responsive and has not flared off rx x 7 months so not likley to recur though warned may have radiographic abn for the rest of his life in [place] so comparison cxr's will always be needed and preferably should be here or w/in the [facility]/ep[facility] system for apples to apples comparison

## 2012-04-16 NOTE — Assessment & Plan Note (Signed)
-   Trial of zostrix 01/14/2012   Improved 04/16/2012 with min need for tylenol #3 > f/u prn

## 2012-04-16 NOTE — Assessment & Plan Note (Signed)
Adequate control on present rx, reviewed  >> Defer choice of chronic rx to Dr Tenny Craw but given tendency to cough would avoid ACEI

## 2016-05-24 MED FILL — AZITHROMYCIN 500 MG TABLET: 500 | 3 days supply | Qty: 3 | Fill #0

## 2016-05-24 MED FILL — ATOVAQUONE-PROGUANIL 250-10: 250-100 | 27 days supply | Qty: 27 | Fill #0

## 2017-04-28 DIAGNOSIS — R3911 Hesitancy of micturition: Secondary | ICD-10-CM | POA: Diagnosis not present

## 2017-04-28 DIAGNOSIS — Z0000 Encounter for general adult medical examination without abnormal findings: Secondary | ICD-10-CM | POA: Diagnosis not present

## 2017-04-28 DIAGNOSIS — L309 Dermatitis, unspecified: Secondary | ICD-10-CM | POA: Diagnosis not present

## 2017-04-28 DIAGNOSIS — M545 Low back pain: Secondary | ICD-10-CM | POA: Diagnosis not present

## 2017-04-28 DIAGNOSIS — I10 Essential (primary) hypertension: Secondary | ICD-10-CM | POA: Diagnosis not present

## 2017-04-28 DIAGNOSIS — E7800 Pure hypercholesterolemia, unspecified: Secondary | ICD-10-CM | POA: Diagnosis not present

## 2017-04-28 DIAGNOSIS — Z23 Encounter for immunization: Secondary | ICD-10-CM | POA: Diagnosis not present

## 2017-10-14 DIAGNOSIS — M79606 Pain in leg, unspecified: Secondary | ICD-10-CM | POA: Diagnosis not present

## 2018-05-26 DIAGNOSIS — Z125 Encounter for screening for malignant neoplasm of prostate: Secondary | ICD-10-CM | POA: Diagnosis not present

## 2018-05-26 DIAGNOSIS — E7800 Pure hypercholesterolemia, unspecified: Secondary | ICD-10-CM | POA: Diagnosis not present

## 2018-05-26 DIAGNOSIS — I10 Essential (primary) hypertension: Secondary | ICD-10-CM | POA: Diagnosis not present

## 2018-05-29 DIAGNOSIS — Z0000 Encounter for general adult medical examination without abnormal findings: Secondary | ICD-10-CM | POA: Diagnosis not present

## 2018-05-29 DIAGNOSIS — J309 Allergic rhinitis, unspecified: Secondary | ICD-10-CM | POA: Diagnosis not present

## 2018-05-29 DIAGNOSIS — R21 Rash and other nonspecific skin eruption: Secondary | ICD-10-CM | POA: Diagnosis not present

## 2018-05-29 DIAGNOSIS — I10 Essential (primary) hypertension: Secondary | ICD-10-CM | POA: Diagnosis not present

## 2018-05-29 DIAGNOSIS — Z1159 Encounter for screening for other viral diseases: Secondary | ICD-10-CM | POA: Diagnosis not present

## 2018-05-29 DIAGNOSIS — Z23 Encounter for immunization: Secondary | ICD-10-CM | POA: Diagnosis not present

## 2018-05-29 DIAGNOSIS — E7800 Pure hypercholesterolemia, unspecified: Secondary | ICD-10-CM | POA: Diagnosis not present

## 2018-05-29 DIAGNOSIS — C61 Malignant neoplasm of prostate: Secondary | ICD-10-CM | POA: Diagnosis not present

## 2018-06-09 DIAGNOSIS — Z1211 Encounter for screening for malignant neoplasm of colon: Secondary | ICD-10-CM | POA: Diagnosis not present

## 2018-06-10 DIAGNOSIS — R3915 Urgency of urination: Secondary | ICD-10-CM | POA: Diagnosis not present

## 2018-06-10 DIAGNOSIS — R972 Elevated prostate specific antigen [PSA]: Secondary | ICD-10-CM | POA: Diagnosis not present

## 2018-06-10 DIAGNOSIS — C61 Malignant neoplasm of prostate: Secondary | ICD-10-CM | POA: Diagnosis not present

## 2018-06-10 DIAGNOSIS — N401 Enlarged prostate with lower urinary tract symptoms: Secondary | ICD-10-CM | POA: Diagnosis not present

## 2018-06-23 ENCOUNTER — Other Ambulatory Visit: Payer: Self-pay | Admitting: Urology

## 2018-06-23 DIAGNOSIS — C61 Malignant neoplasm of prostate: Secondary | ICD-10-CM

## 2018-07-07 ENCOUNTER — Ambulatory Visit
Admission: RE | Admit: 2018-07-07 | Discharge: 2018-07-07 | Disposition: A | Discharge status: Home / Self Care | Payer: Medicare Other | Source: Ambulatory Visit | Attending: Urology | Admitting: Urology

## 2018-07-07 DIAGNOSIS — C61 Malignant neoplasm of prostate: Secondary | ICD-10-CM

## 2018-07-07 MED ORDER — GADOBENATE DIMEGLUMINE 529 MG/ML IV SOLN
18.0000 mL | Freq: Once | INTRAVENOUS | Status: AC | PRN
  Administered 2018-07-07: 18 mL via INTRAVENOUS

## 2018-07-22 DIAGNOSIS — Z1211 Encounter for screening for malignant neoplasm of colon: Secondary | ICD-10-CM | POA: Diagnosis not present

## 2018-07-22 DIAGNOSIS — Z01818 Encounter for other preprocedural examination: Secondary | ICD-10-CM | POA: Diagnosis not present

## 2019-01-27 DIAGNOSIS — Z1159 Encounter for screening for other viral diseases: Secondary | ICD-10-CM | POA: Diagnosis not present

## 2019-02-02 ENCOUNTER — Encounter: Payer: Self-pay | Admitting: Critical Care Medicine

## 2019-02-02 ENCOUNTER — Ambulatory Visit (INDEPENDENT_AMBULATORY_CARE_PROVIDER_SITE_OTHER): Payer: Medicare Other

## 2019-02-02 ENCOUNTER — Ambulatory Visit (INDEPENDENT_AMBULATORY_CARE_PROVIDER_SITE_OTHER): Payer: Medicare Other | Admitting: Critical Care Medicine

## 2019-02-02 ENCOUNTER — Other Ambulatory Visit (INDEPENDENT_AMBULATORY_CARE_PROVIDER_SITE_OTHER): Payer: Medicare Other

## 2019-02-02 ENCOUNTER — Other Ambulatory Visit: Payer: Self-pay

## 2019-02-02 VITALS — BP 172/88 | HR 81 | Temp 98.2°F | Ht 72.0 in | Wt 197.8 lb

## 2019-02-02 DIAGNOSIS — J849 Interstitial pulmonary disease, unspecified: Secondary | ICD-10-CM | POA: Diagnosis not present

## 2019-02-02 DIAGNOSIS — R0602 Shortness of breath: Secondary | ICD-10-CM

## 2019-02-02 LAB — SEDIMENTATION RATE: Sed Rate: 91 mm/hr — ABNORMAL HIGH (ref 0–20)

## 2019-02-02 LAB — C-REACTIVE PROTEIN: CRP: 9.9 mg/dL (ref 0.5–20.0)

## 2019-02-02 NOTE — Patient Instructions (Addendum)
Thank you for visiting Dr. Carlis Abbott at [facility]. We recommend the following: Orders Placed This Encounter  Procedures  . DG Chest 2 View  . CT Chest High Resolution  . 6 minute walk  . Pulmonary function test   Blood tests today 6 minute walk test and pulmonary function tests CT scan  Return in about 4 weeks (around 03/02/2019).    Please do your part to reduce the spread of COVID-19.

## 2019-02-02 NOTE — Progress Notes (Signed)
Synopsis: Referred in 2012 for evaluation of a pleural effusion by Lawerance Cruel, MD . Larry [name] was previously cared for by Dr. Melvyn Novas and was last seen in 2013.  Subjective:   PATIENT ID: Larry [name] GENDER: male DOB: March 15, 1951, MRN: [id number]  Chief Complaint  Patient presents with  . Consult    pt can tell Larry [name] has fluid from the way Larry [name] felt the first time Larry [name] had fluid back in 2013, when move around increased Shortness of breath.    Larry [name] is a 68 year old gentleman who returns to [facility] for evaluation of recurrent shortness of breath.  Larry [name] was previously treated for a sarcoid-like illness causing a pleural effusion with a prolonged course of prednisone about 8 years ago.  At that time Larry [name] had a lymphocyte- predominant pleural effusion. Upon VATS biopsy Larry [name] had granulomatous inflammation of the pleura and mucosa.  Surgical cultures were negative, and pathology did not demonstrate organisms.  Larry [name] was released in 2012 with significant improvement.   At this time Larry [name] presents with symptoms similar to what Larry [name] had in 2012- unintentional weight loss, fatigue, shortness of breath.  Larry [name] reports having oxygen saturations around 90% at home.  Larry [name] is worried that Larry [name] has reaccumulated fluid in his lung.  His dyspnea on exertion limits his activity.  Larry [name] can only walk up 1 flight of stairs before having to stop to rest.  This began in January 2020, and has progressively worsened since then.  Larry [name] has very minimal coughing and no sputum production.  Larry [name] wheezes occasionally, when lying on his left side.  Larry [name] has dyspnea when laying on his left side, which resolves with turning to his right side.  Larry [name] has no history of malignancy, therapy, radiation, smoking or vaping, exposure to birds, and Larry [name] does not have other pets.  Larry [name] has not been exposed to amiodarone, Macrobid, or methotrexate.  Larry [name] was born in [place], and moved to [place] in 1998.  Larry [name] lived in [place] for about 1 month prior to moving  to [place], and has been here since then.  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  Larry [name] does not wear a respirator at work.  Larry [name] does not note any recent changes at work that Larry [name] thinks precipitated his symptoms.  Larry [name] does not smoke or vape.  No illicit drug use. There is no family history of lung disease.  Larry [name] has been previously screened for TB, and had a negative tuberculin skin test by his report.  Larry [name] denies rashes, Raynaud's, joint swelling, pain, or stiffness, rashes, dry mouth, eye irritation or history of abnormal eye exams by his ophthalmologist, muscle weakness, myalgias, or adenopathy.   Past Medical History:  Diagnosis Date  . Abdominal pain   . Chest pain   . Fever   . Generalized headaches   . GERD (gastroesophageal reflux disease)   . Hyperlipidemia   . Hypertension   . Multiple allergies   . Nasal congestion      Family History  Problem Relation Age of Onset  . Diabetes Mother   . Deep vein thrombosis Mother   . Allergies Son      Past Surgical History:  Procedure Laterality Date  . Drainage of pleural effusion.  11/16/2010  . ENDOBRONCHIAL ULTRASOUND  11/16/2010  . FIBEROPTIC BRONCHOSCOPY  11/16/2010   Dr Arlyce Dice, ytology from EBUS on Nov 16, 2010, right lung   . Insertion of PleurX catheter.  11/16/2010  . Pleural biopsies.  11/16/2010  . Right VATS.  11/16/2010    Social History   Socioeconomic History  . Marital status: Married    Spouse name: Not on file  . Number of children: 3  . Years of education: Not on file  . Highest education level: Not on file  Occupational History  . Occupation: Civil engineer, contracting: [employer]  . Financial resource strain: Not on file  . Food insecurity    Worry: Not on file    Inability: Not on file  . Transportation needs    Medical: Not on file    Non-medical: Not on file  Tobacco Use  . Smoking status: Never Smoker  . Smokeless tobacco: Never Used  Substance and  Sexual Activity  . Alcohol use: Yes    Comment: occasional  . Drug use: No  . Sexual activity: Not on file  Lifestyle  . Physical activity    Days per week: Not on file    Minutes per session: Not on file  . Stress: Not on file  Relationships  . Social Herbalist on phone: Not on file    Gets together: Not on file    Attends religious service: Not on file    Active member of club or organization: Not on file    Attends meetings of clubs or organizations: Not on file    Relationship status: Not on file  . Intimate partner violence    Fear of current or ex partner: Not on file    Emotionally abused: Not on file    Physically abused: Not on file    Forced sexual activity: Not on file  Other Topics Concern  . Not on file  Social History Narrative  . Not on file     No Known Allergies   Immunization History  Administered Date(s) Administered  . Influenza Whole 03/29/2011  . Pneumococcal Polysaccharide-23 04/09/2011    Outpatient Medications Prior to Visit  Medication Sig Dispense Refill  . atorvastatin (LIPITOR) 40 MG tablet Take 1 tablet by mouth daily.    . carvedilol (COREG) 25 MG tablet     . fluticasone (FLONASE) 50 MCG/ACT nasal spray Place 2 sprays into the nose daily as needed.     [name] Kitchen ibuprofen (ADVIL,MOTRIN) 200 MG tablet Take 200 mg by mouth every 6 (six) hours as needed.      . mometasone (ELOCON) 0.1 % cream Apply 1 application topically daily.    [name] Kitchen Phenylephrine-Ibuprofen (ADVIL CONGESTION RELIEF PO) Per bottle directions as needed     . acetaminophen-codeine (TYLENOL #3) 300-30 MG per tablet Take 1 tablet by mouth every 4 (four) hours as needed. (Patient not taking: Reported on 02/02/2019) 40 tablet 0  . hydrochlorothiazide (HYDRODIURIL) 25 MG tablet Take 12.5 mg by mouth daily.    . hydrocortisone 2.5 % cream Apply 1 application topically 2 (two) times daily.      . nebivolol (BYSTOLIC) 10 MG tablet One daily (Patient not taking: Reported on  02/02/2019) 40 tablet 0  . pantoprazole (PROTONIX) 40 MG tablet Take 40 mg by mouth daily as needed.     No facility-administered medications prior to visit.     Review of Systems  Constitutional: Positive for malaise/fatigue and weight loss. Negative for chills, diaphoresis and fever.  HENT: Negative for congestion, ear pain and sore throat.   Respiratory: Positive for cough, sputum production, shortness of breath and wheezing. Negative for hemoptysis.   Cardiovascular:  Negative for chest pain, palpitations and leg swelling.  Gastrointestinal: Negative for abdominal pain, heartburn and nausea.  Genitourinary: Positive for frequency.  Musculoskeletal: Negative for joint pain and myalgias.  Skin: Negative for itching and rash.  Neurological: Positive for weakness. Negative for dizziness and headaches.  Endo/Heme/Allergies: Does not bruise/bleed easily.  Psychiatric/Behavioral: Negative for depression. The patient is nervous/anxious.      Objective:   Vitals:   02/02/19 0912  BP: (!) 172/88  Pulse: 81  Temp: 98.2 F (36.8 C)  TempSrc: Oral  SpO2: 90%  Weight: 197 lb 12.8 oz (89.7 kg)  Height: 6' (1.829 m)   90% on  RA BMI Readings from Last 3 Encounters:  02/02/19 26.83 kg/m  04/16/12 27.34 kg/m  01/14/12 27.84 kg/m   Wt Readings from Last 3 Encounters:  02/02/19 197 lb 12.8 oz (89.7 kg)  04/16/12 207 lb 3.2 oz (94 kg)  01/14/12 211 lb (95.7 kg)    Physical Exam Constitutional:      General: Larry [name] is not in acute distress.    Appearance: Normal appearance. Larry [name] is not ill-appearing.  HENT:     Head: Normocephalic and atraumatic.     Nose: Nose normal. No rhinorrhea.     Mouth/Throat:     Mouth: Mucous membranes are moist.  Eyes:     Conjunctiva/sclera: Conjunctivae normal.  Neck:     Musculoskeletal: Neck supple. No neck rigidity.  Cardiovascular:     Rate and Rhythm: Normal rate and regular rhythm.     Heart sounds: No murmur.  Pulmonary:     Comments:  Resting tachypnea.  Occasionally truncated sentences.  Clear to auscultation bilaterally except for reduced breath sounds in right base.  Dropped saturations to 87% when walking in the office on room air, which improved on 2 L of supplemental oxygen. Abdominal:     General: Abdomen is flat. There is no distension.     Palpations: Abdomen is soft.     Tenderness: There is no abdominal tenderness.  Musculoskeletal:        General: No swelling or deformity.  Lymphadenopathy:     Cervical: No cervical adenopathy.  Skin:    General: Skin is warm and dry.     Findings: No rash.  Neurological:     General: No focal deficit present.     Mental Status: Larry [name] is alert.     Motor: No weakness.     CBC    Component Value Date/Time   WBC 7.0 11/19/2010 0400   RBC 4.41 11/19/2010 0400   HGB 12.7 (L) 11/19/2010 0400   HCT 36.2 (L) 11/19/2010 0400   PLT 218 11/19/2010 0400   MCV 82.1 11/19/2010 0400   MCH 28.8 11/19/2010 0400   MCHC 35.1 11/19/2010 0400   RDW 13.5 11/19/2010 0400   LYMPHSABS 1.0 11/14/2010 1238   MONOABS 0.7 11/14/2010 1238   EOSABS 0.5 11/14/2010 1238   BASOSABS 0.0 11/14/2010 1238   No ANCA on file ACE 33 (11/18/2010) No rheumatologic studies in the past  Chest Imaging- films reviewed: Chest x-ray 04/16/2012 reviewed.  Demonstrates right-sided volume loss and chronic changes in the base.  Increased opacification relative to the left.   CT scan 02/20/2011 reviewed.  Volume loss on the right hemithorax with pleural thickening throughout most notable in the base posteriorly.  Nonspecific increased interstitial markings throughout.  Mild airway thickening bilaterally.  No cavities.  Hest x-ray 02/02/2019 demonstrates no left-sided pleural effusion.  Chronic right basilar changes.  Peripherally predominant  interstitial opacities bilaterally.  Pulmonary Functions Testing Results: No flowsheet data found.  Pathology: Previous pathology reports reviewed from 2012 VATS     Assessment & Plan:     ICD-10-CM   1. Interstitial pulmonary disease (HCC)  J84.9 Pulmonary function test    CT Chest High Resolution    6 minute walk    Aldolase    Ambulatory Referral for DME    ANA w/Reflex    ANCA screen with reflex titer    Anti-DNA antibody, double-stranded    Anti-Jo 1 antibody, IgG    Anti-scleroderma antibody    Anti-Smith antibody    Centromere Antibodies    CK total and CKMB (cardiac)not at [facility]    C-reactive protein    Cyclic citrul peptide antibody, IgG    Hypersensitivity Pneumonitis    Quantiferon tb gold assay    Rheumatoid factor    RNP Antibodies    Sedimentation rate    Sjogren's syndrome antibods(ssa + ssb)    Myositis Panel III    CANCELED: 6 minute walk    CANCELED: Quantiferon tb gold assay    CANCELED: Ambulatory Referral for DME    CANCELED: Hypersensitivity Pneumonitis    CANCELED: ANA w/Reflex    CANCELED: Anti-DNA antibody, double-stranded    CANCELED: Anti-scleroderma antibody    CANCELED: Anti-Smith antibody    CANCELED: C-reactive protein    CANCELED: Cyclic citrul peptide antibody, IgG    CANCELED: Rheumatoid factor    CANCELED: Sedimentation rate    CANCELED: Sjogren's syndrome antibods(ssa + ssb)    CANCELED: ANCA screen with reflex titer    CANCELED: RNP Antibodies    CANCELED: Aldolase    CANCELED: Centromere Antibodies    CANCELED: CK total and CKMB (cardiac)not at [facility]    CANCELED: Anti-Jo 1 antibody, IgG  2. SOB (shortness of breath)  R06.02 DG Chest 2 View    Quantiferon tb gold assay    CANCELED: Quantiferon tb gold assay   Interstitial pulmonary disease with exertional hypoxia.  This degree of lung disease certainly explains the shortness of breath that Larry [name] is describing. His history of unspecified granulomatous disease is interesting. Sarcoidosis seldom causes pleural effusions, and there is no description of significant adenopathy in the past.  His occupational history of exposure to microchips raises concern  for possible beryllium-related lung disease.  Adenopathy would be affected with this as well.  His history of improvement on prednisone and lack of significant parenchymal lung disease suggests against an infectious cause. - Walked in clinic today to evaluate for hypoxia with exertion.  Formal 6-minute walk test ordered. -PFTs -CT chest ILD protocol -ILD questionnaire and labs -Although unlikely, we will check QuantiFERON given his history of living in [place] in the remote past. -Return to clinic in 1 month. -Although Larry [name] is curious about starting prednisone immediately, we discussed that this can be helpful, but can be harmful in some diagnoses, specifically chronic infections.  It would be helpful to get additional information to help make a diagnosis to determine if prednisone is appropriate and the expected duration.  Acute hypoxic respiratory failure due to interstitial lung disease -Supplemental oxygen prescribed @2L  -6-minute walk test ordered     Current Outpatient Medications:  .  atorvastatin (LIPITOR) 40 MG tablet, Take 1 tablet by mouth daily., Disp: , Rfl:  .  carvedilol (COREG) 25 MG tablet, , Disp: , Rfl:  .  fluticasone (FLONASE) 50 MCG/ACT nasal spray, Place 2 sprays into the nose daily as needed. ,  Disp: , Rfl:  .  ibuprofen (ADVIL,MOTRIN) 200 MG tablet, Take 200 mg by mouth every 6 (six) hours as needed.  , Disp: , Rfl:  .  mometasone (ELOCON) 0.1 % cream, Apply 1 application topically daily., Disp: , Rfl:  .  Phenylephrine-Ibuprofen (ADVIL CONGESTION RELIEF PO), Per bottle directions as needed , Disp: , Rfl:  .  acetaminophen-codeine (TYLENOL #3) 300-30 MG per tablet, Take 1 tablet by mouth every 4 (four) hours as needed. (Patient not taking: Reported on 02/02/2019), Disp: 40 tablet, Rfl: 0 .  hydrochlorothiazide (HYDRODIURIL) 25 MG tablet, Take 12.5 mg by mouth daily., Disp: , Rfl:  .  hydrocortisone 2.5 % cream, Apply 1 application topically 2 (two) times daily.  ,  Disp: , Rfl:  .  nebivolol (BYSTOLIC) 10 MG tablet, One daily (Patient not taking: Reported on 02/02/2019), Disp: 40 tablet, Rfl: 0 .  pantoprazole (PROTONIX) 40 MG tablet, Take 40 mg by mouth daily as needed., Disp: , Rfl:   Greater than 60 minutes was spent on this patient encounter, with more than 50% spent in face-to-face contact with patient.  Julian Hy, DO [facility] 02/02/2019 12:52 PM

## 2019-02-03 ENCOUNTER — Telehealth: Payer: Self-pay | Admitting: Critical Care Medicine

## 2019-02-03 LAB — CENTROMERE ANTIBODIES: Centromere Ab Screen: <1 AI

## 2019-02-03 NOTE — Telephone Encounter (Signed)
Returned call to [place] at [employer] but he was not in the office.  Left message to call us.

## 2019-02-05 ENCOUNTER — Institutional Professional Consult (permissible substitution): Payer: Medicare Other | Admitting: Internal Medicine

## 2019-02-05 LAB — HYPERSENSITIVITY PNEUMONITIS
A. Pullulans Abs: NEGATIVE
A.Fumigatus #1 Abs: NEGATIVE
Micropolyspora faeni, IgG: NEGATIVE
Pigeon Serum Abs: NEGATIVE
Thermoact. Saccharii: NEGATIVE
Thermoactinomyces vulgaris, IgG: NEGATIVE

## 2019-02-05 LAB — ANTI-JO 1 ANTIBODY, IGG: Anti JO-1: <0.2 AI (ref 0.0–0.9)

## 2019-02-05 LAB — RNP ANTIBODIES: ENA RNP Ab: <0.2 AI (ref 0.0–0.9)

## 2019-02-05 LAB — ANA W/REFLEX: Anti Nuclear Antibody (ANA): NEGATIVE

## 2019-02-05 NOTE — Telephone Encounter (Signed)
Spoke with Jeneen Rinks, there must be something in the notes stating that the patient has used medications in the past to help with her breathing prior to needing oxygen.  in the past as a medication for the purpose of needing oxygen. We need to specify all the ATMS regarding this pt. Jeneen Rinks stated we could even write a letter to [employer]. I don't see that the patients has used albuterol or any medications used for her respiratory condition. Dr. Carlis Abbott please advise. Rodena Piety are you working on this as well?

## 2019-02-08 NOTE — Telephone Encounter (Signed)
Received another response from Jeneen Rinks with [employer] which I have posted below:  I just got finished fighting this with Billing and they Have Agreed that ILD can stand alone without ATMs. All is good please let Dr. Carlis Abbott know. Thank you  Routing to Dr. Carlis Abbott as an FYI that all has been able to be taken care of with pt's insurance. Nothing further needed.

## 2019-02-08 NOTE — Telephone Encounter (Signed)
Dr. Carlis Abbott I had to ask as well since this isn't a usual medical abbreviation. A.T.M. = Alternative Treatment Measures  Thank you!

## 2019-02-08 NOTE — Telephone Encounter (Signed)
Called and spoke with Jeneen Rinks from [employer]. I stated to Jeneen Rinks the info from Dr. Carlis Abbott. After stating that info to Jeneen Rinks, he said that he was going to email me a list of different ATMs that we can be able to see if any would work as this is needed for [employer].  As soon as the email has been received, I will print the list out so we can then give to Dr. Carlis Abbott for her to be able to review.  Jeneen Rinks stated that they did provide pt with the O2 but they need to have this for insurance purposes.

## 2019-02-08 NOTE — Telephone Encounter (Signed)
Also routing this to [place] as she will be working with Dr. Carlis Abbott 8/20 and I have given the list to her so she can give it to Dr. Carlis Abbott.

## 2019-02-08 NOTE — Telephone Encounter (Signed)
List of ATMs was received from [place]. Will give to Dr. Carlis Abbott for her to review.  Dr. Carlis Abbott will be back in office 02/11/2019.  Routing to Dr. Carlis Abbott as an Juluis Rainier. Also making notation that this list needs to be reviewed as this is insurance that is requesting and requiring this.

## 2019-02-09 ENCOUNTER — Encounter (HOSPITAL_COMMUNITY): Payer: Self-pay | Admitting: Emergency Medicine

## 2019-02-09 ENCOUNTER — Emergency Department (HOSPITAL_COMMUNITY): Payer: Medicare Other

## 2019-02-09 ENCOUNTER — Other Ambulatory Visit: Payer: Self-pay

## 2019-02-09 ENCOUNTER — Inpatient Hospital Stay (HOSPITAL_COMMUNITY)
Admission: EM | Admit: 2019-02-09 | Discharge: 2019-02-18 | DRG: 177 | Disposition: A | Discharge status: Home / Self Care | Payer: Medicare Other | Attending: Internal Medicine | Admitting: Internal Medicine

## 2019-02-09 DIAGNOSIS — N400 Enlarged prostate without lower urinary tract symptoms: Secondary | ICD-10-CM | POA: Diagnosis not present

## 2019-02-09 DIAGNOSIS — I959 Hypotension, unspecified: Secondary | ICD-10-CM | POA: Diagnosis not present

## 2019-02-09 DIAGNOSIS — J9811 Atelectasis: Secondary | ICD-10-CM | POA: Diagnosis present

## 2019-02-09 DIAGNOSIS — E785 Hyperlipidemia, unspecified: Secondary | ICD-10-CM | POA: Diagnosis not present

## 2019-02-09 DIAGNOSIS — J9601 Acute respiratory failure with hypoxia: Secondary | ICD-10-CM | POA: Diagnosis present

## 2019-02-09 DIAGNOSIS — K625 Hemorrhage of anus and rectum: Secondary | ICD-10-CM | POA: Diagnosis not present

## 2019-02-09 DIAGNOSIS — R531 Weakness: Secondary | ICD-10-CM | POA: Diagnosis not present

## 2019-02-09 DIAGNOSIS — K922 Gastrointestinal hemorrhage, unspecified: Secondary | ICD-10-CM

## 2019-02-09 DIAGNOSIS — K5900 Constipation, unspecified: Secondary | ICD-10-CM | POA: Diagnosis present

## 2019-02-09 DIAGNOSIS — R739 Hyperglycemia, unspecified: Secondary | ICD-10-CM | POA: Diagnosis present

## 2019-02-09 DIAGNOSIS — D62 Acute posthemorrhagic anemia: Secondary | ICD-10-CM | POA: Diagnosis present

## 2019-02-09 DIAGNOSIS — U071 COVID-19: Principal | ICD-10-CM | POA: Diagnosis present

## 2019-02-09 DIAGNOSIS — Z79899 Other long term (current) drug therapy: Secondary | ICD-10-CM | POA: Diagnosis not present

## 2019-02-09 DIAGNOSIS — R0602 Shortness of breath: Secondary | ICD-10-CM | POA: Diagnosis not present

## 2019-02-09 DIAGNOSIS — R7989 Other specified abnormal findings of blood chemistry: Secondary | ICD-10-CM | POA: Diagnosis not present

## 2019-02-09 DIAGNOSIS — J1289 Other viral pneumonia: Secondary | ICD-10-CM | POA: Diagnosis present

## 2019-02-09 DIAGNOSIS — D649 Anemia, unspecified: Secondary | ICD-10-CM | POA: Diagnosis present

## 2019-02-09 DIAGNOSIS — I10 Essential (primary) hypertension: Secondary | ICD-10-CM

## 2019-02-09 DIAGNOSIS — Z833 Family history of diabetes mellitus: Secondary | ICD-10-CM | POA: Diagnosis not present

## 2019-02-09 DIAGNOSIS — J8410 Pulmonary fibrosis, unspecified: Secondary | ICD-10-CM | POA: Diagnosis present

## 2019-02-09 DIAGNOSIS — Z791 Long term (current) use of non-steroidal anti-inflammatories (NSAID): Secondary | ICD-10-CM | POA: Diagnosis not present

## 2019-02-09 DIAGNOSIS — R0689 Other abnormalities of breathing: Secondary | ICD-10-CM | POA: Diagnosis not present

## 2019-02-09 DIAGNOSIS — K219 Gastro-esophageal reflux disease without esophagitis: Secondary | ICD-10-CM | POA: Diagnosis present

## 2019-02-09 DIAGNOSIS — Z7951 Long term (current) use of inhaled steroids: Secondary | ICD-10-CM

## 2019-02-09 DIAGNOSIS — R42 Dizziness and giddiness: Secondary | ICD-10-CM | POA: Diagnosis not present

## 2019-02-09 DIAGNOSIS — E1165 Type 2 diabetes mellitus with hyperglycemia: Secondary | ICD-10-CM | POA: Diagnosis not present

## 2019-02-09 LAB — HEMOGLOBIN A1C
Hgb A1c MFr Bld: 5.7 % — ABNORMAL HIGH (ref 4.8–5.6)
Hgb A1c MFr Bld: 5.8 % — ABNORMAL HIGH (ref 4.8–5.6)
Mean Plasma Glucose: 116.89 mg/dL
Mean Plasma Glucose: 119.76 mg/dL

## 2019-02-09 LAB — CBC
HCT: 23.8 % — ABNORMAL LOW (ref 39.0–52.0)
Hemoglobin: 7.5 g/dL — ABNORMAL LOW (ref 13.0–17.0)
MCH: 28.2 pg (ref 26.0–34.0)
MCHC: 31.5 g/dL (ref 30.0–36.0)
MCV: 89.5 fL (ref 80.0–100.0)
Platelets: 310 10*3/uL (ref 150–400)
RBC: 2.66 MIL/uL — ABNORMAL LOW (ref 4.22–5.81)
RDW: 15.9 % — ABNORMAL HIGH (ref 11.5–15.5)
WBC: 9.8 10*3/uL (ref 4.0–10.5)
nRBC: 1.8 % — ABNORMAL HIGH (ref 0.0–0.2)

## 2019-02-09 LAB — COMPREHENSIVE METABOLIC PANEL
ALT: 18 U/L (ref 0–44)
ALT: 15 U/L (ref 0–44)
AST: 19 U/L (ref 15–41)
AST: 17 U/L (ref 15–41)
Albumin: 2.7 g/dL — ABNORMAL LOW (ref 3.5–5.0)
Albumin: 2.4 g/dL — ABNORMAL LOW (ref 3.5–5.0)
Alkaline Phosphatase: 66 U/L (ref 38–126)
Alkaline Phosphatase: 57 U/L (ref 38–126)
Anion gap: 8 (ref 5–15)
Anion gap: 13 (ref 5–15)
BUN: 18 mg/dL (ref 8–23)
BUN: 17 mg/dL (ref 8–23)
CO2: 25 mmol/L (ref 22–32)
CO2: 21 mmol/L — ABNORMAL LOW (ref 22–32)
Calcium: 8.6 mg/dL — ABNORMAL LOW (ref 8.9–10.3)
Calcium: 8.4 mg/dL — ABNORMAL LOW (ref 8.9–10.3)
Chloride: 107 mmol/L (ref 98–111)
Chloride: 107 mmol/L (ref 98–111)
Creatinine, Ser: 0.87 mg/dL (ref 0.61–1.24)
Creatinine, Ser: 0.8 mg/dL (ref 0.61–1.24)
GFR calc Af Amer: >60 mL/min (ref >60)
GFR calc Af Amer: >60 mL/min (ref >60)
GFR calc non Af Amer: >60 mL/min (ref >60)
GFR calc non Af Amer: >60 mL/min (ref >60)
Glucose, Bld: 149 mg/dL — ABNORMAL HIGH (ref 70–99)
Glucose, Bld: 109 mg/dL — ABNORMAL HIGH (ref 70–99)
Potassium: 4.4 mmol/L (ref 3.5–5.1)
Potassium: 4.2 mmol/L (ref 3.5–5.1)
Sodium: 140 mmol/L (ref 135–145)
Sodium: 141 mmol/L (ref 135–145)
Total Bilirubin: 0.5 mg/dL (ref 0.3–1.2)
Total Bilirubin: 0.7 mg/dL (ref 0.3–1.2)
Total Protein: 6.8 g/dL (ref 6.5–8.1)
Total Protein: 5.8 g/dL — ABNORMAL LOW (ref 6.5–8.1)

## 2019-02-09 LAB — CBC WITH DIFFERENTIAL/PLATELET
Abs Immature Granulocytes: 0.14 10*3/uL — ABNORMAL HIGH (ref 0.00–0.07)
Basophils Absolute: 0 10*3/uL (ref 0.0–0.1)
Basophils Relative: 0 %
Eosinophils Absolute: 0.6 10*3/uL — ABNORMAL HIGH (ref 0.0–0.5)
Eosinophils Relative: 4 %
HCT: 21.9 % — ABNORMAL LOW (ref 39.0–52.0)
Hemoglobin: 6.9 g/dL — CL (ref 13.0–17.0)
Immature Granulocytes: 1 %
Lymphocytes Relative: 13 %
Lymphs Abs: 1.8 10*3/uL (ref 0.7–4.0)
MCH: 29.9 pg (ref 26.0–34.0)
MCHC: 31.5 g/dL (ref 30.0–36.0)
MCV: 94.8 fL (ref 80.0–100.0)
Monocytes Absolute: 1.2 10*3/uL — ABNORMAL HIGH (ref 0.1–1.0)
Monocytes Relative: 9 %
Neutro Abs: 10.4 10*3/uL — ABNORMAL HIGH (ref 1.7–7.7)
Neutrophils Relative %: 73 %
Platelets: ADEQUATE 10*3/uL (ref 150–400)
RBC: 2.31 MIL/uL — ABNORMAL LOW (ref 4.22–5.81)
RDW: 13.2 % (ref 11.5–15.5)
WBC: 14.2 10*3/uL — ABNORMAL HIGH (ref 4.0–10.5)
nRBC: 0.8 % — ABNORMAL HIGH (ref 0.0–0.2)

## 2019-02-09 LAB — D-DIMER, QUANTITATIVE: D-Dimer, Quant: 1.82 ug/mL-FEU — ABNORMAL HIGH (ref 0.00–0.50)

## 2019-02-09 LAB — IRON AND TIBC
Iron: 236 ug/dL — ABNORMAL HIGH (ref 45–182)
Saturation Ratios: 82 % — ABNORMAL HIGH (ref 17.9–39.5)
TIBC: 288 ug/dL (ref 250–450)
UIBC: 52 ug/dL

## 2019-02-09 LAB — SEDIMENTATION RATE: Sed Rate: 70 mm/hr — ABNORMAL HIGH (ref 0–16)

## 2019-02-09 LAB — SARS CORONAVIRUS 2 BY RT PCR (HOSPITAL ORDER, PERFORMED IN ~~LOC~~ HOSPITAL LAB): SARS Coronavirus 2: POSITIVE — AB

## 2019-02-09 LAB — HEMOGLOBIN AND HEMATOCRIT, BLOOD
HCT: 15.1 % — ABNORMAL LOW (ref 39.0–52.0)
Hemoglobin: 4.7 g/dL — CL (ref 13.0–17.0)

## 2019-02-09 LAB — FERRITIN: Ferritin: 118 ng/mL (ref 24–336)

## 2019-02-09 LAB — ABO/RH: ABO/RH(D): A POS

## 2019-02-09 LAB — CBG MONITORING, ED: Glucose-Capillary: 184 mg/dL — ABNORMAL HIGH (ref 70–99)

## 2019-02-09 LAB — TRIGLYCERIDES: Triglycerides: 130 mg/dL (ref <150)

## 2019-02-09 LAB — POC OCCULT BLOOD, ED: Fecal Occult Bld: POSITIVE — AB

## 2019-02-09 LAB — C-REACTIVE PROTEIN: CRP: 1.4 mg/dL — ABNORMAL HIGH (ref <1.0)

## 2019-02-09 LAB — PROTIME-INR
INR: 1 (ref 0.8–1.2)
Prothrombin Time: 13.3 seconds (ref 11.4–15.2)

## 2019-02-09 LAB — VITAMIN B12: Vitamin B-12: 536 pg/mL (ref 180–914)

## 2019-02-09 LAB — GLUCOSE, CAPILLARY: Glucose-Capillary: 117 mg/dL — ABNORMAL HIGH (ref 70–99)

## 2019-02-09 LAB — PREPARE RBC (CROSSMATCH)

## 2019-02-09 LAB — FOLATE: Folate: 8.1 ng/mL (ref >5.9)

## 2019-02-09 MED ORDER — CARVEDILOL 25 MG PO TABS
25.0000 mg | ORAL_TABLET | Freq: Two times a day (BID) | ORAL | Status: DC
  Administered 2019-02-09 – 2019-02-18 (×18): 25 mg via ORAL
  Filled 2019-02-09 (×18): qty 1

## 2019-02-09 MED ORDER — PANTOPRAZOLE SODIUM 40 MG IV SOLR
40.0000 mg | Freq: Once | INTRAVENOUS | Status: AC
  Administered 2019-02-09: 40 mg via INTRAVENOUS
  Filled 2019-02-09: qty 40

## 2019-02-09 MED ORDER — SODIUM CHLORIDE 0.9% IV SOLUTION
Freq: Once | INTRAVENOUS | Status: AC
  Administered 2019-02-09: 10 mL/h via INTRAVENOUS

## 2019-02-09 MED ORDER — SODIUM CHLORIDE 0.9 % IV SOLN
8.0000 mg/h | INTRAVENOUS | Status: AC
  Administered 2019-02-10 – 2019-02-12 (×6): 8 mg/h via INTRAVENOUS
  Filled 2019-02-09 (×8): qty 80

## 2019-02-09 MED ORDER — HYDROCOD POLST-CPM POLST ER 10-8 MG/5ML PO SUER: 5.0000 mL | Freq: Two times a day (BID) | ORAL | Status: DC | PRN

## 2019-02-09 MED ORDER — OXYCODONE HCL 5 MG PO TABS
5.0000 mg | ORAL_TABLET | ORAL | Status: DC | PRN
  Administered 2019-02-16: 5 mg via ORAL
  Filled 2019-02-09: qty 1

## 2019-02-09 MED ORDER — ATORVASTATIN CALCIUM 40 MG PO TABS
40.0000 mg | ORAL_TABLET | Freq: Every day | ORAL | Status: DC
  Administered 2019-02-10 – 2019-02-17 (×8): 40 mg via ORAL
  Filled 2019-02-09 (×8): qty 1

## 2019-02-09 MED ORDER — PANTOPRAZOLE SODIUM 40 MG IV SOLR
40.0000 mg | Freq: Two times a day (BID) | INTRAVENOUS | Status: DC
  Administered 2019-02-13 – 2019-02-16 (×8): 40 mg via INTRAVENOUS
  Filled 2019-02-09 (×8): qty 40

## 2019-02-09 MED ORDER — ZINC SULFATE 220 (50 ZN) MG PO CAPS
220.0000 mg | ORAL_CAPSULE | Freq: Every day | ORAL | Status: DC
  Administered 2019-02-09 – 2019-02-18 (×10): 220 mg via ORAL
  Filled 2019-02-09 (×10): qty 1

## 2019-02-09 MED ORDER — SODIUM CHLORIDE 0.9 % IV SOLN
100.0000 mg | INTRAVENOUS | Status: AC
  Administered 2019-02-10 – 2019-02-13 (×4): 100 mg via INTRAVENOUS
  Filled 2019-02-09 (×4): qty 20

## 2019-02-09 MED ORDER — GUAIFENESIN-DM 100-10 MG/5ML PO SYRP
10.0000 mL | ORAL_SOLUTION | ORAL | Status: DC | PRN
  Administered 2019-02-11 – 2019-02-14 (×3): 10 mL via ORAL
  Filled 2019-02-09 (×4): qty 10

## 2019-02-09 MED ORDER — SODIUM CHLORIDE 0.9 % IV SOLN
200.0000 mg | Freq: Once | INTRAVENOUS | Status: AC
  Administered 2019-02-09: 200 mg via INTRAVENOUS
  Filled 2019-02-09: qty 40

## 2019-02-09 MED ORDER — ACETAMINOPHEN 650 MG RE SUPP: 650.0000 mg | Freq: Four times a day (QID) | RECTAL | Status: DC | PRN

## 2019-02-09 MED ORDER — TAMSULOSIN HCL 0.4 MG PO CAPS
0.4000 mg | ORAL_CAPSULE | Freq: Every day | ORAL | Status: DC
  Administered 2019-02-09 – 2019-02-18 (×10): 0.4 mg via ORAL
  Filled 2019-02-09 (×10): qty 1

## 2019-02-09 MED ORDER — INSULIN ASPART 100 UNIT/ML ~~LOC~~ SOLN: 0.0000 [IU] | Freq: Every day | SUBCUTANEOUS | Status: DC

## 2019-02-09 MED ORDER — INSULIN ASPART 100 UNIT/ML ~~LOC~~ SOLN
0.0000 [IU] | Freq: Three times a day (TID) | SUBCUTANEOUS | Status: DC
  Administered 2019-02-11 (×3): 1 [IU] via SUBCUTANEOUS
  Administered 2019-02-12: 2 [IU] via SUBCUTANEOUS
  Administered 2019-02-12 – 2019-02-18 (×14): 1 [IU] via SUBCUTANEOUS

## 2019-02-09 MED ORDER — SODIUM CHLORIDE 0.9% IV SOLUTION: Freq: Once | INTRAVENOUS | Status: DC

## 2019-02-09 MED ORDER — VITAMIN C 500 MG PO TABS
500.0000 mg | ORAL_TABLET | Freq: Every day | ORAL | Status: DC
  Administered 2019-02-09 – 2019-02-18 (×10): 500 mg via ORAL
  Filled 2019-02-09 (×10): qty 1

## 2019-02-09 MED ORDER — ACETAMINOPHEN 325 MG PO TABS
650.0000 mg | ORAL_TABLET | Freq: Four times a day (QID) | ORAL | Status: DC | PRN
  Administered 2019-02-11 – 2019-02-18 (×9): 650 mg via ORAL
  Filled 2019-02-09 (×10): qty 2

## 2019-02-09 MED ORDER — DEXAMETHASONE SODIUM PHOSPHATE 10 MG/ML IJ SOLN
6.0000 mg | INTRAMUSCULAR | Status: DC
  Administered 2019-02-10 – 2019-02-15 (×7): 6 mg via INTRAVENOUS
  Filled 2019-02-09 (×8): qty 0.6

## 2019-02-09 NOTE — ED Triage Notes (Signed)
Patient arrived by EMS from home. Pt c/o weakness and SOB for the past week. Pt went to primary care doctor and was placed on 2L [place] at home per EMS. PT stated they did chest X-Ray and nothing was found at primary care per EMS.   Pt has had increasing weakness and SOB since yesterday. Family stated patient has had dark stools.   Hx of HTN

## 2019-02-09 NOTE — Progress Notes (Signed)
Pharmacy: Remdesivir   Patient is a 68 y.o. M with COVID.  Pharmacy has been consulted for remdesivir dosing.   - ALT: 18 - CXR shows: "Mild right pleural effusion is noted with associated right basilar atelectasis or infiltrate" - Pt is requiring supplemental oxygen: [place] 2L    A/P:  - Patient meets criteria for remdesivir. Will initiate remdesivir 200 mg once followed by 100 mg daily x 4 days.  - Daily CMET while on remdesivir - Will f/u pt's ALT and clinical condition  Dia Sitter, PharmD, BCPS 02/09/2019 3:41 PM

## 2019-02-09 NOTE — Progress Notes (Signed)
CRITICAL VALUE ALERT  Critical Value:  Hgb 4.7  Date & Time Notied:  02/09/19 at 1934  Provider Notified: [facility]  Orders Received/Actions taken: Notified via text page.

## 2019-02-09 NOTE — H&P (Signed)
History and Physical    Lark Runk CBJ:[id number] DOB: [birth year] DOA: 02/09/2019  PCP: Lawerance Cruel, MD  Patient coming from: home  I have personally briefly reviewed patient's old medical records in [place]  Chief Complaint: SOB  HPI: Klinton Candelas is Joelie Schou 68 y.o. male with medical history significant of granulomaatous lung disease, HTN, HLD and multiple other medical problems presenting with progressive SOB and weakness.   He was seen about 1 week ago in pulmonary clinic for similar symptoms.  C/o fatigue, weight loss, and SOB.  He reported that his O2 sats were in the 90's at home and was concerned about Maryuri Warnke recurrent pleural effusion.  He notes these symptoms have continued which have led him to present today.  He notes DOE.  No CP.  He denies fevers.  He denies abdominal pain.  He notes for at least the past day or so he's noticed dark red stools.  He notes his son was COVID 19 positive 2-3 weeks ago.    ED Course: Labs, imaging, pRBC transfusion.  Admit for GI bleed.  COVID 19 positive.   Review of Systems: As per HPI otherwise 10 point review of systems negative.    Past Medical History:  Diagnosis Date  . Abdominal pain   . Chest pain   . Fever   . Generalized headaches   . GERD (gastroesophageal reflux disease)   . Granulomatous lung disease ([place]) 2012   s/p R VATS  . Hyperlipidemia   . Hypertension   . Multiple allergies   . Nasal congestion     Past Surgical History:  Procedure Laterality Date  . Drainage of pleural effusion.  11/16/2010  . ENDOBRONCHIAL ULTRASOUND  11/16/2010  . FIBEROPTIC BRONCHOSCOPY  11/16/2010   Dr Arlyce Dice, ytology from [facility] on Nov 16, 2010, right lung   . Insertion of PleurX catheter.  11/16/2010  . Pleural biopsies.  11/16/2010  . Right VATS.  11/16/2010     reports that he has never smoked. He has never used smokeless tobacco. He reports current alcohol use. He reports that he does not use drugs.  No Known Allergies   Family History  Problem Relation Age of Onset  . Diabetes Mother   . Deep vein thrombosis Mother   . Allergies Son    Prior to Admission medications   Medication Sig Start Date End Date Taking? Authorizing Provider  atorvastatin (LIPITOR) 40 MG tablet Take 40 mg by mouth daily at 6 PM.  02/10/11  Yes [provider]  carvedilol (COREG) 25 MG tablet Take 25 mg by mouth 2 (two) times daily with Cerena Baine meal.  11/13/18  Yes [provider]  fluticasone (FLONASE) 50 MCG/ACT nasal spray Place 2 sprays into the nose daily as needed for allergies.    Yes [provider]  hydrocortisone 2.5 % cream Apply 1 application topically 2 (two) times daily.     Yes [provider]  ibuprofen (ADVIL) 800 MG tablet Take 800 mg by mouth every 6 (six) hours as needed for moderate pain.    Yes [provider]  tamsulosin (FLOMAX) 0.4 MG CAPS capsule Take 0.4 mg by mouth daily. 01/05/19  Yes [provider]  nebivolol (BYSTOLIC) 10 MG tablet One daily Patient not taking: Reported on 02/02/2019 12/13/10 02/09/19  Tanda Rockers, MD    Physical Exam: Vitals:   02/09/19 1400 02/09/19 1440 02/09/19 1445 02/09/19 1500  BP: 140/61 (!) 154/69 (!) 153/68 (!) 159/66  Pulse: 91  95 93 95  Resp: (!) 23 (!) 27 (!) 26 (!) 23  Temp:  98.5 F (36.9 C)  98.6 F (37 C)  TempSrc:  Oral  Oral  SpO2: 100% 100% 100% 100%  Weight:      Height:        Constitutional: NAD, calm, comfortable Vitals:   02/09/19 1400 02/09/19 1440 02/09/19 1445 02/09/19 1500  BP: 140/61 (!) 154/69 (!) 153/68 (!) 159/66  Pulse: 91 95 93 95  Resp: (!) 23 (!) 27 (!) 26 (!) 23  Temp:  98.5 F (36.9 C)  98.6 F (37 C)  TempSrc:  Oral  Oral  SpO2: 100% 100% 100% 100%  Weight:      Height:       Eyes: PERRL, lids and conjunctivae normal ENMT: Mucous membranes are moist. Posterior pharynx clear of any exudate or lesions.Normal dentition.  Neck: normal, supple, no masses, no thyromegaly Respiratory:  unlabored breathing, on supplemental O2 Cardiovascular: Regular rate and rhythm Abdomen: no tenderness, no masses palpated. No hepatosplenomegaly. Bowel sounds positive.  Musculoskeletal: no clubbing / cyanosis. No joint deformity upper and lower extremities. Good ROM, no contractures. Normal muscle tone.  Skin: no rashes, lesions, ulcers. No induration Neurologic: CN 2-12 grossly intact. Sensation intact.  Moving all extremities.[name] Kitchen  Psychiatric: Normal judgment and insight. Alert and oriented x 3. Normal mood.   Labs on Admission: I have personally reviewed following labs and imaging studies  CBC: Recent Labs  Lab 02/09/19 1240  WBC 14.2*  NEUTROABS 10.4*  HGB 6.9*  HCT 21.9*  MCV 94.8  PLT PLATELET CLUMPS NOTED ON SMEAR, COUNT APPEARS ADEQUATE   Basic Metabolic Panel: Recent Labs  Lab 02/09/19 1240  NA 140  K 4.4  CL 107  CO2 25  GLUCOSE 149*  BUN 18  CREATININE 0.87  CALCIUM 8.6*   GFR: Estimated Creatinine Clearance: 89.2 mL/min (by C-G formula based on SCr of 0.87 mg/dL). Liver Function Tests: Recent Labs  Lab 02/09/19 1240  AST 19  ALT 18  ALKPHOS 66  BILITOT 0.5  PROT 6.8  ALBUMIN 2.7*   No results for input(s): LIPASE, AMYLASE in the last 168 hours. No results for input(s): AMMONIA in the last 168 hours. Coagulation Profile: Recent Labs  Lab 02/09/19 1240  INR 1.0   Cardiac Enzymes: No results for input(s): CKTOTAL, CKMB, CKMBINDEX, TROPONINI in the last 168 hours. BNP (last 3 results) No results for input(s): PROBNP in the last 8760 hours. HbA1C: No results for input(s): HGBA1C in the last 72 hours. CBG: Recent Labs  Lab 02/09/19 1124  GLUCAP 184*   Lipid Profile: No results for input(s): CHOL, HDL, LDLCALC, TRIG, CHOLHDL, LDLDIRECT in the last 72 hours. Thyroid Function Tests: No results for input(s): TSH, T4TOTAL, FREET4, T3FREE, THYROIDAB in the last 72 hours. Anemia Panel: No results for input(s): VITAMINB12, FOLATE, FERRITIN, TIBC,  IRON, RETICCTPCT in the last 72 hours. Urine analysis:    Component Value Date/Time   COLORURINE YELLOW 11/19/2010 1021   APPEARANCEUR CLOUDY (Nadim Malia) 11/19/2010 1021   LABSPEC 1.026 11/19/2010 1021   PHURINE 7.0 11/19/2010 1021   GLUCOSEU NEGATIVE 11/19/2010 1021   HGBUR NEGATIVE 11/19/2010 1021   BILIRUBINUR NEGATIVE 11/19/2010 1021   KETONESUR NEGATIVE 11/19/2010 1021   PROTEINUR NEGATIVE 11/19/2010 1021   UROBILINOGEN 1.0 11/19/2010 1021   NITRITE NEGATIVE 11/19/2010 1021   LEUKOCYTESUR  11/19/2010 1021    NEGATIVE MICROSCOPIC NOT DONE ON URINES WITH NEGATIVE PROTEIN, BLOOD, LEUKOCYTES, NITRITE, OR GLUCOSE <1000 mg/dL.  Radiological Exams on Admission: Dg Chest 1 View  Result Date: 02/09/2019 CLINICAL DATA:  Shortness of breath. EXAM: CHEST  1 VIEW COMPARISON:  Radiographs of February 02, 2019. FINDINGS: Stable cardiomediastinal silhouette. No pneumothorax is noted. Left lung opacities noted on prior exam are nearly resolved currently. Mild right pleural effusion is noted with associated atelectasis or infiltrate. Bony thorax is unremarkable. IMPRESSION: Mild right pleural effusion is noted with associated right basilar atelectasis or infiltrate. Electronically Signed   By: Marijo Conception M.D.   On: 02/09/2019 13:06    EKG: Independently reviewed. none  Assessment/Plan Active Problems:   COVID-19 virus infection   Shortness of Breath  Acute Hypoxic Respiratory Failure: multifactorial 2/2 COVID 19 pneumonia and ABLA.   See below  AHRF 2/2 COVID 19 Pneumonia: exposure to son 2-3 weeks ago.  He was tested maybe 2 weeks ago and negative.  Of note, he had increased SOB about 1 week ago and was seen in pulmonary clinic (I've notified MD about his positive status).  He was started on O2.  He had CXR on 8/11 concerning for multifocal opacities R>L concerning for multifocal pneumonia with small R effusion. CXR today with mild R effusion with R basilar atelectasis or infiltrate Start  remdesivir as suspect his hypoxia [place] week ago was related to COVID 19 pneumonia Start dexamethasone Check inflammatory labs and follow daily Unable to place on DVT ppx due to ABLA and active GI bleed Prone as able I/O, daily weights  Acute Blood Loss Anemia 2/2 GI Bleed: describes as maroon stool.  He takes ibuprofen occasionally. Hb down to 6.9 from 12.7 in 2012 Transfuse 1 unit pRBC Place on PPI gtt Trend Hb q8 hrs Discussed with GI, no plans for scope given COVID 19 positive status.  If hemodynamically unstable, will need to call back.  Follow iron, b12, folate, ferritin  Leukocytosis:  Mild, continue to monitor.    BPH: flomax  HTN: coreg  HLD: continue lipitor  Hyperglycemia: follow A1c.  SSI.  DVT prophylaxis: SCD  Code Status: full  Family Communication: wife  Disposition Plan: pending  Consults called: GI Admission status: inpatient   Fayrene Helper MD [facility] Pager AMION  If 7PM-7AM, please contact night-coverage [email or web address] Password [email or web address]  02/09/2019, 5:22 PM

## 2019-02-09 NOTE — ED Provider Notes (Addendum)
[facility] DEPT Provider Note   CSN: [id number] Arrival date & time: 02/09/19  1106     History   Chief Complaint Chief Complaint  Patient presents with  . Weakness  . Rectal Bleeding    HPI Larry [name] is a 68 y.o. male.     Patient with history of granulomatous lung disease, pulmonary fibrosis, now oxygen dependent on 2 L at home --presents to the emergency department with progressive shortness of breath and weakness over the past 2 weeks.  He gets very short of breath with any activity.  Patient has had some dark blood noted in his stool.  He states that he takes ibuprofen every day for general pains.  He denies any fever or cough.  Patient had a pleural effusion treated by VATS procedure in 2012 and he was concerned that he was reaccumulating fluid in his lungs.  He had a chest x-ray done by his pulmonologist 1 week ago which showed some questionable infiltrates, fibrosis, small right pleural effusion.  He denies any vomiting or diarrhea.  No lower extremity edema.  Patient states that he felt too bad and could not wait to follow back up with his lung doctor so he presented to the emergency department today by ambulance.      Past Medical History:  Diagnosis Date  . Abdominal pain   . Chest pain   . Fever   . Generalized headaches   . GERD (gastroesophageal reflux disease)   . Granulomatous lung disease ([place]) 2012   s/p R VATS  . Hyperlipidemia   . Hypertension   . Multiple allergies   . Nasal congestion     Patient Active Problem List   Diagnosis Date Noted  . 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 Chronic rhinitis 05/16/2011  . Cough 12/13/2010  . Hypertension 12/13/2010  . Unspecified pleural effusion 10/31/2010    Past Surgical History:  Procedure Laterality Date  . Drainage of pleural effusion.  11/16/2010  . ENDOBRONCHIAL ULTRASOUND  11/16/2010  . FIBEROPTIC BRONCHOSCOPY  11/16/2010   Dr Arlyce Dice, ytology from [facility]  on Nov 16, 2010, right lung   . Insertion of PleurX catheter.  11/16/2010  . Pleural biopsies.  11/16/2010  . Right VATS.  11/16/2010        Home Medications    Prior to Admission medications   Medication Sig Start Date End Date Taking? Authorizing Provider  acetaminophen-codeine (TYLENOL #3) 300-30 MG per tablet Take 1 tablet by mouth every 4 (four) hours as needed. Patient not taking: Reported on 02/02/2019 01/14/12   Tanda Rockers, MD  atorvastatin (LIPITOR) 40 MG tablet Take 1 tablet by mouth daily. 02/10/11   [provider]  carvedilol (COREG) 25 MG tablet  11/13/18   [provider]  fluticasone (FLONASE) 50 MCG/ACT nasal spray Place 2 sprays into the nose daily as needed.     [provider]  hydrochlorothiazide (HYDRODIURIL) 25 MG tablet Take 12.5 mg by mouth daily.    [provider]  hydrocortisone 2.5 % cream Apply 1 application topically 2 (two) times daily.      [provider]  ibuprofen (ADVIL,MOTRIN) 200 MG tablet Take 200 mg by mouth every 6 (six) hours as needed.      [provider]  mometasone (ELOCON) 0.1 % cream Apply 1 application topically daily.    [provider]  nebivolol (BYSTOLIC) 10 MG tablet One daily Patient not taking: Reported on 02/02/2019 12/13/10   Christinia Gully  B, MD  pantoprazole (PROTONIX) 40 MG tablet Take 40 mg by mouth daily as needed. 08/01/11 07/31/12  Tanda Rockers, MD  Phenylephrine-Ibuprofen (ADVIL CONGESTION RELIEF PO) Per bottle directions as needed     [provider]    Family History Family History  Problem Relation Age of Onset  . Diabetes Mother   . Deep vein thrombosis Mother   . Allergies Son     Social History Social History   Tobacco Use  . Smoking status: Never Smoker  . Smokeless tobacco: Never Used  Substance Use Topics  . Alcohol use: Yes    Comment: occasional  . Drug use: No     Allergies   Patient has no known allergies.   Review of  Systems Review of Systems  Constitutional: Positive for fatigue. Negative for chills and fever.  HENT: Negative for rhinorrhea and sore throat.   Eyes: Negative for redness.  Respiratory: Positive for shortness of breath. Negative for cough.   Cardiovascular: Negative for chest pain.  Gastrointestinal: Positive for blood in stool. Negative for abdominal pain, diarrhea, nausea and vomiting.  Genitourinary: Negative for dysuria.  Musculoskeletal: Negative for myalgias.  Skin: Negative for rash.  Neurological: Positive for weakness. Negative for headaches.  Hematological: Does not bruise/bleed easily.     Physical Exam Updated Vital Signs BP 136/63 (BP Location: Left Arm)   Pulse 96   Temp 98.2 F (36.8 C) (Oral)   Resp (!) 24   Ht 6' (1.829 m)   Wt 88.5 kg   SpO2 93%   BMI 26.45 kg/m   Physical Exam Vitals signs and nursing note reviewed.  Constitutional:      Appearance: He is well-developed.  HENT:     Head: Normocephalic and atraumatic.  Eyes:     General:        Right eye: No discharge.        Left eye: No discharge.     Comments: Pale conjunctiva  Neck:     Musculoskeletal: Normal range of motion and neck supple.  Cardiovascular:     Rate and Rhythm: Normal rate and regular rhythm.     Heart sounds: Normal heart sounds.  Pulmonary:     Effort: Pulmonary effort is normal.     Breath sounds: Rales present.     Comments: Scattered rales throughout Abdominal:     Palpations: Abdomen is soft.     Tenderness: There is no abdominal tenderness. There is no guarding or rebound.  Genitourinary:    Rectum: Guaiac result positive. External hemorrhoid present.     Comments: Patient with inflamed external hemorrhoid but does not appear to be actively bleeding.  Minimal discomfort and tenderness.  Maroon blood noted on gloved finger. Skin:    General: Skin is warm and dry.     Comments: Pale nailbeds with sluggish capillary refill  Neurological:     Mental Status: He is  alert.      ED Treatments / Results  Labs (all labs ordered are listed, but only abnormal results are displayed) Labs Reviewed  COMPREHENSIVE METABOLIC PANEL - Abnormal; Notable for the following components:      Result Value   Glucose, Bld 149 (*)    Calcium 8.6 (*)    Albumin 2.7 (*)    All other components within normal limits  CBC WITH DIFFERENTIAL/PLATELET - Abnormal; Notable for the following components:   WBC 14.2 (*)    RBC 2.31 (*)    Hemoglobin 6.9 (*)  HCT 21.9 (*)    nRBC 0.8 (*)    Neutro Abs 10.4 (*)    Monocytes Absolute 1.2 (*)    Eosinophils Absolute 0.6 (*)    Abs Immature Granulocytes 0.14 (*)    All other components within normal limits  CBG MONITORING, ED - Abnormal; Notable for the following components:   Glucose-Capillary 184 (*)    All other components within normal limits  POC OCCULT BLOOD, ED - Abnormal; Notable for the following components:   Fecal Occult Bld POSITIVE (*)    All other components within normal limits  SARS CORONAVIRUS 2 (HOSPITAL ORDER, [facility])  PROTIME-INR  IRON AND TIBC  VITAMIN B12  FOLATE  FERRITIN  HEMOGLOBIN AND HEMATOCRIT, BLOOD  HEMOGLOBIN AND HEMATOCRIT, BLOOD  TYPE AND SCREEN  PREPARE RBC (CROSSMATCH)  ABO/RH    EKG None  Radiology No results found.  Procedures Procedures (including critical care time)  Medications Ordered in ED Medications - No data to display   Initial Impression / Assessment and Plan / ED Course  I have reviewed the triage vital signs and the nursing notes.  Pertinent labs & imaging results that were available during my care of the patient were reviewed by me and considered in my medical decision making (see chart for details).        Patient seen and examined. Discussed with Dr. Maryan Rued who will see patient.  Work-up initiated.  Rectal exam performed by myself with NT chaperone.  Vital signs reviewed and are as follows: BP 136/63 (BP  Location: Left Arm)   Pulse 96   Temp 98.2 F (36.8 C) (Oral)   Resp (!) 24   Ht 6' (1.829 m)   Wt 88.5 kg   SpO2 93%   BMI 26.45 kg/m   2:22 PM Pt updated on results.  Discussed with and seen earlier by Dr. Maryan Rued.  Patient with hemoglobin of 6.9.  Patient agrees to be transfused 1 unit of blood given his symptoms.  Agrees to admission.  Discussed with Dr. Florene Glen, [facility] who will see in the ED.  I have ordered Protonix given patient's recent ibuprofen use.  We will touch base with [facility] (patient sees in [place] primary care doctor) to request consult.  CRITICAL CARE Performed by: Carlisle Cater PA-C Total critical care time: 40 minutes Critical care time was exclusive of separately billable procedures and treating other patients. Critical care was necessary to treat or prevent imminent or life-threatening deterioration. Critical care was time spent personally by me on the following activities: development of treatment plan with patient and/or surrogate as well as nursing, discussions with consultants, evaluation of patient's response to treatment, examination of patient, obtaining history from patient or surrogate, ordering and performing treatments and interventions, ordering and review of laboratory studies, ordering and review of radiographic studies, pulse oximetry and re-evaluation of patient's condition.  2:48 PM Notified that patient is COVID positive. Admitting physician informed.   2:52 PM Spoke with Dr. Michail Sermon, [facility], will place on consult list.   Final Clinical Impressions(s) / ED Diagnoses   Final diagnoses:  Shortness of breath  Symptomatic anemia  Lower GI bleeding   Acute to subacute lower GI bleed with symptomatic anemia in setting of chronic lung disease.  Patient to be admitted for transfusion, evaluation of lower GI bleed.   ED Discharge Orders    None       Carlisle Cater, PA-C 02/09/19 1424    Carlisle Cater,  PA-C 02/09/19 1448     Carlisle Cater, PA-C 02/09/19 1452    Blanchie Dessert, MD 02/11/19 1747

## 2019-02-09 NOTE — ED Notes (Signed)
ED TO INPATIENT HANDOFF REPORT  ED Nurse Name and Phone #: Melvern Sample Name/Age/Gender Larry [name] 68 y.o. male Room/Bed: WA23/WA23  Code Status   Code Status: Not on file  Home/SNF/Other Given to floor Patient oriented to: self, place, time and situation Is this baseline? Yes   Triage Complete: Triage complete  Chief Complaint weakness  Triage Note Patient arrived by EMS from home. Pt c/o weakness and SOB for the past week. Pt went to primary care doctor and was placed on 2L [place] at home per EMS. PT stated they did chest X-Ray and nothing was found at primary care per EMS.   Pt has had increasing weakness and SOB since yesterday. Family stated patient has had dark stools.   Hx of HTN    Allergies No Known Allergies  Level of Care/Admitting Diagnosis ED Disposition    ED Disposition Condition Comment   Admit  Hospital Area: [facility] [[id number]]  Level of Care: Telemetry [5]  Admit to tele based on following criteria: Other see comments  Comments: GI bleed  Covid Evaluation: Confirmed COVID Positive  Diagnosis: COVID-19 virus infection [[id number]]  Admitting Physician: Elodia Florence [phone]  Attending Physician: Cephus Slater, A CALDWELL [phone]  Estimated length of stay: past midnight tomorrow  Certification:: I certify this patient will need inpatient services for at least 2 midnights  PT Class (Do Not Modify): Inpatient [101]  PT Acc Code (Do Not Modify): Private [1]       B Medical/Surgery History Past Medical History:  Diagnosis Date  . Abdominal pain   . Chest pain   . Fever   . Generalized headaches   . GERD (gastroesophageal reflux disease)   . Granulomatous lung disease ([place]) 2012   s/p R VATS  . Hyperlipidemia   . Hypertension   . Multiple allergies   . Nasal congestion    Past Surgical History:  Procedure Laterality Date  . Drainage of pleural effusion.  11/16/2010  . ENDOBRONCHIAL ULTRASOUND  11/16/2010  .  FIBEROPTIC BRONCHOSCOPY  11/16/2010   Dr Arlyce Dice, ytology from [facility] on Nov 16, 2010, right lung   . Insertion of PleurX catheter.  11/16/2010  . Pleural biopsies.  11/16/2010  . Right VATS.  11/16/2010     A IV Location/Drains/Wounds Patient Lines/Drains/Airways Status   Active Line/Drains/Airways    Name:   Placement date:   Placement time:   Site:   Days:   Peripheral IV 07/07/18 Anterior;Right Antecubital   07/07/18    0930    Antecubital   217   Peripheral IV 02/09/19 Right Antecubital   02/09/19    1353    Antecubital   less than 1          Intake/Output Last 24 hours  Intake/Output Summary (Last 24 hours) at 02/09/2019 1712 Last data filed at 02/09/2019 1440 Gross per 24 hour  Intake 315 ml  Output -  Net 315 ml    Labs/Imaging Results for orders placed or performed during the hospital encounter of 02/09/19 (from the past 48 hour(s))  CBG monitoring, ED     Status: Abnormal   Collection Time: 02/09/19 11:24 AM  Result Value Ref Range   Glucose-Capillary 184 (H) 70 - 99 mg/dL  ABO/Rh     Status: None   Collection Time: 02/09/19 11:56 AM  Result Value Ref Range   ABO/RH(D)      A POS Performed at [facility], [address]  [place], [place] [address]   SARS Coronavirus 2 [facility] order, Performed in [facility] hospital lab) Nasopharyngeal Nasopharyngeal Swab     Status: Abnormal   Collection Time: 02/09/19 12:35 PM   Specimen: Nasopharyngeal Swab  Result Value Ref Range   SARS Coronavirus 2 POSITIVE (A) NEGATIVE    Comment: RESULT CALLED TO, READ BACK BY AND VERIFIED WITH: S.LEONARD AT 1431 ON 02/09/19 BY N.THOMPSON (NOTE) If result is NEGATIVE SARS-CoV-2 target nucleic acids are NOT DETECTED. The SARS-CoV-2 RNA is generally detectable in upper and lower  respiratory specimens during the acute phase of infection. The lowest  concentration of SARS-CoV-2 viral copies this assay can detect is 250  copies / mL. A negative result does not  preclude SARS-CoV-2 infection  and should not be used as the sole basis for treatment or other  patient management decisions.  A negative result may occur with  improper specimen collection / handling, submission of specimen other  than nasopharyngeal swab, presence of viral mutation(s) within the  areas targeted by this assay, and inadequate number of viral copies  (<250 copies / mL). A negative result must be combined with clinical  observations, patient history, and epidemiological information. If result is POSITIVE SARS-CoV-2 target nucleic acids are DETEC TED. The SARS-CoV-2 RNA is generally detectable in upper and lower  respiratory specimens during the acute phase of infection.  Positive  results are indicative of active infection with SARS-CoV-2.  Clinical  correlation with patient history and other diagnostic information is  necessary to determine patient infection status.  Positive results do  not rule out bacterial infection or co-infection with other viruses. If result is PRESUMPTIVE POSTIVE SARS-CoV-2 nucleic acids MAY BE PRESENT.   A presumptive positive result was obtained on the submitted specimen  and confirmed on repeat testing.  While 2019 novel coronavirus  (SARS-CoV-2) nucleic acids may be present in the submitted sample  additional confirmatory testing may be necessary for epidemiological  and / or clinical management purposes  to differentiate between  SARS-CoV-2 and other Sarbecovirus currently known to infect humans.  If clinically indicated additional testing with an alternate test  methodology (LAB7 [id number]) is advised. The SARS-CoV-2 RNA is generally  detectable in upper and lower respiratory specimens during the acute  phase of infection. The expected result is Negative. Fact Sheet for Patients:  StrictlyIdeas.no Fact Sheet for Healthcare Providers: BankingDealers.co.za This test is not yet approved or cleared by  the [place] FDA and has been authorized for detection and/or diagnosis of SARS-CoV-2 by FDA under an Emergency Use Authorization (EUA).  This EUA will remain in effect (meaning this test can be used) for the duration of the COVID-19 declaration under Section 564(b)(1) of the Act, 21 U.S.C. section 360bbb-3(b)(1), unless the authorization is terminated or revoked sooner. Performed at [facility], [place] [address], [place], [place] [address]   Comprehensive metabolic panel     Status: Abnormal   Collection Time: 02/09/19 12:40 PM  Result Value Ref Range   Sodium 140 135 - 145 mmol/L   Potassium 4.4 3.5 - 5.1 mmol/L   Chloride 107 98 - 111 mmol/L   CO2 25 22 - 32 mmol/L   Glucose, Bld 149 (H) 70 - 99 mg/dL   BUN 18 8 - 23 mg/dL   Creatinine, Ser 0.87 0.61 - 1.24 mg/dL   Calcium 8.6 (L) 8.9 - 10.3 mg/dL   Total Protein 6.8 6.5 - 8.1 g/dL   Albumin 2.7 (L) 3.5 - 5.0 g/dL  AST 19 15 - 41 U/L   ALT 18 0 - 44 U/L   Alkaline Phosphatase 66 38 - 126 U/L   Total Bilirubin 0.5 0.3 - 1.2 mg/dL   GFR calc non Af Amer >60 >60 mL/min   GFR calc Af Amer >60 >60 mL/min   Anion gap 8 5 - 15    Comment: Performed at [facility], [place] [address] [place], [place] [address]  CBC WITH DIFFERENTIAL     Status: Abnormal   Collection Time: 02/09/19 12:40 PM  Result Value Ref Range   WBC 14.2 (H) 4.0 - 10.5 K/uL   RBC 2.31 (L) 4.22 - 5.81 MIL/uL   Hemoglobin 6.9 (LL) 13.0 - 17.0 g/dL    Comment: This critical result has verified and been called to Charlton Haws. RN by Raelyn Ensign on 08 18 2020 at 1324, and has been read back. CRITICAL RESULT VERIFIED   HCT 21.9 (L) 39.0 - 52.0 %   MCV 94.8 80.0 - 100.0 fL   MCH 29.9 26.0 - 34.0 pg   MCHC 31.5 30.0 - 36.0 g/dL   RDW 13.2 11.5 - 15.5 %   Platelets  150 - 400 K/uL    PLATELET CLUMPS NOTED ON SMEAR, COUNT APPEARS ADEQUATE    Comment: REPEATED TO VERIFY PLATELET COUNT CONFIRMED BY SMEAR SPECIMEN CHECKED FOR  CLOTS    nRBC 0.8 (H) 0.0 - 0.2 %   Neutrophils Relative % 73 %   Neutro Abs 10.4 (H) 1.7 - 7.7 K/uL   Lymphocytes Relative 13 %   Lymphs Abs 1.8 0.7 - 4.0 K/uL   Monocytes Relative 9 %   Monocytes Absolute 1.2 (H) 0.1 - 1.0 K/uL   Eosinophils Relative 4 %   Eosinophils Absolute 0.6 (H) 0.0 - 0.5 K/uL   Basophils Relative 0 %   Basophils Absolute 0.0 0.0 - 0.1 K/uL   Immature Granulocytes 1 %   Abs Immature Granulocytes 0.14 (H) 0.00 - 0.07 K/uL   Polychromasia PRESENT    Ovalocytes PRESENT     Comment: Performed at [facility], [place] [address] [place], [place] [address]  Protime-INR     Status: None   Collection Time: 02/09/19 12:40 PM  Result Value Ref Range   Prothrombin Time 13.3 11.4 - 15.2 seconds   INR 1.0 0.8 - 1.2    Comment: (NOTE) INR goal varies based on device and disease states. Performed at [facility], [place] [address], [place], [place] [address]   Type and screen [place]     Status: None (Preliminary result)   Collection Time: 02/09/19 12:40 PM  Result Value Ref Range   ABO/RH(D) A POS    Antibody Screen NEG    Sample Expiration 02/12/2019,2359    Unit Number [id number]    Blood Component Type RED CELLS,LR    Unit division 00    Status of Unit ISSUED    Transfusion Status OK TO TRANSFUSE    Crossmatch Result      Compatible Performed at [facility], [place] [address], [place],  [address]   POC occult blood, ED Provider will collect     Status: Abnormal   Collection Time: 02/09/19  2:20 PM  Result Value Ref Range   Fecal Occult Bld POSITIVE (A) NEGATIVE  Prepare RBC     Status: None   Collection Time: 02/09/19  2:30 PM  Result Value Ref Range   Order Confirmation  ORDER PROCESSED BY BLOOD BANK Performed at [facility], [place] [address] [place], [place] [address]    Dg Chest 1 View  Result Date: 02/09/2019 CLINICAL DATA:  Shortness of  breath. EXAM: CHEST  1 VIEW COMPARISON:  Radiographs of February 02, 2019. FINDINGS: Stable cardiomediastinal silhouette. No pneumothorax is noted. Left lung opacities noted on prior exam are nearly resolved currently. Mild right pleural effusion is noted with associated atelectasis or infiltrate. Bony thorax is unremarkable. IMPRESSION: Mild right pleural effusion is noted with associated right basilar atelectasis or infiltrate. Electronically Signed   By: Marijo Conception M.D.   On: 02/09/2019 13:06    Pending Labs Unresulted Labs (From admission, onward)    Start     Ordered   02/10/19 0500  CBC with Differential/Platelet  Daily,   R     02/09/19 1447   02/10/19 0500  Comprehensive metabolic panel  Daily,   R     02/09/19 1447   02/10/19 0500  C-reactive protein  Daily,   R     02/09/19 1447   02/10/19 0500  D-dimer, quantitative (not at [facility])  Daily,   R     02/09/19 1447   02/10/19 0500  Ferritin  Daily,   R     02/09/19 1447   02/10/19 0500  Interleukin-6, Plasma  Daily,   R     02/09/19 1447   02/10/19 0500  Magnesium  Daily,   R     02/09/19 1447   02/09/19 1455  Triglycerides  Add-on,   AD     02/09/19 1455   02/09/19 1454  C-reactive protein  Add-on,   AD     02/09/19 1454   02/09/19 1452  D-dimer, quantitative (not at [facility])  Add-on,   AD     02/09/19 1451   02/09/19 1447  Comprehensive metabolic panel  Once,   STAT     02/09/19 1447   02/09/19 1447  Ferritin  Once,   STAT     02/09/19 1447   02/09/19 1447  Interleukin-6, Plasma  Once,   STAT     02/09/19 1447   02/09/19 1447  Sedimentation rate  Once,   STAT     02/09/19 1447   02/09/19 1447  Culture, blood (Routine X 2) w Reflex to ID Panel  BLOOD CULTURE X 2,   R (with STAT occurrences)     02/09/19 1447   02/09/19 1410  Hemoglobin and hematocrit, blood  Now then every 8 hours,   R (with STAT occurrences)     02/09/19 1409   02/09/19 1405  Iron and TIBC  Add-on,   AD     02/09/19 1404   02/09/19 1405  Vitamin B12   Add-on,   AD     02/09/19 1404   02/09/19 1405  Folate  Add-on,   AD     02/09/19 1404   02/09/19 1405  Ferritin  Add-on,   AD     02/09/19 1404   Signed and Held  HIV antibody (Routine Testing)  Once,   R     Signed and Held   Signed and Held  Comprehensive metabolic panel  Tomorrow morning,   R     Signed and Held   Signed and Held  CBC  Tomorrow morning,   R     Signed and Held          Vitals/Pain Today's Vitals   02/09/19 1400 02/09/19 1440  02/09/19 1445 02/09/19 1500  BP: 140/61 (!) 154/69 (!) 153/68 (!) 159/66  Pulse: 91 95 93 95  Resp: (!) 23 (!) 27 (!) 26 (!) 23  Temp:  98.5 F (36.9 C)  98.6 F (37 C)  TempSrc:  Oral  Oral  SpO2: 100% 100% 100% 100%  Weight:      Height:      PainSc:        Isolation Precautions Airborne and Contact precautions  Medications Medications  pantoprazole (PROTONIX) 80 mg in sodium chloride 0.9 % 250 mL (0.32 mg/mL) infusion (0 mg/hr Intravenous Hold 02/09/19 1447)  pantoprazole (PROTONIX) injection 40 mg (has no administration in time range)  vitamin C (ASCORBIC ACID) tablet 500 mg (has no administration in time range)  zinc sulfate capsule 220 mg (has no administration in time range)  guaiFENesin-dextromethorphan (ROBITUSSIN DM) 100-10 MG/5ML syrup 10 mL (has no administration in time range)  chlorpheniramine-HYDROcodone (TUSSIONEX) 10-8 MG/5ML suspension 5 mL (has no administration in time range)  dexamethasone (DECADRON) injection 6 mg (has no administration in time range)  remdesivir 200 mg in sodium chloride 0.9 % 250 mL IVPB (has no administration in time range)    Followed by  remdesivir 100 mg in sodium chloride 0.9 % 250 mL IVPB (has no administration in time range)  pantoprazole (PROTONIX) injection 40 mg (40 mg Intravenous Given 02/09/19 1404)  0.9 %  sodium chloride infusion (Manually program via Guardrails IV Fluids) (10 mL/hr Intravenous New Bag/Given 02/09/19 1403)  pantoprazole (PROTONIX) injection 40 mg (40 mg  Intravenous Given 02/09/19 1428)    Mobility walks with person assist Low fall risk   Focused Assessments Cardiac Assessment Handoff:  Cardiac Rhythm: Normal sinus rhythm Lab Results  Component Value Date   CKTOTAL 140 02/02/2019   CKMB 0.8 02/02/2019   No results found for: DDIMER Does the Patient currently have chest pain? No  , Pulmonary Assessment Handoff:  Lung sounds:   O2 Device: Nasal Cannula O2 Flow Rate (L/min): 3 L/min      R Recommendations: See Admitting Provider Note  Report given to:   Additional Notes:   PT is covid pos. Please contact wife for any relevant updates.

## 2019-02-09 NOTE — ED Notes (Signed)
Called to inquire about delay in handoff report. Unable to get in contact with charge nurse or receiving nurse. Will call again soon.

## 2019-02-09 NOTE — ED Notes (Signed)
Pt was unable to complete orthostatic VS. While sitting on the edge of the bed pt started to sway back and forth and laid back down before BP was finished. BP dropped significantly.

## 2019-02-09 NOTE — ED Notes (Signed)
Date and time results received: 02/09/19 1:25 PM  (use smartphrase ".now" to insert current time)  Test: Hgb Critical Value: 6.9  Name of Provider Notified: awaiting orders  Orders Received? Or Actions Taken?: Orders Received - See Orders for details

## 2019-02-10 DIAGNOSIS — D62 Acute posthemorrhagic anemia: Secondary | ICD-10-CM

## 2019-02-10 LAB — COMPREHENSIVE METABOLIC PANEL
ALT: 14 U/L (ref 0–44)
AST: 16 U/L (ref 15–41)
Albumin: 2.3 g/dL — ABNORMAL LOW (ref 3.5–5.0)
Alkaline Phosphatase: 54 U/L (ref 38–126)
Anion gap: 7 (ref 5–15)
BUN: 22 mg/dL (ref 8–23)
CO2: 24 mmol/L (ref 22–32)
Calcium: 8 mg/dL — ABNORMAL LOW (ref 8.9–10.3)
Chloride: 108 mmol/L (ref 98–111)
Creatinine, Ser: 0.92 mg/dL (ref 0.61–1.24)
GFR calc Af Amer: >60 mL/min (ref >60)
GFR calc non Af Amer: >60 mL/min (ref >60)
Glucose, Bld: 138 mg/dL — ABNORMAL HIGH (ref 70–99)
Potassium: 4.3 mmol/L (ref 3.5–5.1)
Sodium: 139 mmol/L (ref 135–145)
Total Bilirubin: 0.5 mg/dL (ref 0.3–1.2)
Total Protein: 5.7 g/dL — ABNORMAL LOW (ref 6.5–8.1)

## 2019-02-10 LAB — HEMOGLOBIN AND HEMATOCRIT, BLOOD
HCT: 20.6 % — ABNORMAL LOW (ref 39.0–52.0)
HCT: 23.4 % — ABNORMAL LOW (ref 39.0–52.0)
HCT: 25.7 % — ABNORMAL LOW (ref 39.0–52.0)
Hemoglobin: 6.4 g/dL — CL (ref 13.0–17.0)
Hemoglobin: 7.7 g/dL — ABNORMAL LOW (ref 13.0–17.0)
Hemoglobin: 8.2 g/dL — ABNORMAL LOW (ref 13.0–17.0)

## 2019-02-10 LAB — CBC WITH DIFFERENTIAL/PLATELET
Abs Immature Granulocytes: 0.09 10*3/uL — ABNORMAL HIGH (ref 0.00–0.07)
Basophils Absolute: 0 10*3/uL (ref 0.0–0.1)
Basophils Relative: 0 %
Eosinophils Absolute: 0.4 10*3/uL (ref 0.0–0.5)
Eosinophils Relative: 4 %
HCT: 21.2 % — ABNORMAL LOW (ref 39.0–52.0)
Hemoglobin: 6.6 g/dL — CL (ref 13.0–17.0)
Immature Granulocytes: 1 %
Lymphocytes Relative: 15 %
Lymphs Abs: 1.7 10*3/uL (ref 0.7–4.0)
MCH: 28.2 pg (ref 26.0–34.0)
MCHC: 31.1 g/dL (ref 30.0–36.0)
MCV: 90.6 fL (ref 80.0–100.0)
Monocytes Absolute: 1.2 10*3/uL — ABNORMAL HIGH (ref 0.1–1.0)
Monocytes Relative: 10 %
Neutro Abs: 8.1 10*3/uL — ABNORMAL HIGH (ref 1.7–7.7)
Neutrophils Relative %: 70 %
Platelets: 271 10*3/uL (ref 150–400)
RBC: 2.34 MIL/uL — ABNORMAL LOW (ref 4.22–5.81)
RDW: 16.3 % — ABNORMAL HIGH (ref 11.5–15.5)
WBC: 11.5 10*3/uL — ABNORMAL HIGH (ref 4.0–10.5)
nRBC: 1 % — ABNORMAL HIGH (ref 0.0–0.2)

## 2019-02-10 LAB — FERRITIN: Ferritin: 105 ng/mL (ref 24–336)

## 2019-02-10 LAB — PREPARE RBC (CROSSMATCH)

## 2019-02-10 LAB — MAGNESIUM: Magnesium: 2.2 mg/dL (ref 1.7–2.4)

## 2019-02-10 LAB — GLUCOSE, CAPILLARY
Glucose-Capillary: 109 mg/dL — ABNORMAL HIGH (ref 70–99)
Glucose-Capillary: 116 mg/dL — ABNORMAL HIGH (ref 70–99)
Glucose-Capillary: 131 mg/dL — ABNORMAL HIGH (ref 70–99)
Glucose-Capillary: 107 mg/dL — ABNORMAL HIGH (ref 70–99)

## 2019-02-10 LAB — C-REACTIVE PROTEIN: CRP: 1.2 mg/dL — ABNORMAL HIGH (ref <1.0)

## 2019-02-10 LAB — HIV ANTIBODY (ROUTINE TESTING W REFLEX): HIV Screen 4th Generation wRfx: NONREACTIVE

## 2019-02-10 LAB — D-DIMER, QUANTITATIVE: D-Dimer, Quant: 1.87 ug/mL-FEU — ABNORMAL HIGH (ref 0.00–0.50)

## 2019-02-10 MED ORDER — SODIUM CHLORIDE 0.9% IV SOLUTION: Freq: Once | INTRAVENOUS | Status: DC

## 2019-02-10 MED ORDER — SODIUM CHLORIDE 0.9% IV SOLUTION
Freq: Once | INTRAVENOUS | Status: AC
  Administered 2019-02-10: 11:00:00 via INTRAVENOUS

## 2019-02-10 NOTE — Progress Notes (Signed)
CRITICAL VALUE ALERT  Critical Value:  HGB 6.6  Date & Time Notied:  02/10/19@0331   Provider Notified: yes  Orders Received/Actions taken:

## 2019-02-10 NOTE — Progress Notes (Addendum)
[name] Kitchen  PROGRESS NOTE    Larry [name]  EML:[id number] DOB: July 14, 1950 DOA: 02/09/2019 PCP: Lawerance Cruel, MD   Brief Narrative:   Larry [name] is a 68 y.o. male with medical history significant of granulomaatous lung disease, HTN, HLD and multiple other medical problems presenting with progressive SOB and weakness.   He was seen about 1 week ago in pulmonary clinic for similar symptoms.  C/o fatigue, weight loss, and SOB.  He reported that his O2 sats were in the 90's at home and was concerned about a recurrent pleural effusion.  He notes these symptoms have continued which have led him to present today.  He notes DOE.  No CP.  He denies fevers.  He denies abdominal pain.  He notes for at least the past day or so he's noticed dark red stools.  He notes his son was COVID 19 positive 2-3 weeks ago.     Assessment & Plan:   Active Problems:   COVID-19 virus infection   Shortness of Breath, Acute Hypoxic Respiratory Failure:      - multifactorial 2/2 COVID 19 pneumonia and ABLA.       - See below  AHRF 2/2 COVID 19 Pneumonia:      - exposure to son 2-3 weeks ago.       - He was tested maybe 2 weeks ago and negative.       - Of note, he had increased SOB about 1 week ago and was seen in pulmonary clinic (notified MD about his positive status).       - He was started on O2.       - He had CXR on 8/11 concerning for multifocal opacities R>L concerning for multifocal pneumonia with small R effusion.     - Remdesivir started as suspect his hypoxia a week ago was related to COVID 19 pneumonia     - dexamethasone started     - CRP: 1.2; D-dimer 1.87     - Unable to place on DVT ppx due to ABLA and active GI bleed     - Prone as able     - I/O, daily weights  Acute Blood Loss Anemia 2/2 GI Bleed     - describes as maroon stool.  He takes ibuprofen occasionally.     - at admission, Hb down to 6.9 from 12.7 in 2012     - s/p 1 unit pRBC; Hgb still low this AM; will transfuse another 2  units (1 has already been ordered).     - Placed on PPI gtt     - Trend Hb q8 hrs     - admitting physician discussed with GI, no plans for scope given COVID 19 positive status.  If hemodynamically unstable, will need to call back.      - Consult GI; spoke with GI today; unless hemodynamically unstable, no scope; continue PPI, transfusions.  Leukocytosis:      - Mild, continue to monitor.    BPH     - flomax  HTN     - coreg  HLD     - continue lipitor  Hyperglycemia     - follow A1c; SSI.  DVT prophylaxis: SCDs Code Status: FULL   Disposition Plan: TBD  Antimicrobials:  . Remdesivir   ROS:  Denies CP, N, V. Reports dark stools, improving breathing . Remainder 10-pt ROS is negative for all not previously mentioned.  Subjective: "It's doing better."  Objective: Vitals:  02/09/19 1752 02/09/19 2052 02/10/19 0419 02/10/19 0951  BP: (!) 175/70 (!) 148/73 132/60 (!) 153/62  Pulse: 97 100 82 86  Resp: (!) 22 (!) 21 17 12   Temp: 98.4 F (36.9 C) 98.1 F (36.7 C) 98.1 F (36.7 C) 97.7 F (36.5 C)  TempSrc: Oral Oral Oral Oral  SpO2: 100% 99% 95% 100%  Weight:   84.4 kg   Height:        Intake/Output Summary (Last 24 hours) at 02/10/2019 1026 Last data filed at 02/10/2019 9675 Gross per 24 hour  Intake 1414.52 ml  Output 200 ml  Net 1214.52 ml   Filed Weights   02/09/19 1130 02/10/19 0419  Weight: 88.5 kg 84.4 kg    Examination:  General: 68 y.o. male resting in bed in NAD Eyes: PERRL, normal sclera ENMT: Nares patent w/o discharge, orophaynx clear, dentition normal, ears w/o discharge/lesions/ulcers Cardiovascular: RRR, +S1, S2, no m/g/r, equal pulses throughout Respiratory: CTABL, no w/r/r, normal WOB GI: BS+, NDNT, no masses noted, no organomegaly noted MSK: No e/c/c Skin: No rashes, bruises, ulcerations noted Neuro: alert to name, follows commands, no focal deficits   Data Reviewed: I have personally reviewed following labs and imaging  studies.  CBC: Recent Labs  Lab 02/09/19 1240 02/09/19 1901 02/09/19 2201 02/10/19 0313 02/10/19 0901  WBC 14.2*  --  9.8 11.5*  --   NEUTROABS 10.4*  --   --  8.1*  --   HGB 6.9* 4.7* 7.5* 6.6* 6.4*  HCT 21.9* 15.1* 23.8* 21.2* 20.6*  MCV 94.8  --  89.5 90.6  --   PLT PLATELET CLUMPS NOTED ON SMEAR, COUNT APPEARS ADEQUATE  --  310 271  --    Basic Metabolic Panel: Recent Labs  Lab 02/09/19 1240 02/09/19 1901 02/10/19 0313  NA 140 141 139  K 4.4 4.2 4.3  CL 107 107 108  CO2 25 21* 24  GLUCOSE 149* 109* 138*  BUN 18 17 22   CREATININE 0.87 0.80 0.92  CALCIUM 8.6* 8.4* 8.0*  MG  --   --  2.2   GFR: Estimated Creatinine Clearance: 84.3 mL/min (by C-G formula based on SCr of 0.92 mg/dL). Liver Function Tests: Recent Labs  Lab 02/09/19 1240 02/09/19 1901 02/10/19 0313  AST 19 17 16   ALT 18 15 14   ALKPHOS 66 57 54  BILITOT 0.5 0.7 0.5  PROT 6.8 5.8* 5.7*  ALBUMIN 2.7* 2.4* 2.3*   No results for input(s): LIPASE, AMYLASE in the last 168 hours. No results for input(s): AMMONIA in the last 168 hours. Coagulation Profile: Recent Labs  Lab 02/09/19 1240  INR 1.0   Cardiac Enzymes: No results for input(s): CKTOTAL, CKMB, CKMBINDEX, TROPONINI in the last 168 hours. BNP (last 3 results) No results for input(s): PROBNP in the last 8760 hours. HbA1C: Recent Labs    02/09/19 1240 02/09/19 1901  HGBA1C 5.7* 5.8*   CBG: Recent Labs  Lab 02/09/19 1124 02/09/19 2049 02/10/19 0859  GLUCAP 184* 117* 109*   Lipid Profile: Recent Labs    02/09/19 1901  TRIG 130   Thyroid Function Tests: No results for input(s): TSH, T4TOTAL, FREET4, T3FREE, THYROIDAB in the last 72 hours. Anemia Panel: Recent Labs    02/09/19 1901 02/10/19 0313  VITAMINB12 536  --   FOLATE 8.1  --   FERRITIN 118 105  TIBC 288  --   IRON 236*  --    Sepsis Labs: No results for input(s): PROCALCITON, LATICACIDVEN in the last 168 hours.  Recent Results (from the past 240 hour(s))   SARS Coronavirus 2 [facility] order, Performed in [facility] hospital lab) Nasopharyngeal Nasopharyngeal Swab     Status: Abnormal   Collection Time: 02/09/19 12:35 PM   Specimen: Nasopharyngeal Swab  Result Value Ref Range Status   SARS Coronavirus 2 POSITIVE (A) NEGATIVE Final    Comment: RESULT CALLED TO, READ BACK BY AND VERIFIED WITH: S.LEONARD AT 1431 ON 02/09/19 BY N.THOMPSON (NOTE) If result is NEGATIVE SARS-CoV-2 target nucleic acids are NOT DETECTED. The SARS-CoV-2 RNA is generally detectable in upper and lower  respiratory specimens during the acute phase of infection. The lowest  concentration of SARS-CoV-2 viral copies this assay can detect is 250  copies / mL. A negative result does not preclude SARS-CoV-2 infection  and should not be used as the sole basis for treatment or other  patient management decisions.  A negative result may occur with  improper specimen collection / handling, submission of specimen other  than nasopharyngeal swab, presence of viral mutation(s) within the  areas targeted by this assay, and inadequate number of viral copies  (<250 copies / mL). A negative result must be combined with clinical  observations, patient history, and epidemiological information. If result is POSITIVE SARS-CoV-2 target nucleic acids are DETEC TED. The SARS-CoV-2 RNA is generally detectable in upper and lower  respiratory specimens during the acute phase of infection.  Positive  results are indicative of active infection with SARS-CoV-2.  Clinical  correlation with patient history and other diagnostic information is  necessary to determine patient infection status.  Positive results do  not rule out bacterial infection or co-infection with other viruses. If result is PRESUMPTIVE POSTIVE SARS-CoV-2 nucleic acids MAY BE PRESENT.   A presumptive positive result was obtained on the submitted specimen  and confirmed on repeat testing.  While 2019 novel coronavirus   (SARS-CoV-2) nucleic acids may be present in the submitted sample  additional confirmatory testing may be necessary for epidemiological  and / or clinical management purposes  to differentiate between  SARS-CoV-2 and other Sarbecovirus currently known to infect humans.  If clinically indicated additional testing with an alternate test  methodology (LAB7 453) is advised. The SARS-CoV-2 RNA is generally  detectable in upper and lower respiratory specimens during the acute  phase of infection. The expected result is Negative. Fact Sheet for Patients:  StrictlyIdeas.no Fact Sheet for Healthcare Providers: BankingDealers.co.za This test is not yet approved or cleared by the [place] FDA and has been authorized for detection and/or diagnosis of SARS-CoV-2 by FDA under an Emergency Use Authorization (EUA).  This EUA will remain in effect (meaning this test can be used) for the duration of the COVID-19 declaration under Section 564(b)(1) of the Act, 21 U.S.C. section 360bbb-3(b)(1), unless the authorization is terminated or revoked sooner. Performed at [facility], [place] [address] [place], [place] [address]   Culture, blood (Routine X 2) w Reflex to ID Panel     Status: None (Preliminary result)   Collection Time: 02/09/19  7:01 PM   Specimen: BLOOD  Result Value Ref Range Status   Specimen Description   Final    BLOOD LEFT ANTECUBITAL Performed at [place] [address] [place], [place] [address]    Special Requests   Final    BOTTLES DRAWN AEROBIC ONLY Blood Culture adequate volume Performed at [facility] [address], [place], [place] [address]    Culture   Final  NO GROWTH < 12 HOURS Performed at [facility] [address], [place], [place] [address]    Report Status PENDING  Incomplete  Culture, blood (Routine X 2) w Reflex to ID Panel     Status:  None (Preliminary result)   Collection Time: 02/09/19  7:01 PM   Specimen: BLOOD LEFT HAND  Result Value Ref Range Status   Specimen Description   Final    BLOOD LEFT HAND Performed at [facility] [address] [place], [place] [address]    Special Requests   Final    BOTTLES DRAWN AEROBIC ONLY Blood Culture adequate volume Performed at [facility] [address], [place], [place] [address]    Culture   Final    NO GROWTH < 12 HOURS Performed at [facility] [address], [place],  [address]    Report Status PENDING  Incomplete      Radiology Studies: Dg Chest 1 View  Result Date: 02/09/2019 CLINICAL DATA:  Shortness of breath. EXAM: CHEST  1 VIEW COMPARISON:  Radiographs of February 02, 2019. FINDINGS: Stable cardiomediastinal silhouette. No pneumothorax is noted. Left lung opacities noted on prior exam are nearly resolved currently. Mild right pleural effusion is noted with associated atelectasis or infiltrate. Bony thorax is unremarkable. IMPRESSION: Mild right pleural effusion is noted with associated right basilar atelectasis or infiltrate. Electronically Signed   By: Marijo Conception M.D.   On: 02/09/2019 13:06     Scheduled Meds: . sodium chloride   Intravenous Once  . sodium chloride   Intravenous Once  . atorvastatin  40 mg Oral q1800  . carvedilol  25 mg Oral BID  . dexamethasone (DECADRON) injection  6 mg Intravenous Q24H  . insulin aspart  0-5 Units Subcutaneous QHS  . insulin aspart  0-9 Units Subcutaneous TID WC  . [START ON 02/13/2019] pantoprazole  40 mg Intravenous Q12H  . tamsulosin  0.4 mg Oral Daily  . vitamin C  500 mg Oral Daily  . zinc sulfate  220 mg Oral Daily   Continuous Infusions: . pantoprozole (PROTONIX) infusion 8 mg/hr (02/10/19 7017)  . remdesivir 100 mg in NS 250 mL       LOS: 1 day    Time spent: 25 minutes spent in the coordination of care today.   Jonnie Finner, DO [facility] Pager [phone]  If 7PM-7AM, please contact night-coverage [email or web address] Password [facility] 02/10/2019, 10:26 AM

## 2019-02-11 DIAGNOSIS — K922 Gastrointestinal hemorrhage, unspecified: Secondary | ICD-10-CM

## 2019-02-11 DIAGNOSIS — N400 Enlarged prostate without lower urinary tract symptoms: Secondary | ICD-10-CM

## 2019-02-11 DIAGNOSIS — E785 Hyperlipidemia, unspecified: Secondary | ICD-10-CM

## 2019-02-11 DIAGNOSIS — I10 Essential (primary) hypertension: Secondary | ICD-10-CM

## 2019-02-11 LAB — GLUCOSE, CAPILLARY
Glucose-Capillary: 131 mg/dL — ABNORMAL HIGH (ref 70–99)
Glucose-Capillary: 135 mg/dL — ABNORMAL HIGH (ref 70–99)
Glucose-Capillary: 131 mg/dL — ABNORMAL HIGH (ref 70–99)
Glucose-Capillary: 116 mg/dL — ABNORMAL HIGH (ref 70–99)

## 2019-02-11 LAB — COMPREHENSIVE METABOLIC PANEL
ALT: 15 U/L (ref 0–44)
AST: 20 U/L (ref 15–41)
Albumin: 2.6 g/dL — ABNORMAL LOW (ref 3.5–5.0)
Alkaline Phosphatase: 66 U/L (ref 38–126)
Anion gap: 12 (ref 5–15)
BUN: 16 mg/dL (ref 8–23)
CO2: 22 mmol/L (ref 22–32)
Calcium: 8.7 mg/dL — ABNORMAL LOW (ref 8.9–10.3)
Chloride: 104 mmol/L (ref 98–111)
Creatinine, Ser: 0.88 mg/dL (ref 0.61–1.24)
GFR calc Af Amer: >60 mL/min (ref >60)
GFR calc non Af Amer: >60 mL/min (ref >60)
Glucose, Bld: 158 mg/dL — ABNORMAL HIGH (ref 70–99)
Potassium: 4.2 mmol/L (ref 3.5–5.1)
Sodium: 138 mmol/L (ref 135–145)
Total Bilirubin: 0.5 mg/dL (ref 0.3–1.2)
Total Protein: 6.6 g/dL (ref 6.5–8.1)

## 2019-02-11 LAB — BPAM RBC
Blood Product Expiration Date: 202009142359
Blood Product Expiration Date: 202009142359
ISSUE DATE / TIME: 202008181427
ISSUE DATE / TIME: 202008190954
Unit Type and Rh: 6200
Unit Type and Rh: 6200

## 2019-02-11 LAB — HEMOGLOBIN AND HEMATOCRIT, BLOOD
HCT: 27.1 % — ABNORMAL LOW (ref 39.0–52.0)
HCT: 25 % — ABNORMAL LOW (ref 39.0–52.0)
HCT: 25.2 % — ABNORMAL LOW (ref 39.0–52.0)
HCT: 23.6 % — ABNORMAL LOW (ref 39.0–52.0)
Hemoglobin: 8.8 g/dL — ABNORMAL LOW (ref 13.0–17.0)
Hemoglobin: 8.1 g/dL — ABNORMAL LOW (ref 13.0–17.0)
Hemoglobin: 8 g/dL — ABNORMAL LOW (ref 13.0–17.0)
Hemoglobin: 7.8 g/dL — ABNORMAL LOW (ref 13.0–17.0)

## 2019-02-11 LAB — CBC WITH DIFFERENTIAL/PLATELET
Abs Immature Granulocytes: 0.17 10*3/uL — ABNORMAL HIGH (ref 0.00–0.07)
Basophils Absolute: 0 10*3/uL (ref 0.0–0.1)
Basophils Relative: 0 %
Eosinophils Absolute: 0.1 10*3/uL (ref 0.0–0.5)
Eosinophils Relative: 1 %
HCT: 27.5 % — ABNORMAL LOW (ref 39.0–52.0)
Hemoglobin: 8.9 g/dL — ABNORMAL LOW (ref 13.0–17.0)
Immature Granulocytes: 2 %
Lymphocytes Relative: 14 %
Lymphs Abs: 1.5 10*3/uL (ref 0.7–4.0)
MCH: 29.7 pg (ref 26.0–34.0)
MCHC: 32.4 g/dL (ref 30.0–36.0)
MCV: 91.7 fL (ref 80.0–100.0)
Monocytes Absolute: 0.4 10*3/uL (ref 0.1–1.0)
Monocytes Relative: 3 %
Neutro Abs: 8.8 10*3/uL — ABNORMAL HIGH (ref 1.7–7.7)
Neutrophils Relative %: 80 %
Platelets: 280 10*3/uL (ref 150–400)
RBC: 3 MIL/uL — ABNORMAL LOW (ref 4.22–5.81)
RDW: 15.8 % — ABNORMAL HIGH (ref 11.5–15.5)
WBC: 11 10*3/uL — ABNORMAL HIGH (ref 4.0–10.5)
nRBC: 0.7 % — ABNORMAL HIGH (ref 0.0–0.2)

## 2019-02-11 LAB — TYPE AND SCREEN
ABO/RH(D): A POS
Antibody Screen: NEGATIVE
Unit division: 0
Unit division: 0

## 2019-02-11 LAB — INTERLEUKIN-6, PLASMA: Interleukin-6, Plasma: 8 pg/mL (ref 0.0–12.2)

## 2019-02-11 LAB — D-DIMER, QUANTITATIVE: D-Dimer, Quant: 9.75 ug/mL-FEU — ABNORMAL HIGH (ref 0.00–0.50)

## 2019-02-11 LAB — FERRITIN: Ferritin: 95 ng/mL (ref 24–336)

## 2019-02-11 LAB — C-REACTIVE PROTEIN: CRP: 1 mg/dL — ABNORMAL HIGH (ref <1.0)

## 2019-02-11 LAB — MAGNESIUM: Magnesium: 2.2 mg/dL (ref 1.7–2.4)

## 2019-02-11 MED ORDER — ONDANSETRON HCL 4 MG/2ML IJ SOLN: 4.0000 mg | Freq: Four times a day (QID) | INTRAMUSCULAR | Status: DC | PRN

## 2019-02-11 NOTE — Progress Notes (Signed)
[name] Kitchen  PROGRESS NOTE    Larry [name]  JEH:[id number] DOB: [birth year] DOA: 02/09/2019 PCP: Lawerance Cruel, MD   Brief Narrative:   Larry [name] a 68 y.o.malewith medical history significant ofgranulomaatous lung disease, HTN, HLD and multiple other medical problems presenting with progressive SOB and weakness.   He was seen about 1 week ago in pulmonary clinic for similar symptoms. C/o fatigue, weight loss, and SOB. He reported that his O2 sats were in the 90's at home and was concerned about a recurrent pleural effusion. He notes these symptoms have continued which have led him to present today. He notes DOE. No CP. He denies fevers. He denies abdominal pain. He notes for at least the past day or so he's noticed dark red stools. He notes his son was COVID 19 positive 2-3 weeks ago.    Assessment & Plan:   Active Problems:   HTN (hypertension), benign   COVID-19 virus infection   Symptomatic anemia   GI bleed   BPH (benign prostatic hyperplasia)   HLD (hyperlipidemia)   Shortness of Breath, Acute Hypoxic Respiratory Failure:      - multifactorial 2/2 COVID 19 pneumonia and ABLA.       - See below  AHRF 2/2 COVID 19 Pneumonia:      - exposure to son 2-3 weeks ago.       - He was tested maybe 2 weeks ago and negative.       - Of note, he had increased SOB about 1 week ago and was seen in pulmonary clinic (notified MD about his positive status).       - He was started on O2.       - He had CXR on 8/11 concerning for multifocal opacities R>L concerning for multifocal pneumonia with small R effusion.     - Remdesivir started as suspect his hypoxia a week ago was related to COVID 19 pneumonia     - dexamethasone started     - CRP: 1.2; D-dimer 1.87 --> now 9.75     - Unable to place on DVT ppx due to ABLA and active GI bleed     - Prone as able     - I/O, daily weights  Acute Blood Loss Anemia 2/2 GI Bleed     - describes as maroon stool.  He takes ibuprofen  occasionally.     - at admission, Hb down to 6.9 from 12.7 in 2012     - s/p 1 unit pRBC; Hgb still low this AM; will transfuse another 2 units (1 has already been ordered).     - Placed on PPI gtt     - Trend Hb q8 hrs     - admitting physician discussed with GI, no plans for scope given COVID 19 positive status.  If hemodynamically unstable, will need to call back.      - Consult GI; spoke with GI 02/10/19; unless hemodynamically unstable, no scope; continue PPI, transfusions.     - now s/p 2 units this stay; Hgb is 8.1 this AM; denies any stools ON   Leukocytosis:      - Mild, continue to monitor.    BPH     - flomax  HTN     - coreg  HLD     - continue lipitor  Hyperglycemia     - follow A1c; SSI.  Hgb up to 8.8 ON, but now 8.1. He denies any further bloody stools this AM. Feels  like he is doing better overall. D-dimer is trending up; but other inflammatory markers are holding stable. Continue remdes/dex combo. Unable to add chemical DVT PPx d/t GIB. Holding at [facility] d/t possible need for scope. GI notified of patient. Current recs for PPI and transfusion as necessary.  DVT prophylaxis: SCDs Code Status: FULL Family Communication: Spoke with family by phone.   Disposition Plan: TBD   Antimicrobials:  . Remdesivir   ROS:  Denies CP, dyspnea, cough, N/V, dark stools. Remainder 10-pt ROS is negative for all not previously mentioned.  Subjective: "I've only passed gas last night."  Objective: Vitals:   02/10/19 1015 02/10/19 1148 02/10/19 2046 02/11/19 0658  BP: (!) 157/64 (!) 134/59 (!) 150/69 (!) 145/67  Pulse: 84 77 81 87  Resp: 14 14 18 18   Temp: 98 F (36.7 C) 98.3 F (36.8 C) 98.6 F (37 C) 98.7 F (37.1 C)  TempSrc: Oral Oral Oral Oral  SpO2: 100% 100% 96% 98%  Weight:    84.2 kg  Height:        Intake/Output Summary (Last 24 hours) at 02/11/2019 1251 Last data filed at 02/10/2019 2200 Gross per 24 hour  Intake 344.92 ml  Output 450 ml  Net  -105.08 ml   Filed Weights   02/09/19 1130 02/10/19 0419 02/11/19 0658  Weight: 88.5 kg 84.4 kg 84.2 kg    Examination:  General: 68 y.o. male resting in bed in NAD Eyes: PERRL, normal sclera ENMT: Nares patent w/o discharge, orophaynx clear, dentition normal, ears w/o discharge/lesions/ulcers Cardiovascular: RRR, +S1, S2, no m/g/r, equal pulses throughout Respiratory: CTABL, no w/r/r, normal WOB GI: BS+, NDNT, no masses noted, no organomegaly noted MSK: No e/c/c Skin: No rashes, bruises, ulcerations noted Neuro: alert to name, follows commands, no focal deficits   Data Reviewed: I have personally reviewed following labs and imaging studies.  CBC: Recent Labs  Lab 02/09/19 1240  02/09/19 2201 02/10/19 0313 02/10/19 0901 02/10/19 1419 02/10/19 2108 02/11/19 0305 02/11/19 0905  WBC 14.2*  --  9.8 11.5*  --   --   --  11.0*  --   NEUTROABS 10.4*  --   --  8.1*  --   --   --  8.8*  --   HGB 6.9*   < > 7.5* 6.6* 6.4* 7.7* 8.2* 8.9*  8.8* 8.1*  HCT 21.9*   < > 23.8* 21.2* 20.6* 23.4* 25.7* 27.5*  27.1* 25.0*  MCV 94.8  --  89.5 90.6  --   --   --  91.7  --   PLT PLATELET CLUMPS NOTED ON SMEAR, COUNT APPEARS ADEQUATE  --  310 271  --   --   --  280  --    < > = values in this interval not displayed.   Basic Metabolic Panel: Recent Labs  Lab 02/09/19 1240 02/09/19 1901 02/10/19 0313 02/11/19 0305  NA 140 141 139 138  K 4.4 4.2 4.3 4.2  CL 107 107 108 104  CO2 25 21* 24 22  GLUCOSE 149* 109* 138* 158*  BUN 18 17 22 16   CREATININE 0.87 0.80 0.92 0.88  CALCIUM 8.6* 8.4* 8.0* 8.7*  MG  --   --  2.2 2.2   GFR: Estimated Creatinine Clearance: 88.2 mL/min (by C-G formula based on SCr of 0.88 mg/dL). Liver Function Tests: Recent Labs  Lab 02/09/19 1240 02/09/19 1901 02/10/19 0313 02/11/19 0305  AST 19 17 16 20   ALT 18 15 14 15   ALKPHOS 66  57 54 66  BILITOT 0.5 0.7 0.5 0.5  PROT 6.8 5.8* 5.7* 6.6  ALBUMIN 2.7* 2.4* 2.3* 2.6*   No results for input(s):  LIPASE, AMYLASE in the last 168 hours. No results for input(s): AMMONIA in the last 168 hours. Coagulation Profile: Recent Labs  Lab 02/09/19 1240  INR 1.0   Cardiac Enzymes: No results for input(s): CKTOTAL, CKMB, CKMBINDEX, TROPONINI in the last 168 hours. BNP (last 3 results) No results for input(s): PROBNP in the last 8760 hours. HbA1C: Recent Labs    02/09/19 1240 02/09/19 1901  HGBA1C 5.7* 5.8*   CBG: Recent Labs  Lab 02/10/19 0859 02/10/19 1157 02/10/19 1655 02/10/19 2044 02/11/19 0911  GLUCAP 109* 116* 131* 107* 131*   Lipid Profile: Recent Labs    02/09/19 1901  TRIG 130   Thyroid Function Tests: No results for input(s): TSH, T4TOTAL, FREET4, T3FREE, THYROIDAB in the last 72 hours. Anemia Panel: Recent Labs    02/09/19 1901 02/10/19 0313 02/11/19 0305  VITAMINB12 536  --   --   FOLATE 8.1  --   --   FERRITIN 118 105 95  TIBC 288  --   --   IRON 236*  --   --    Sepsis Labs: No results for input(s): PROCALCITON, LATICACIDVEN in the last 168 hours.  Recent Results (from the past 240 hour(s))  SARS Coronavirus 2 [facility] order, Performed in [facility] hospital lab) Nasopharyngeal Nasopharyngeal Swab     Status: Abnormal   Collection Time: 02/09/19 12:35 PM   Specimen: Nasopharyngeal Swab  Result Value Ref Range Status   SARS Coronavirus 2 POSITIVE (A) NEGATIVE Final    Comment: RESULT CALLED TO, READ BACK BY AND VERIFIED WITH: S.LEONARD AT 1431 ON 02/09/19 BY N.THOMPSON (NOTE) If result is NEGATIVE SARS-CoV-2 target nucleic acids are NOT DETECTED. The SARS-CoV-2 RNA is generally detectable in upper and lower  respiratory specimens during the acute phase of infection. The lowest  concentration of SARS-CoV-2 viral copies this assay can detect is 250  copies / mL. A negative result does not preclude SARS-CoV-2 infection  and should not be used as the sole basis for treatment or other  patient management decisions.  A negative result may occur  with  improper specimen collection / handling, submission of specimen other  than nasopharyngeal swab, presence of viral mutation(s) within the  areas targeted by this assay, and inadequate number of viral copies  (<250 copies / mL). A negative result must be combined with clinical  observations, patient history, and epidemiological information. If result is POSITIVE SARS-CoV-2 target nucleic acids are DETEC TED. The SARS-CoV-2 RNA is generally detectable in upper and lower  respiratory specimens during the acute phase of infection.  Positive  results are indicative of active infection with SARS-CoV-2.  Clinical  correlation with patient history and other diagnostic information is  necessary to determine patient infection status.  Positive results do  not rule out bacterial infection or co-infection with other viruses. If result is PRESUMPTIVE POSTIVE SARS-CoV-2 nucleic acids MAY BE PRESENT.   A presumptive positive result was obtained on the submitted specimen  and confirmed on repeat testing.  While 2019 novel coronavirus  (SARS-CoV-2) nucleic acids may be present in the submitted sample  additional confirmatory testing may be necessary for epidemiological  and / or clinical management purposes  to differentiate between  SARS-CoV-2 and other Sarbecovirus currently known to infect humans.  If clinically indicated additional testing with an alternate test  methodology (  LAB7 453) is advised. The SARS-CoV-2 RNA is generally  detectable in upper and lower respiratory specimens during the acute  phase of infection. The expected result is Negative. Fact Sheet for Patients:  StrictlyIdeas.no Fact Sheet for Healthcare Providers: BankingDealers.co.za This test is not yet approved or cleared by the [place] FDA and has been authorized for detection and/or diagnosis of SARS-CoV-2 by FDA under an Emergency Use Authorization (EUA).  This EUA  will remain in effect (meaning this test can be used) for the duration of the COVID-19 declaration under Section 564(b)(1) of the Act, 21 U.S.C. section 360bbb-3(b)(1), unless the authorization is terminated or revoked sooner. Performed at [facility], [place] [address], [place], [place] [address]   Culture, blood (Routine X 2) w Reflex to ID Panel     Status: None (Preliminary result)   Collection Time: 02/09/19  7:01 PM   Specimen: BLOOD  Result Value Ref Range Status   Specimen Description   Final    BLOOD LEFT ANTECUBITAL Performed at [facility] [address], [place], [place] [address]    Special Requests   Final    BOTTLES DRAWN AEROBIC ONLY Blood Culture adequate volume Performed at [facility] [address] [place], [place] [address]    Culture   Final    NO GROWTH 2 DAYS Performed at [facility] [address], [place], [place] [address]    Report Status PENDING  Incomplete  Culture, blood (Routine X 2) w Reflex to ID Panel     Status: None (Preliminary result)   Collection Time: 02/09/19  7:01 PM   Specimen: BLOOD LEFT HAND  Result Value Ref Range Status   Specimen Description   Final    BLOOD LEFT HAND Performed at [facility] [address], [place], [place] [address]    Special Requests   Final    BOTTLES DRAWN AEROBIC ONLY Blood Culture adequate volume Performed at [facility] [address], [place], [place] [address]    Culture   Final    NO GROWTH 2 DAYS Performed at [facility] [address], [place], [place] [address]    Report Status PENDING  Incomplete      Radiology Studies: No results found.   Scheduled Meds: . sodium chloride   Intravenous Once  . sodium chloride   Intravenous Once  . atorvastatin  40 mg Oral q1800  . carvedilol  25 mg Oral BID  . dexamethasone (DECADRON) injection  6 mg Intravenous Q24H  . insulin  aspart  0-5 Units Subcutaneous QHS  . insulin aspart  0-9 Units Subcutaneous TID WC  . [START ON 02/13/2019] pantoprazole  40 mg Intravenous Q12H  . tamsulosin  0.4 mg Oral Daily  . vitamin C  500 mg Oral Daily  . zinc sulfate  220 mg Oral Daily   Continuous Infusions: . pantoprozole (PROTONIX) infusion 8 mg/hr (02/11/19 0655)  . remdesivir 100 mg in NS 250 mL 100 mg (02/10/19 1802)     LOS: 2 days    Time spent: 25 minutes spent in the coordination of care today.   Jonnie Finner, DO [facility] Pager [phone]  If 7PM-7AM, please contact night-coverage [email or web address] Password [email or web address] 02/11/2019, 12:51 PM

## 2019-02-11 NOTE — Plan of Care (Signed)
  Problem: Clinical Measurements: Goal: Diagnostic test results will improve Outcome: Completed/Met  Hgb increased to 8.9 after blood transfusions  patient reports no further BMs overnight

## 2019-02-11 NOTE — Consult Note (Signed)
Referring Provider: Dr. Florene Glen Primary Care Physician:  Lawerance Cruel, MD Primary Gastroenterologist:  Althia Forts  Reason for Consultation:  GI bleed  HPI: Larry [name] is a 68 y.o. male admitted for shortness of breath and weakness and found to be COVID positive seen for a GI consult due to rectal bleeding described as maroon-colored stool and found to have a Hgb 6.9 on admit. Virtual visit done with patient by phone due to COVID positive status and he reports having 2 episodes of rectal bleeding the day before admission with mainly dark red blood and a small amount of bright red blood. Denies abdominal pain, N/V, hematemesis. Felt dizzy with the shortness of breath that he denies now. Hgb reportedly dropped to 4.7 but recheck of 7.5 on 02/09/19. Transfused 1 unit on 02/09/19 and Hgb post-transfusion of 6.6. Transfused 1 additional unit yesterday and Hgb increased to 7.2 initially and then 8.9 this morning and now 8.1. Had rectal bleeding 2 nights ago while hospitalized but none reported yesterday or today. Was taking Aleve BID at home. Denies any known history of peptic ulcer disease. Discussed with nurse today and patient has been hemodynamically stable without any further GI bleeding.  Past Medical History:  Diagnosis Date  . Abdominal pain   . Chest pain   . Fever   . Generalized headaches   . GERD (gastroesophageal reflux disease)   . Granulomatous lung disease ([place]) 2012   s/p R VATS  . Hyperlipidemia   . Hypertension   . Multiple allergies   . Nasal congestion     Past Surgical History:  Procedure Laterality Date  . Drainage of pleural effusion.  11/16/2010  . ENDOBRONCHIAL ULTRASOUND  11/16/2010  . FIBEROPTIC BRONCHOSCOPY  11/16/2010   Dr Arlyce Dice, ytology from [facility] on Nov 16, 2010, right lung   . Insertion of PleurX catheter.  11/16/2010  . Pleural biopsies.  11/16/2010  . Right VATS.  11/16/2010    Prior to Admission medications   Medication Sig Start Date End Date  Taking? Authorizing Provider  atorvastatin (LIPITOR) 40 MG tablet Take 40 mg by mouth daily at 6 PM.  02/10/11  Yes [provider]  carvedilol (COREG) 25 MG tablet Take 25 mg by mouth 2 (two) times daily with a meal.  11/13/18  Yes [provider]  fluticasone (FLONASE) 50 MCG/ACT nasal spray Place 2 sprays into the nose daily as needed for allergies.    Yes [provider]  hydrocortisone 2.5 % cream Apply 1 application topically 2 (two) times daily.     Yes [provider]  ibuprofen (ADVIL) 800 MG tablet Take 800 mg by mouth every 6 (six) hours as needed for moderate pain.    Yes [provider]  tamsulosin (FLOMAX) 0.4 MG CAPS capsule Take 0.4 mg by mouth daily. 01/05/19  Yes [provider]  nebivolol (BYSTOLIC) 10 MG tablet One daily Patient not taking: Reported on 02/02/2019 12/13/10 02/09/19  Tanda Rockers, MD    Scheduled Meds: . sodium chloride   Intravenous Once  . sodium chloride   Intravenous Once  . atorvastatin  40 mg Oral q1800  . carvedilol  25 mg Oral BID  . dexamethasone (DECADRON) injection  6 mg Intravenous Q24H  . insulin aspart  0-5 Units Subcutaneous QHS  . insulin aspart  0-9 Units Subcutaneous TID WC  . [START ON 02/13/2019] pantoprazole  40 mg Intravenous Q12H  . tamsulosin  0.4 mg Oral Daily  . vitamin C  500  mg Oral Daily  . zinc sulfate  220 mg Oral Daily   Continuous Infusions: . pantoprozole (PROTONIX) infusion 8 mg/hr (02/11/19 0655)  . remdesivir 100 mg in NS 250 mL 100 mg (02/10/19 1802)   PRN Meds:.acetaminophen **OR** acetaminophen, chlorpheniramine-HYDROcodone, guaiFENesin-dextromethorphan, oxyCODONE  Allergies as of 02/09/2019  . (No Known Allergies)    Family History  Problem Relation Age of Onset  . Diabetes Mother   . Deep vein thrombosis Mother   . Allergies Son     Social History   Socioeconomic History  . Marital status: Married    Spouse name: Not on file  . Number of  children: 3  . Years of education: Not on file  . Highest education level: Not on file  Occupational History  . Occupation: Civil engineer, contracting: [employer]  . Financial resource strain: Not on file  . Food insecurity    Worry: Not on file    Inability: Not on file  . Transportation needs    Medical: Not on file    Non-medical: Not on file  Tobacco Use  . Smoking status: Never Smoker  . Smokeless tobacco: Never Used  Substance and Sexual Activity  . Alcohol use: Yes    Comment: occasional  . Drug use: No  . Sexual activity: Not on file  Lifestyle  . Physical activity    Days per week: Not on file    Minutes per session: Not on file  . Stress: Not on file  Relationships  . Social Herbalist on phone: Not on file    Gets together: Not on file    Attends religious service: Not on file    Active member of club or organization: Not on file    Attends meetings of clubs or organizations: Not on file    Relationship status: Not on file  . Intimate partner violence    Fear of current or ex partner: Not on file    Emotionally abused: Not on file    Physically abused: Not on file    Forced sexual activity: Not on file  Other Topics Concern  . Not on file  Social History Narrative  . Not on file    Review of Systems: All negative except as stated above in HPI.  Physical Exam: Vital signs: Vitals:   02/11/19 0658 02/11/19 1259  BP: (!) 145/67 127/61  Pulse: 87 77  Resp: 18 19  Temp: 98.7 F (37.1 C) 98.1 F (36.7 C)  SpO2: 98% 96%   Last BM Date: 02/10/19 Patient not examined  GI:  Lab Results: Recent Labs    02/09/19 2201 02/10/19 0313  02/10/19 2108 02/11/19 0305 02/11/19 0905  WBC 9.8 11.5*  --   --  11.0*  --   HGB 7.5* 6.6*   < > 8.2* 8.9*  8.8* 8.1*  HCT 23.8* 21.2*   < > 25.7* 27.5*  27.1* 25.0*  PLT 310 271  --   --  280  --    < > = values in this interval not displayed.   BMET Recent Labs     02/09/19 1901 02/10/19 0313 02/11/19 0305  NA 141 139 138  K 4.2 4.3 4.2  CL 107 108 104  CO2 21* 24 22  GLUCOSE 109* 138* 158*  BUN 17 22 16   CREATININE 0.80 0.92 0.88  CALCIUM 8.4* 8.0* 8.7*   LFT Recent Labs    02/11/19 0305  PROT 6.6  ALBUMIN 2.6*  AST 20  ALT 15  ALKPHOS 66  BILITOT 0.5   PT/INR Recent Labs    02/09/19 1240  LABPROT 13.3  INR 1.0     Studies/Results: No results found.  Impression/Plan:\  68 yo COVID positive patient admitted with shortness of breath and weakness who had one day of maroon-colored stools prior to admit. Symptomatic anemia that has responded to blood transfusions. No rectal bleeding in 48 hours. On Protonix drip. Discussed GI care of patient with admitting hospitalist (Dr. Florene Glen) and updated by Dr. Marylyn Ishihara yesterday. Due to positive COVID status would not recommend an endoscopic procedure unless having hemodynamic instability and would manage GI symptoms conservatively. On clear liquid diet but ok to advance as tolerated. Please call back if rebleeding occurs with worsening of hemodynamics.    LOS: 2 days   Lear Ng  02/11/2019, 3:19 PM  Questions please call [phone]

## 2019-02-12 ENCOUNTER — Inpatient Hospital Stay (HOSPITAL_COMMUNITY): Payer: Medicare Other

## 2019-02-12 ENCOUNTER — Telehealth: Payer: Self-pay | Admitting: Critical Care Medicine

## 2019-02-12 DIAGNOSIS — R7989 Other specified abnormal findings of blood chemistry: Secondary | ICD-10-CM

## 2019-02-12 LAB — CBC WITH DIFFERENTIAL/PLATELET
Abs Immature Granulocytes: 0.12 10*3/uL — ABNORMAL HIGH (ref 0.00–0.07)
Basophils Absolute: 0 10*3/uL (ref 0.0–0.1)
Basophils Relative: 0 %
Eosinophils Absolute: 0 10*3/uL (ref 0.0–0.5)
Eosinophils Relative: 0 %
HCT: 26 % — ABNORMAL LOW (ref 39.0–52.0)
Hemoglobin: 8.3 g/dL — ABNORMAL LOW (ref 13.0–17.0)
Immature Granulocytes: 1 %
Lymphocytes Relative: 10 %
Lymphs Abs: 1 10*3/uL (ref 0.7–4.0)
MCH: 29.4 pg (ref 26.0–34.0)
MCHC: 31.9 g/dL (ref 30.0–36.0)
MCV: 92.2 fL (ref 80.0–100.0)
Monocytes Absolute: 0.6 10*3/uL (ref 0.1–1.0)
Monocytes Relative: 5 %
Neutro Abs: 9.1 10*3/uL — ABNORMAL HIGH (ref 1.7–7.7)
Neutrophils Relative %: 84 %
Platelets: 252 10*3/uL (ref 150–400)
RBC: 2.82 MIL/uL — ABNORMAL LOW (ref 4.22–5.81)
RDW: 16.5 % — ABNORMAL HIGH (ref 11.5–15.5)
WBC: 10.8 10*3/uL — ABNORMAL HIGH (ref 4.0–10.5)
nRBC: 0.4 % — ABNORMAL HIGH (ref 0.0–0.2)

## 2019-02-12 LAB — COMPREHENSIVE METABOLIC PANEL
ALT: 15 U/L (ref 0–44)
AST: 21 U/L (ref 15–41)
Albumin: 2.6 g/dL — ABNORMAL LOW (ref 3.5–5.0)
Alkaline Phosphatase: 56 U/L (ref 38–126)
Anion gap: 7 (ref 5–15)
BUN: 15 mg/dL (ref 8–23)
CO2: 22 mmol/L (ref 22–32)
Calcium: 8.7 mg/dL — ABNORMAL LOW (ref 8.9–10.3)
Chloride: 108 mmol/L (ref 98–111)
Creatinine, Ser: 0.96 mg/dL (ref 0.61–1.24)
GFR calc Af Amer: >60 mL/min (ref >60)
GFR calc non Af Amer: >60 mL/min (ref >60)
Glucose, Bld: 148 mg/dL — ABNORMAL HIGH (ref 70–99)
Potassium: 4.3 mmol/L (ref 3.5–5.1)
Sodium: 137 mmol/L (ref 135–145)
Total Bilirubin: 0.5 mg/dL (ref 0.3–1.2)
Total Protein: 6.5 g/dL (ref 6.5–8.1)

## 2019-02-12 LAB — INTERLEUKIN-6, PLASMA
Interleukin-6, Plasma: 6.1 pg/mL (ref 0.0–12.2)
Interleukin-6, Plasma: 4.2 pg/mL (ref 0.0–12.2)

## 2019-02-12 LAB — C-REACTIVE PROTEIN: CRP: <0.8 mg/dL (ref <1.0)

## 2019-02-12 LAB — HEMOGLOBIN AND HEMATOCRIT, BLOOD
HCT: 24.4 % — ABNORMAL LOW (ref 39.0–52.0)
HCT: 22.4 % — ABNORMAL LOW (ref 39.0–52.0)
HCT: 22.4 % — ABNORMAL LOW (ref 39.0–52.0)
Hemoglobin: 7.7 g/dL — ABNORMAL LOW (ref 13.0–17.0)
Hemoglobin: 7.2 g/dL — ABNORMAL LOW (ref 13.0–17.0)
Hemoglobin: 7.1 g/dL — ABNORMAL LOW (ref 13.0–17.0)

## 2019-02-12 LAB — D-DIMER, QUANTITATIVE: D-Dimer, Quant: 15.61 ug/mL-FEU — ABNORMAL HIGH (ref 0.00–0.50)

## 2019-02-12 LAB — FERRITIN: Ferritin: 81 ng/mL (ref 24–336)

## 2019-02-12 LAB — GLUCOSE, CAPILLARY
Glucose-Capillary: 136 mg/dL — ABNORMAL HIGH (ref 70–99)
Glucose-Capillary: 159 mg/dL — ABNORMAL HIGH (ref 70–99)
Glucose-Capillary: 102 mg/dL — ABNORMAL HIGH (ref 70–99)
Glucose-Capillary: 115 mg/dL — ABNORMAL HIGH (ref 70–99)

## 2019-02-12 LAB — MAGNESIUM: Magnesium: 2.3 mg/dL (ref 1.7–2.4)

## 2019-02-12 NOTE — Care Management Important Message (Signed)
Important Message  Patient Details IM Letter given to Cookie McGibboney RN to present to the Patient  Name: Larry [name] MRN: [id number] Date of Birth: 06-Aug-1950   Medicare Important Message Given:  Yes     Kerin Salen 02/12/2019, 10:31 AM

## 2019-02-12 NOTE — Progress Notes (Signed)
Lower extremity venous has been completed.   Preliminary results in CV Proc.   Abram Sander 02/12/2019 2:21 PM

## 2019-02-12 NOTE — Telephone Encounter (Signed)
Spoke with wife. Patient is no longer needing oxygen. Patient still has tank from office. I told her that Damian Leavell will come and pick it up. I gave her the number to [employer] to verify when they might be by to pick up tank that was given to patient in our office.  Nothing further needed at this time.

## 2019-02-12 NOTE — Progress Notes (Addendum)
   02/12/19 [id number]  What Happened  Was fall witnessed? Yes  Who witnessed fall? Madelon Lips Rn  Patients activity before fall bathroom-assisted  Point of contact buttocks  Was patient injured? No  Follow Up  MD notified K shorr  Time MD notified [phone]  Family notified No - patient refusal  Additional tests No  Progress note created (see row info) Yes  Adult Fall Risk Assessment  Risk Factor Category (scoring not indicated) Fall has occurred during this admission (document High fall risk)  Age 68  Fall History: Fall within 6 months prior to admission 0  Elimination; Bowel and/or Urine Incontinence 0  Elimination; Bowel and/or Urine Urgency/Frequency 2  Medications: includes PCA/Opiates, Anti-convulsants, Anti-hypertensives, Diuretics, Hypnotics, Laxatives, Sedatives, and Psychotropics 3  Patient Care Equipment 1  Mobility-Assistance 2  Mobility-Gait 0  Mobility-Sensory Deficit 0  Altered awareness of immediate physical environment 0  Impulsiveness 0  Lack of understanding of one's physical/cognitive limitations 0  Total Score 9  Patient Fall Risk Level High fall risk  Adult Fall Risk Interventions  Required Bundle Interventions *See Row Information* High fall risk - low, moderate, and high requirements implemented  Additional Interventions Use of appropriate toileting equipment (bedpan, BSC, etc.);PT/OT need assessed if change in mobility from baseline;Room near nurses station  Screening for Fall Injury Risk (To be completed on HIGH fall risk patients) - Assessing Need for Low Bed  Risk For Fall Injury- Low Bed Criteria None identified - Continue screening  Screening for Fall Injury Risk (To be completed on HIGH fall risk patients who do not meet crieteria for Low Bed) - Assessing Need for Floor Mats Only  Risk For Fall Injury- Criteria for Floor Mats None identified - No additional interventions needed  Vitals  Temp 98.2 F (36.8 C)  Temp Source Oral  BP (!) 154/61  MAP  (mmHg) 85  BP Location Left Arm  BP Method Automatic  Patient Position (if appropriate) Lying  Pulse Rate 72  Resp 19  Oxygen Therapy  SpO2 95 %  O2 Device Room Air

## 2019-02-12 NOTE — Progress Notes (Signed)
Per report, patient had large dark red stool this AM. Noted in flowsheet

## 2019-02-12 NOTE — Progress Notes (Signed)
Larry [name]  PROGRESS NOTE    Larry [name]  [id number] DOB: 17-Jan-1951 DOA: 02/09/2019 PCP: Lawerance Cruel, MD   Brief Narrative:   Larry [name] a 68 y.o.malewith medical history significant ofgranulomaatous lung disease, HTN, HLD and multiple other medical problems presenting with progressive SOB and weakness.   He was seen about 1 week ago in pulmonary clinic for similar symptoms. C/o fatigue, weight loss, and SOB. He reported that his O2 sats were in the 90's at home and was concerned about a recurrent pleural effusion. He notes these symptoms have continued which have led him to present today. He notes DOE. No CP. He denies fevers. He denies abdominal pain. He notes for at least the past day or so he's noticed dark red stools. He notes his son was COVID 19 positive 2-3 weeks ago.   Assessment & Plan:   Active Problems:   HTN (hypertension), benign   COVID-19 virus infection   Symptomatic anemia   GI bleed   BPH (benign prostatic hyperplasia)   HLD (hyperlipidemia)    AHRF 2/2 COVID 19 Pneumonia:  - exposure to son 2-3 weeks ago.  - He was tested maybe 2 weeks ago and negative.  - Of note, he had increased SOB about 1 week ago and was seen in pulmonary clinic (notified MD about his positive status).  - He was started on O2.  - He had CXR on 8/11 concerning for multifocal opacities R>L concerning for multifocal pneumonia with small R effusion. - Remdesivir started as suspect his hypoxia a week ago was related to COVID 19 pneumonia - dexamethasone started - CRP: 1.2; D-dimer1.87 --> 9.75 --> 15.61 - Unable to place on DVT ppx due to ABLA and active GI bleed - Prone as able - I/O, daily weights     - on RA, sats fine; says he's breathing comfortably     - finish up remdesivir, dexa combo tomorrow     - as far as d-dimer goes; all of his other inflammatory markers have normalized; reasonable to check venous  dopplers on BLE and CTA chest; can not add anticoag at this time d/t GIB  Acute Blood Loss Anemia 2/2 GI Bleed - describes as maroon stool. He takes ibuprofen occasionally. - at admission, Hb down to 6.9 from 12.7 in 2012 - s/p 1 unit pRBC; Hgb still low this AM; will transfuse another 2 units (1 has already been ordered). - Placed on PPI gtt - Trend Hb q8 hrs - admitting physician discussed with GI, no plans for scope given COVID 19 positive status. If hemodynamically unstable, will need to call back.  - Consult GI; spoke with GI 02/10/19; unless hemodynamically unstable, no scope; continue PPI, transfusions.     - now s/p 2 units this stay; Hgb is 8.1 this AM; denies any stools ON     - Hgb is 7.7 this AM; he reports 1 large BM ON; he says it was NOT red, but a little dark; continue to monitor     - if Hgb stays stable; plan would be to continue PPI as 40mg  PO BID for 4 - 6 week with follow up with PCP/GI  Leukocytosis:  - Mild, continue to monitor     - resolved   BPH - flomax  HTN - coreg  HLD - continue lipitor  Hyperglycemia - follow A1c; SSI.  DVT prophylaxis: SCDs Code Status: FULL Disposition Plan: TBD  Consultants:   GI  Antimicrobials:  . Remdesivir   ROS:  Denies CP, dyspnea, N, V, D. Remainder 10-pt ROS is negative for all not previously mentioned.  Subjective: "Thank you for talking to my wife."  Objective: Vitals:   02/12/19 0435 02/12/19 0445 02/12/19 0652 02/12/19 0918  BP: 128/66  (!) 154/61 (!) 158/59  Pulse: 72  72 74  Resp: 18  19 16   Temp: 98.3 F (36.8 C)  98.2 F (36.8 C) 97.9 F (36.6 C)  TempSrc: Oral  Oral Oral  SpO2: 97%  95% 95%  Weight:  84 kg    Height:        Intake/Output Summary (Last 24 hours) at 02/12/2019 1332 Last data filed at 02/12/2019 1218 Gross per 24 hour  Intake 1455.03 ml  Output 1825 ml  Net -369.97 ml   Filed Weights   02/10/19 0419 02/11/19 0658  02/12/19 0445  Weight: 84.4 kg 84.2 kg 84 kg    Examination:  General:68 y.o.maleresting in bed in NAD Eyes: PERRL, normal sclera ENMT: Nares patent w/o discharge, orophaynx clear, dentition normal, ears w/o discharge/lesions/ulcers Cardiovascular: RRR, +S1, S2, no m/g/r, equal pulses throughout Respiratory: CTABL, no w/r/r, normal WOB GI: BS+, NDNT, no masses noted, no organomegaly noted MSK: No e/c/c Skin: No rashes, bruises, ulcerations noted Neuro:alert to name, follows commands, no focal deficits  Data Reviewed: I have personally reviewed following labs and imaging studies.  CBC: Recent Labs  Lab 02/09/19 1240  02/09/19 2201 02/10/19 0313  02/11/19 0305 02/11/19 0905 02/11/19 1514 02/11/19 2053 02/12/19 0344 02/12/19 0932  WBC 14.2*  --  9.8 11.5*  --  11.0*  --   --   --  10.8*  --   NEUTROABS 10.4*  --   --  8.1*  --  8.8*  --   --   --  9.1*  --   HGB 6.9*   < > 7.5* 6.6*   < > 8.9*  8.8* 8.1* 8.0* 7.8* 8.3* 7.7*  HCT 21.9*   < > 23.8* 21.2*   < > 27.5*  27.1* 25.0* 25.2* 23.6* 26.0* 24.4*  MCV 94.8  --  89.5 90.6  --  91.7  --   --   --  92.2  --   PLT PLATELET CLUMPS NOTED ON SMEAR, COUNT APPEARS ADEQUATE  --  310 271  --  280  --   --   --  252  --    < > = values in this interval not displayed.   Basic Metabolic Panel: Recent Labs  Lab 02/09/19 1240 02/09/19 1901 02/10/19 0313 02/11/19 0305 02/12/19 0344  NA 140 141 139 138 137  K 4.4 4.2 4.3 4.2 4.3  CL 107 107 108 104 108  CO2 25 21* 24 22 22   GLUCOSE 149* 109* 138* 158* 148*  BUN 18 17 22 16 15   CREATININE 0.87 0.80 0.92 0.88 0.96  CALCIUM 8.6* 8.4* 8.0* 8.7* 8.7*  MG  --   --  2.2 2.2 2.3   GFR: Estimated Creatinine Clearance: 80.8 mL/min (by C-G formula based on SCr of 0.96 mg/dL). Liver Function Tests: Recent Labs  Lab 02/09/19 1240 02/09/19 1901 02/10/19 0313 02/11/19 0305 02/12/19 0344  AST 19 17 16 20 21   ALT 18 15 14 15 15   ALKPHOS 66 57 54 66 56  BILITOT 0.5 0.7 0.5 0.5  0.5  PROT 6.8 5.8* 5.7* 6.6 6.5  ALBUMIN 2.7* 2.4* 2.3* 2.6* 2.6*   No results for input(s): LIPASE, AMYLASE in the last 168 hours. No results for input(s):  AMMONIA in the last 168 hours. Coagulation Profile: Recent Labs  Lab 02/09/19 1240  INR 1.0   Cardiac Enzymes: No results for input(s): CKTOTAL, CKMB, CKMBINDEX, TROPONINI in the last 168 hours. BNP (last 3 results) No results for input(s): PROBNP in the last 8760 hours. HbA1C: Recent Labs    02/09/19 1901  HGBA1C 5.8*   CBG: Recent Labs  Lab 02/11/19 1136 02/11/19 1719 02/11/19 2154 02/12/19 0913 02/12/19 1158  GLUCAP 135* 131* 116* 136* 159*   Lipid Profile: Recent Labs    02/09/19 1901  TRIG 130   Thyroid Function Tests: No results for input(s): TSH, T4TOTAL, FREET4, T3FREE, THYROIDAB in the last 72 hours. Anemia Panel: Recent Labs    02/09/19 1901  02/11/19 0305 02/12/19 0351  VITAMINB12 536  --   --   --   FOLATE 8.1  --   --   --   FERRITIN 118   < > 95 81  TIBC 288  --   --   --   IRON 236*  --   --   --    < > = values in this interval not displayed.   Sepsis Labs: No results for input(s): PROCALCITON, LATICACIDVEN in the last 168 hours.  Recent Results (from the past 240 hour(s))  SARS Coronavirus 2 [facility] order, Performed in [facility] hospital lab) Nasopharyngeal Nasopharyngeal Swab     Status: Abnormal   Collection Time: 02/09/19 12:35 PM   Specimen: Nasopharyngeal Swab  Result Value Ref Range Status   SARS Coronavirus 2 POSITIVE (A) NEGATIVE Final    Comment: RESULT CALLED TO, READ BACK BY AND VERIFIED WITH: S.LEONARD AT 1431 ON 02/09/19 BY N.THOMPSON (NOTE) If result is NEGATIVE SARS-CoV-2 target nucleic acids are NOT DETECTED. The SARS-CoV-2 RNA is generally detectable in upper and lower  respiratory specimens during the acute phase of infection. The lowest  concentration of SARS-CoV-2 viral copies this assay can detect is 250  copies / mL. A negative result does not preclude  SARS-CoV-2 infection  and should not be used as the sole basis for treatment or other  patient management decisions.  A negative result may occur with  improper specimen collection / handling, submission of specimen other  than nasopharyngeal swab, presence of viral mutation(s) within the  areas targeted by this assay, and inadequate number of viral copies  (<250 copies / mL). A negative result must be combined with clinical  observations, patient history, and epidemiological information. If result is POSITIVE SARS-CoV-2 target nucleic acids are DETEC TED. The SARS-CoV-2 RNA is generally detectable in upper and lower  respiratory specimens during the acute phase of infection.  Positive  results are indicative of active infection with SARS-CoV-2.  Clinical  correlation with patient history and other diagnostic information is  necessary to determine patient infection status.  Positive results do  not rule out bacterial infection or co-infection with other viruses. If result is PRESUMPTIVE POSTIVE SARS-CoV-2 nucleic acids MAY BE PRESENT.   A presumptive positive result was obtained on the submitted specimen  and confirmed on repeat testing.  While 2019 novel coronavirus  (SARS-CoV-2) nucleic acids may be present in the submitted sample  additional confirmatory testing may be necessary for epidemiological  and / or clinical management purposes  to differentiate between  SARS-CoV-2 and other Sarbecovirus currently known to infect humans.  If clinically indicated additional testing with an alternate test  methodology (LAB7 453) is advised. The SARS-CoV-2 RNA is generally  detectable in upper and  lower respiratory specimens during the acute  phase of infection. The expected result is Negative. Fact Sheet for Patients:  StrictlyIdeas.no Fact Sheet for Healthcare Providers: BankingDealers.co.za This test is not yet approved or cleared by the  [place] FDA and has been authorized for detection and/or diagnosis of SARS-CoV-2 by FDA under an Emergency Use Authorization (EUA).  This EUA will remain in effect (meaning this test can be used) for the duration of the COVID-19 declaration under Section 564(b)(1) of the Act, 21 U.S.C. section 360bbb-3(b)(1), unless the authorization is terminated or revoked sooner. Performed at [facility], [place] [address] [place], [place] [address]   Culture, blood (Routine X 2) w Reflex to ID Panel     Status: None (Preliminary result)   Collection Time: 02/09/19  7:01 PM   Specimen: BLOOD  Result Value Ref Range Status   Specimen Description   Final    BLOOD LEFT ANTECUBITAL Performed at [facility] [address], [place], [place] [address]    Special Requests   Final    BOTTLES DRAWN AEROBIC ONLY Blood Culture adequate volume Performed at [facility] [address], [place], [place] [address]    Culture   Final    NO GROWTH 3 DAYS Performed at [facility], [place] [address], [place], [place] [address]    Report Status PENDING  Incomplete  Culture, blood (Routine X 2) w Reflex to ID Panel     Status: None (Preliminary result)   Collection Time: 02/09/19  7:01 PM   Specimen: BLOOD LEFT HAND  Result Value Ref Range Status   Specimen Description   Final    BLOOD LEFT HAND Performed at [facility] [address], [place], [place] [address]    Special Requests   Final    BOTTLES DRAWN AEROBIC ONLY Blood Culture adequate volume Performed at [facility] [address] [place], [place] [address]    Culture   Final    NO GROWTH 3 DAYS Performed at [facility], [place] [address] [place],  [address]    Report Status PENDING  Incomplete      Radiology Studies: No results found.   Scheduled Meds: . sodium chloride   Intravenous Once  . sodium chloride    Intravenous Once  . atorvastatin  40 mg Oral q1800  . carvedilol  25 mg Oral BID  . dexamethasone (DECADRON) injection  6 mg Intravenous Q24H  . insulin aspart  0-5 Units Subcutaneous QHS  . insulin aspart  0-9 Units Subcutaneous TID WC  . [START ON 02/13/2019] pantoprazole  40 mg Intravenous Q12H  . tamsulosin  0.4 mg Oral Daily  . vitamin C  500 mg Oral Daily  . zinc sulfate  220 mg Oral Daily   Continuous Infusions: . pantoprozole (PROTONIX) infusion 8 mg/hr (02/12/19 1203)  . remdesivir 100 mg in NS 250 mL Stopped (02/11/19 1850)     LOS: 3 days    Time spent: 25 minutes spent in the coordination of care today.   Jonnie Finner, DO [facility] Pager [phone]  If 7PM-7AM, please contact night-coverage [email or web address] Password [email or web address] 02/12/2019, 1:32 PM

## 2019-02-12 NOTE — Progress Notes (Addendum)
Was assisting patient out of bathroom. Patient put foot on IV poll  While going through bathroom door way and tripped I assisted patient to the ground. After assessing patient he had no complaints. I assisted patient  To the bed. Vitals signs are stable and on call provider was notified. Patient has no complaints at this time. Will continue to monitor patient closely.

## 2019-02-12 NOTE — Progress Notes (Addendum)
Patient had a large Dark red stool this morning patient's vital signs are stable and he has no complaints of pain. Patient alert and oriented will continue to monitor patient.

## 2019-02-13 LAB — COMPREHENSIVE METABOLIC PANEL
ALT: 17 U/L (ref 0–44)
AST: 17 U/L (ref 15–41)
Albumin: 2.5 g/dL — ABNORMAL LOW (ref 3.5–5.0)
Alkaline Phosphatase: 58 U/L (ref 38–126)
Anion gap: 8 (ref 5–15)
BUN: 23 mg/dL (ref 8–23)
CO2: 22 mmol/L (ref 22–32)
Calcium: 8.3 mg/dL — ABNORMAL LOW (ref 8.9–10.3)
Chloride: 106 mmol/L (ref 98–111)
Creatinine, Ser: 0.88 mg/dL (ref 0.61–1.24)
GFR calc Af Amer: >60 mL/min (ref >60)
GFR calc non Af Amer: >60 mL/min (ref >60)
Glucose, Bld: 152 mg/dL — ABNORMAL HIGH (ref 70–99)
Potassium: 4.3 mmol/L (ref 3.5–5.1)
Sodium: 136 mmol/L (ref 135–145)
Total Bilirubin: 0.5 mg/dL (ref 0.3–1.2)
Total Protein: 5.8 g/dL — ABNORMAL LOW (ref 6.5–8.1)

## 2019-02-13 LAB — CBC WITH DIFFERENTIAL/PLATELET
Abs Immature Granulocytes: 0.11 10*3/uL — ABNORMAL HIGH (ref 0.00–0.07)
Basophils Absolute: 0 10*3/uL (ref 0.0–0.1)
Basophils Relative: 0 %
Eosinophils Absolute: 0 10*3/uL (ref 0.0–0.5)
Eosinophils Relative: 0 %
HCT: 22.6 % — ABNORMAL LOW (ref 39.0–52.0)
Hemoglobin: 7.1 g/dL — ABNORMAL LOW (ref 13.0–17.0)
Immature Granulocytes: 1 %
Lymphocytes Relative: 11 %
Lymphs Abs: 1.1 10*3/uL (ref 0.7–4.0)
MCH: 29.3 pg (ref 26.0–34.0)
MCHC: 31.4 g/dL (ref 30.0–36.0)
MCV: 93.4 fL (ref 80.0–100.0)
Monocytes Absolute: 0.6 10*3/uL (ref 0.1–1.0)
Monocytes Relative: 6 %
Neutro Abs: 8 10*3/uL — ABNORMAL HIGH (ref 1.7–7.7)
Neutrophils Relative %: 82 %
Platelets: 204 10*3/uL (ref 150–400)
RBC: 2.42 MIL/uL — ABNORMAL LOW (ref 4.22–5.81)
RDW: 16.9 % — ABNORMAL HIGH (ref 11.5–15.5)
WBC: 9.8 10*3/uL (ref 4.0–10.5)
nRBC: 1 % — ABNORMAL HIGH (ref 0.0–0.2)

## 2019-02-13 LAB — HEMOGLOBIN AND HEMATOCRIT, BLOOD
HCT: 22.4 % — ABNORMAL LOW (ref 39.0–52.0)
HCT: 24 % — ABNORMAL LOW (ref 39.0–52.0)
HCT: 23.9 % — ABNORMAL LOW (ref 39.0–52.0)
Hemoglobin: 7.2 g/dL — ABNORMAL LOW (ref 13.0–17.0)
Hemoglobin: 7.6 g/dL — ABNORMAL LOW (ref 13.0–17.0)
Hemoglobin: 7.7 g/dL — ABNORMAL LOW (ref 13.0–17.0)

## 2019-02-13 LAB — MAGNESIUM: Magnesium: 2.3 mg/dL (ref 1.7–2.4)

## 2019-02-13 LAB — PREPARE RBC (CROSSMATCH)

## 2019-02-13 LAB — GLUCOSE, CAPILLARY
Glucose-Capillary: 134 mg/dL — ABNORMAL HIGH (ref 70–99)
Glucose-Capillary: 135 mg/dL — ABNORMAL HIGH (ref 70–99)
Glucose-Capillary: 116 mg/dL — ABNORMAL HIGH (ref 70–99)
Glucose-Capillary: 138 mg/dL — ABNORMAL HIGH (ref 70–99)

## 2019-02-13 LAB — D-DIMER, QUANTITATIVE: D-Dimer, Quant: 10.61 ug/mL-FEU — ABNORMAL HIGH (ref 0.00–0.50)

## 2019-02-13 LAB — C-REACTIVE PROTEIN: CRP: <0.8 mg/dL (ref <1.0)

## 2019-02-13 LAB — FERRITIN: Ferritin: 67 ng/mL (ref 24–336)

## 2019-02-13 MED ORDER — SODIUM CHLORIDE 0.9% IV SOLUTION
Freq: Once | INTRAVENOUS | Status: AC
  Administered 2019-02-13: 14:00:00 via INTRAVENOUS

## 2019-02-13 NOTE — Plan of Care (Signed)
  Problem: Clinical Measurements: Goal: Cardiovascular complication will be avoided Outcome: Progressing   Problem: Elimination: Goal: Will not experience complications related to bowel motility Outcome: Progressing Goal: Will not experience complications related to urinary retention Outcome: Progressing   Problem: Pain Managment: Goal: General experience of comfort will improve Outcome: Progressing   Problem: Safety: Goal: Ability to remain free from injury will improve Outcome: Progressing   Problem: Skin Integrity: Goal: Risk for impaired skin integrity will decrease Outcome: Progressing   Problem: Education: Goal: Knowledge of risk factors and measures for prevention of condition will improve Outcome: Progressing   Problem: Coping: Goal: Psychosocial and spiritual needs will be supported Outcome: Progressing   Problem: Respiratory: Goal: Will maintain a patent airway Outcome: Progressing Goal: Complications related to the disease process, condition or treatment will be avoided or minimized Outcome: Progressing

## 2019-02-13 NOTE — Progress Notes (Signed)
Patient ID: Larry [name], male   DOB: [birth year], 68 y.o.   MRN: [id number]  No bleeding overnight per discussion with staff. Hgb 7.6 (7.1) and 1 U PRBCs ordered by primary team. Continue IV PPI Q 12 hours and at discharge would do PPI PO BID for 3 months and then QD. Avoid NSAIDs. Hemodynamics stable and no plans for EGD unless hemodynamics worsen. D/W Dr. Marylyn Ishihara.

## 2019-02-13 NOTE — Progress Notes (Addendum)
[name]  PROGRESS NOTE    Larry [name]  [id number] DOB: [birth year] DOA: 02/09/2019 PCP: Lawerance Cruel, MD   Brief Narrative:   Larry [name] a 68 y.o.malewith medical history significant ofgranulomaatous lung disease, HTN, HLD and multiple other medical problems presenting with progressive SOB and weakness.   He was seen about 1 week ago in pulmonary clinic for similar symptoms. C/o fatigue, weight loss, and SOB. He reported that his O2 sats were in the 90's at home and was concerned about a recurrent pleural effusion. He notes these symptoms have continued which have led him to present today. He notes DOE. No CP. He denies fevers. He denies abdominal pain. He notes for at least the past day or so he's noticed dark red stools. He notes his son was COVID 19 positive 2-3 weeks ago.   Assessment & Plan:   Active Problems:   HTN (hypertension), benign   COVID-19 virus infection   Symptomatic anemia   GI bleed   BPH (benign prostatic hyperplasia)   HLD (hyperlipidemia)   AHRF 2/2 COVID 19 Pneumonia:  - exposure to son 2-3 weeks ago.  - He was tested maybe 2 weeks ago and negative.  - Of note, he had increased SOB about 1 week ago and was seen in pulmonary clinic (notified MD about his positive status).  - He was started on O2.  - He had CXR on 8/11 concerning for multifocal opacities R>L concerning for multifocal pneumonia with small R effusion. - Remdesivir started as suspect his hypoxia a week ago was related to COVID 19 pneumonia - dexamethasone started - CRP: 1.2; D-dimer1.87--> 9.75 --> 15.61 - Unable to place on DVT ppx due to ABLA and active GI bleed - Prone as able - I/O, daily weights     - on RA, sats fine; says he's breathing comfortably     - finish up remdesivir, dexa combo tomorrow     - as far as d-dimer goes; all of his other inflammatory markers have normalized; reasonable to check venous dopplers  on BLE and CTA chest; can not add anticoag at this time d/t GIB  Acute Blood Loss Anemia 2/2 GI Bleed - describes as maroon stool. He takes ibuprofen occasionally. - at admission, Hb down to 6.9 from 12.7 in 2012 - s/p 1 unit pRBC; Hgb still low this AM; will transfuse another 2 units (1 has already been ordered). - Placed on PPI gtt - Trend Hb q8 hrs - admitting physician discussed with GI, no plans for scope given COVID 19 positive status. If hemodynamically unstable, will need to call back.  - Consult GI; spoke with GI8/19/20; unless hemodynamically unstable, no scope; continue PPI, transfusions. - now s/p 2 units this stay; Hgb is 8.1 this AM; denies any stools ON     - Hgb is 7.7 this AM; he reports 1 large BM ON; he says it was NOT red, but a little dark; continue to monitor     - if Hgb stays stable; plan would be to continue PPI as 40mg  PO BID for 4 - 6 week with follow up with PCP/GI     - Hgb down to 7 this AM; will transfuse 1 unit pRBCs; reports large stool last night  Leukocytosis:  - Mild, continue to monitor     - resolved   BPH - flomax  HTN - coreg  HLD - continue lipitor  Hyperglycemia - follow A1c; SSI.  More of the same today. Waiting and watching  Hgb. Significant drop ON. Finishes up remdesivir and steroids today. D-dimer finally going down. All other inflammatory markers have normalized. Spoke with wife by phone [phone].  DVT prophylaxis:SCDs Code Status:FULL Disposition Plan:TBD  Consultants:   GI  Antimicrobials:   Remdesivir   ROS:  Denies CP, dyspnea, N, V, D. Remainder 10-pt ROS is negative for all not previously mentioned.  Subjective: "I don't know why it's doing this."  Objective: Vitals:   02/12/19 0918 02/12/19 1335 02/12/19 2122 02/13/19 0657  BP: (!) 158/59 (!) 126/55 (!) 125/59 131/68  Pulse: 74 68 74 68  Resp: 16 19 16 15   Temp: 97.9 F (36.6 C) 98.2 F  (36.8 C) 98.9 F (37.2 C) 98.4 F (36.9 C)  TempSrc: Oral Oral Oral Oral  SpO2: 95% 97% 96% 98%  Weight:      Height:        Intake/Output Summary (Last 24 hours) at 02/13/2019 1008 Last data filed at 02/13/2019 [id number] Gross per 24 hour  Intake 329.75 ml  Output 1400 ml  Net -1070.25 ml   Filed Weights   02/10/19 0419 02/11/19 0658 02/12/19 0445  Weight: 84.4 kg 84.2 kg 84 kg    Examination:  General:68 y.o.maleresting in bed in NAD Eyes: PERRL, normal sclera ENMT: Nares patent w/o discharge, orophaynx clear, dentition normal, ears w/o discharge/lesions/ulcers Cardiovascular: RRR, +S1, S2, no m/g/r, equal pulses throughout Respiratory: CTABL, no w/r/r, normal WOB GI: BS+, NDNT, no masses noted, no organomegaly noted MSK: No e/c/c Skin: No rashes, bruises, ulcerations noted Neuro:alert to name, follows commands, no focal deficits   Data Reviewed: I have personally reviewed following labs and imaging studies.  CBC: Recent Labs  Lab 02/09/19 1240  02/09/19 2201 02/10/19 0313  02/11/19 0305  02/12/19 0344 02/12/19 0932 02/12/19 1514 02/12/19 2059 02/13/19 0400 02/13/19 0906  WBC 14.2*  --  9.8 11.5*  --  11.0*  --  10.8*  --   --   --  9.8  --   NEUTROABS 10.4*  --   --  8.1*  --  8.8*  --  9.1*  --   --   --  8.0*  --   HGB 6.9*   < > 7.5* 6.6*   < > 8.9*   8.8*   < > 8.3* 7.7* 7.2* 7.1* 7.1*   7.2* 7.6*  HCT 21.9*   < > 23.8* 21.2*   < > 27.5*   27.1*   < > 26.0* 24.4* 22.4* 22.4* 22.6*   22.4* 24.0*  MCV 94.8  --  89.5 90.6  --  91.7  --  92.2  --   --   --  93.4  --   PLT PLATELET CLUMPS NOTED ON SMEAR, COUNT APPEARS ADEQUATE  --  310 271  --  280  --  252  --   --   --  204  --    < > = values in this interval not displayed.   Basic Metabolic Panel: Recent Labs  Lab 02/09/19 1901 02/10/19 0313 02/11/19 0305 02/12/19 0344 02/13/19 0400  NA 141 139 138 137 136  K 4.2 4.3 4.2 4.3 4.3  CL 107 108 104 108 106  CO2 21* 24 22 22 22   GLUCOSE 109* 138*  158* 148* 152*  BUN 17 22 16 15 23   CREATININE 0.80 0.92 0.88 0.96 0.88  CALCIUM 8.4* 8.0* 8.7* 8.7* 8.3*  MG  --  2.2 2.2 2.3 2.3   GFR: Estimated Creatinine Clearance: 88.2  mL/min (by C-G formula based on SCr of 0.88 mg/dL). Liver Function Tests: Recent Labs  Lab 02/09/19 1901 02/10/19 0313 02/11/19 0305 02/12/19 0344 02/13/19 0400  AST 17 16 20 21 17   ALT 15 14 15 15 17   ALKPHOS 57 54 66 56 58  BILITOT 0.7 0.5 0.5 0.5 0.5  PROT 5.8* 5.7* 6.6 6.5 5.8*  ALBUMIN 2.4* 2.3* 2.6* 2.6* 2.5*   No results for input(s): LIPASE, AMYLASE in the last 168 hours. No results for input(s): AMMONIA in the last 168 hours. Coagulation Profile: Recent Labs  Lab 02/09/19 1240  INR 1.0   Cardiac Enzymes: No results for input(s): CKTOTAL, CKMB, CKMBINDEX, TROPONINI in the last 168 hours. BNP (last 3 results) No results for input(s): PROBNP in the last 8760 hours. HbA1C: No results for input(s): HGBA1C in the last 72 hours. CBG: Recent Labs  Lab 02/12/19 0913 02/12/19 1158 02/12/19 1630 02/12/19 2119 02/13/19 0932  GLUCAP 136* 159* 102* 115* 134*   Lipid Profile: No results for input(s): CHOL, HDL, LDLCALC, TRIG, CHOLHDL, LDLDIRECT in the last 72 hours. Thyroid Function Tests: No results for input(s): TSH, T4TOTAL, FREET4, T3FREE, THYROIDAB in the last 72 hours. Anemia Panel: Recent Labs    02/12/19 0351 02/13/19 0400  FERRITIN 81 67   Sepsis Labs: No results for input(s): PROCALCITON, LATICACIDVEN in the last 168 hours.  Recent Results (from the past 240 hour(s))  SARS Coronavirus 2 [facility] order, Performed in [facility] hospital lab) Nasopharyngeal Nasopharyngeal Swab     Status: Abnormal   Collection Time: 02/09/19 12:35 PM   Specimen: Nasopharyngeal Swab  Result Value Ref Range Status   SARS Coronavirus 2 POSITIVE (A) NEGATIVE Final    Comment: RESULT CALLED TO, READ BACK BY AND VERIFIED WITH: S.LEONARD AT 1431 ON 02/09/19 BY N.THOMPSON (NOTE) If result is  NEGATIVE SARS-CoV-2 target nucleic acids are NOT DETECTED. The SARS-CoV-2 RNA is generally detectable in upper and lower  respiratory specimens during the acute phase of infection. The lowest  concentration of SARS-CoV-2 viral copies this assay can detect is 250  copies / mL. A negative result does not preclude SARS-CoV-2 infection  and should not be used as the sole basis for treatment or other  patient management decisions.  A negative result may occur with  improper specimen collection / handling, submission of specimen other  than nasopharyngeal swab, presence of viral mutation(s) within the  areas targeted by this assay, and inadequate number of viral copies  (<250 copies / mL). A negative result must be combined with clinical  observations, patient history, and epidemiological information. If result is POSITIVE SARS-CoV-2 target nucleic acids are DETEC TED. The SARS-CoV-2 RNA is generally detectable in upper and lower  respiratory specimens during the acute phase of infection.  Positive  results are indicative of active infection with SARS-CoV-2.  Clinical  correlation with patient history and other diagnostic information is  necessary to determine patient infection status.  Positive results do  not rule out bacterial infection or co-infection with other viruses. If result is PRESUMPTIVE POSTIVE SARS-CoV-2 nucleic acids MAY BE PRESENT.   A presumptive positive result was obtained on the submitted specimen  and confirmed on repeat testing.  While 2019 novel coronavirus  (SARS-CoV-2) nucleic acids may be present in the submitted sample  additional confirmatory testing may be necessary for epidemiological  and / or clinical management purposes  to differentiate between  SARS-CoV-2 and other Sarbecovirus currently known to infect humans.  If clinically indicated additional testing  with an alternate test  methodology (LAB7 453) is advised. The SARS-CoV-2 RNA is generally  detectable  in upper and lower respiratory specimens during the acute  phase of infection. The expected result is Negative. Fact Sheet for Patients:  StrictlyIdeas.no Fact Sheet for Healthcare Providers: BankingDealers.co.za This test is not yet approved or cleared by the [place] FDA and has been authorized for detection and/or diagnosis of SARS-CoV-2 by FDA under an Emergency Use Authorization (EUA).  This EUA will remain in effect (meaning this test can be used) for the duration of the COVID-19 declaration under Section 564(b)(1) of the Act, 21 U.S.C. section 360bbb-3(b)(1), unless the authorization is terminated or revoked sooner. Performed at [facility], [place] [address], [place], [place] [address]   Culture, blood (Routine X 2) w Reflex to ID Panel     Status: None (Preliminary result)   Collection Time: 02/09/19  7:01 PM   Specimen: BLOOD  Result Value Ref Range Status   Specimen Description   Final    BLOOD LEFT ANTECUBITAL Performed at [place] [facility] [place] [address] [place], [place] [address]    Special Requests   Final    BOTTLES DRAWN AEROBIC ONLY Blood Culture adequate volume Performed at [facility] [place] [address] [place], [place] [address]    Culture   Final    NO GROWTH 4 DAYS Performed at [facility], [place] [address] [place], [place] [address]    Report Status PENDING  Incomplete  Culture, blood (Routine X 2) w Reflex to ID Panel     Status: None (Preliminary result)   Collection Time: 02/09/19  7:01 PM   Specimen: BLOOD LEFT HAND  Result Value Ref Range Status   Specimen Description   Final    BLOOD LEFT HAND Performed at [facility] [address] [place], [place] [address]    Special Requests   Final    BOTTLES DRAWN AEROBIC ONLY Blood Culture adequate volume Performed at [facility] [address]  [place], [place] [address]    Culture   Final    NO GROWTH 4 DAYS Performed at [facility], [place] [address], [place], [place] [address]    Report Status PENDING  Incomplete      Radiology Studies: Vas [place] Lower Extremity Venous (dvt)  Result Date: 02/12/2019  Lower Venous Study Indications: Elevated d-dimer.  Comparison Study: no previous Performing Technologist: Abram Sander RVS  Examination Guidelines: A complete evaluation includes B-mode imaging, spectral Doppler, color Doppler, and power Doppler as needed of all accessible portions of each vessel. Bilateral testing is considered an integral part of a complete examination. Limited examinations for reoccurring indications may be performed as noted.  +---------+---------------+---------+-----------+----------+--------------+  RIGHT     Compressibility Phasicity Spontaneity Properties Thrombus Aging  +---------+---------------+---------+-----------+----------+--------------+  CFV       Full            Yes       Yes                                    +---------+---------------+---------+-----------+----------+--------------+  SFJ       Full                                                             +---------+---------------+---------+-----------+----------+--------------+  FV Prox   Full                                                             +---------+---------------+---------+-----------+----------+--------------+  FV Mid    Partial                                                          +---------+---------------+---------+-----------+----------+--------------+  FV Distal Full                                                             +---------+---------------+---------+-----------+----------+--------------+  PFV       Full                                                             +---------+---------------+---------+-----------+----------+--------------+  POP       Full            Yes       Yes                                     +---------+---------------+---------+-----------+----------+--------------+  PTV       Full                                                             +---------+---------------+---------+-----------+----------+--------------+  PERO      Full                                                             +---------+---------------+---------+-----------+----------+--------------+   +---------+---------------+---------+-----------+----------+--------------+  LEFT      Compressibility Phasicity Spontaneity Properties Thrombus Aging  +---------+---------------+---------+-----------+----------+--------------+  CFV       Full            Yes       Yes                                    +---------+---------------+---------+-----------+----------+--------------+  SFJ       Full                                                             +---------+---------------+---------+-----------+----------+--------------+  FV Prox   Full                                                             +---------+---------------+---------+-----------+----------+--------------+  FV Mid    Full                                                             +---------+---------------+---------+-----------+----------+--------------+  FV Distal Full                                                             +---------+---------------+---------+-----------+----------+--------------+  PFV       Full                                                             +---------+---------------+---------+-----------+----------+--------------+  POP       Full            Yes       Yes                                    +---------+---------------+---------+-----------+----------+--------------+  PTV       Full                                                             +---------+---------------+---------+-----------+----------+--------------+  PERO                                                       Not visualized   +---------+---------------+---------+-----------+----------+--------------+     Summary: Right: There is no evidence of deep vein thrombosis in the lower extremity. No cystic structure found in the popliteal fossa. Left: There is no evidence of deep vein thrombosis in the lower extremity. No cystic structure found in the popliteal fossa.  *See table(s) above for measurements and observations. Electronically signed by Monica Martinez MD on 02/12/2019 at 5:01:11 PM.    Final      Scheduled Meds:  sodium chloride   Intravenous Once   sodium chloride   Intravenous Once   sodium chloride   Intravenous Once   atorvastatin  40 mg Oral q1800   carvedilol  25 mg Oral BID   dexamethasone (DECADRON) injection  6 mg Intravenous Q24H   insulin aspart  0-5 Units Subcutaneous QHS  insulin aspart  0-9 Units Subcutaneous TID WC   pantoprazole  40 mg Intravenous Q12H   tamsulosin  0.4 mg Oral Daily   vitamin C  500 mg Oral Daily   zinc sulfate  220 mg Oral Daily   Continuous Infusions:  remdesivir 100 mg in NS 250 mL 100 mg (02/12/19 1823)     LOS: 4 days    Time spent: 25 minutes spent in the coordination of care today.   Jonnie Finner, DO [facility] Pager [phone]  If 7PM-7AM, please contact night-coverage [email or web address] Password [email or web address] 02/13/2019, 10:08 AM

## 2019-02-14 LAB — COMPREHENSIVE METABOLIC PANEL
ALT: 17 U/L (ref 0–44)
AST: 15 U/L (ref 15–41)
Albumin: 2.5 g/dL — ABNORMAL LOW (ref 3.5–5.0)
Alkaline Phosphatase: 53 U/L (ref 38–126)
Anion gap: 8 (ref 5–15)
BUN: 24 mg/dL — ABNORMAL HIGH (ref 8–23)
CO2: 23 mmol/L (ref 22–32)
Calcium: 8.4 mg/dL — ABNORMAL LOW (ref 8.9–10.3)
Chloride: 106 mmol/L (ref 98–111)
Creatinine, Ser: 0.82 mg/dL (ref 0.61–1.24)
GFR calc Af Amer: >60 mL/min (ref >60)
GFR calc non Af Amer: >60 mL/min (ref >60)
Glucose, Bld: 136 mg/dL — ABNORMAL HIGH (ref 70–99)
Potassium: 4.3 mmol/L (ref 3.5–5.1)
Sodium: 137 mmol/L (ref 135–145)
Total Bilirubin: 0.4 mg/dL (ref 0.3–1.2)
Total Protein: 5.7 g/dL — ABNORMAL LOW (ref 6.5–8.1)

## 2019-02-14 LAB — CBC WITH DIFFERENTIAL/PLATELET
Abs Immature Granulocytes: 0.12 10*3/uL — ABNORMAL HIGH (ref 0.00–0.07)
Basophils Absolute: 0 10*3/uL (ref 0.0–0.1)
Basophils Relative: 0 %
Eosinophils Absolute: 0 10*3/uL (ref 0.0–0.5)
Eosinophils Relative: 0 %
HCT: 25.4 % — ABNORMAL LOW (ref 39.0–52.0)
Hemoglobin: 8.1 g/dL — ABNORMAL LOW (ref 13.0–17.0)
Immature Granulocytes: 1 %
Lymphocytes Relative: 14 %
Lymphs Abs: 1.2 10*3/uL (ref 0.7–4.0)
MCH: 28.9 pg (ref 26.0–34.0)
MCHC: 31.9 g/dL (ref 30.0–36.0)
MCV: 90.7 fL (ref 80.0–100.0)
Monocytes Absolute: 0.7 10*3/uL (ref 0.1–1.0)
Monocytes Relative: 8 %
Neutro Abs: 6.7 10*3/uL (ref 1.7–7.7)
Neutrophils Relative %: 77 %
Platelets: 233 10*3/uL (ref 150–400)
RBC: 2.8 MIL/uL — ABNORMAL LOW (ref 4.22–5.81)
RDW: 17.1 % — ABNORMAL HIGH (ref 11.5–15.5)
WBC: 8.7 10*3/uL (ref 4.0–10.5)
nRBC: 1.6 % — ABNORMAL HIGH (ref 0.0–0.2)

## 2019-02-14 LAB — CULTURE, BLOOD (ROUTINE X 2)
Culture: NO GROWTH
Culture: NO GROWTH
Special Requests: ADEQUATE
Special Requests: ADEQUATE

## 2019-02-14 LAB — C-REACTIVE PROTEIN: CRP: <0.8 mg/dL (ref <1.0)

## 2019-02-14 LAB — GLUCOSE, CAPILLARY
Glucose-Capillary: 125 mg/dL — ABNORMAL HIGH (ref 70–99)
Glucose-Capillary: 141 mg/dL — ABNORMAL HIGH (ref 70–99)
Glucose-Capillary: 98 mg/dL (ref 70–99)
Glucose-Capillary: 141 mg/dL — ABNORMAL HIGH (ref 70–99)

## 2019-02-14 LAB — D-DIMER, QUANTITATIVE: D-Dimer, Quant: 10.71 ug/mL-FEU — ABNORMAL HIGH (ref 0.00–0.50)

## 2019-02-14 LAB — MAGNESIUM: Magnesium: 2.3 mg/dL (ref 1.7–2.4)

## 2019-02-14 LAB — HEMOGLOBIN AND HEMATOCRIT, BLOOD
HCT: 25.8 % — ABNORMAL LOW (ref 39.0–52.0)
HCT: 26.5 % — ABNORMAL LOW (ref 39.0–52.0)
Hemoglobin: 8.3 g/dL — ABNORMAL LOW (ref 13.0–17.0)
Hemoglobin: 8.4 g/dL — ABNORMAL LOW (ref 13.0–17.0)

## 2019-02-14 LAB — FERRITIN: Ferritin: 51 ng/mL (ref 24–336)

## 2019-02-14 NOTE — Progress Notes (Addendum)
[name] Kitchen  PROGRESS NOTE    Linas Raterman  [id number] DOB: 09-18-1950 DOA: 02/09/2019 PCP: Lawerance Cruel, MD   Brief Narrative:   Larry [name] a 68 y.o.malewith medical history significant ofgranulomaatous lung disease, HTN, HLD and multiple other medical problems presenting with progressive SOB and weakness.   He was seen about 1 week ago in pulmonary clinic for similar symptoms. C/o fatigue, weight loss, and SOB. He reported that his O2 sats were in the 90's at home and was concerned about a recurrent pleural effusion. He notes these symptoms have continued which have led him to present today. He notes DOE. No CP. He denies fevers. He denies abdominal pain. He notes for at least the past day or so he's noticed dark red stools. He notes his son was COVID 19 positive 2-3 weeks ago.   Assessment & Plan:   Active Problems:   HTN (hypertension), benign   COVID-19 virus infection   Symptomatic anemia   GI bleed   BPH (benign prostatic hyperplasia)   HLD (hyperlipidemia)   AHRF 2/2 COVID 19 Pneumonia:  - exposure to son 2-3 weeks ago.  - He was tested maybe 2 weeks ago and negative.  - Of note, he had increased SOB about 1 week ago and was seen in pulmonary clinic (notified MD about his positive status).  - He was started on O2.  - He had CXR on 8/11 concerning for multifocal opacities R>L concerning for multifocal pneumonia with small R effusion. - Remdesivir started as suspect his hypoxia a week ago was related to COVID 19 pneumonia - dexamethasone started - CRP: 1.2; D-dimer1.87--> 9.75--> 15.61 - Unable to place on DVT ppx due to ABLA and active GI bleed - Prone as able - I/O, daily weights - on RA, sats fine; says he's breathing comfortably - as far as d-dimer goes; all of his other inflammatory markers have normalized; reasonable to check venous dopplers on BLE and CTA chest; can not add anticoag at  this time d/t GIB     - remdesivir complete; dexa for 5 more days  Acute Blood Loss Anemia 2/2 GI Bleed - describes as maroon stool. He takes ibuprofen occasionally. - at admission, Hb down to 6.9 from 12.7 in 2012 - s/p 1 unit pRBC; Hgb still low this AM; will transfuse another 2 units (1 has already been ordered). - Placed on PPI gtt - Trend Hb q8 hrs - admitting physician discussed with GI, no plans for scope given COVID 19 positive status. If hemodynamically unstable, will need to call back.  - Consult GI; spoke with GI8/19/20; unless hemodynamically unstable, no scope; continue PPI, transfusions. - now s/p 2 units this stay; Hgb is 8.1 this AM; denies any stools ON - Hgb is 7.7 this AM; he reports 1 large BM ON; he says it was NOT red, but a little dark; continue to monitor - if Hgb stays stable; plan would be to continue PPI as 40mg  PO BID for 3 months with follow up with PCP/GI     - Hgb down to 7 this AM (02/13/19); will transfuse 1 unit pRBCs; reports large stool last night     - still with dark stools ON; got 1 u pRBCs on 8/22 and Hgb is 8.3 this AM; continuing BID PPI; decrease Hgb check to BID     - PCP does not have Hgb on him since 2017; baseline unknown  Leukocytosis:  - Mild, continue to monitor - resolved  BPH - flomax  HTN - coreg  HLD - continue lipitor  Hyperglycemia - follow A1c; SSI.  Still having dark stools per his report. Transfused 1u RBCs yesterday and Hgb is 8.3 this AM. Completed remdesivir; still on dexamethasone (5 doses to go). Continue watching Hgb.   DVT prophylaxis:SCDs Code Status:FULL Disposition Plan:TBD  Consultants:  GI  Antimicrobials:  Remdesivir  ROS: Denies CP, dyspnea, N, V, D. Reports darks tols. Remainder 10-pt ROS is negative for all not previously mentioned.   Subjective: "I don't want to have to come back."  Objective: Vitals:    02/13/19 1739 02/13/19 2012 02/14/19 0648 02/14/19 1153  BP: 135/62 128/60 140/63 121/67  Pulse: 67 72 65 73  Resp: (!) 22 (!) 24 20 20   Temp: 98.3 F (36.8 C) 98.4 F (36.9 C) 98.5 F (36.9 C) 98 F (36.7 C)  TempSrc: Oral Oral Oral Oral  SpO2: 100% 97% 99% 99%  Weight:      Height:        Intake/Output Summary (Last 24 hours) at 02/14/2019 1325 Last data filed at 02/14/2019 [id number] Gross per 24 hour  Intake 832 ml  Output 1775 ml  Net -943 ml   Filed Weights   02/10/19 0419 02/11/19 0658 02/12/19 0445  Weight: 84.4 kg 84.2 kg 84 kg    Examination:  General:68 y.o.maleresting in bed in NAD Eyes: PERRL, normal sclera ENMT: Nares patent w/o discharge, orophaynx clear, dentition normal, ears w/o discharge/lesions/ulcers Cardiovascular: RRR, +S1, S2, no m/g/r, equal pulses throughout Respiratory: CTABL, no w/r/r, normal WOB GI: BS+, NDNT, no masses noted, no organomegaly noted MSK: No e/c/c Skin: No rashes, bruises, ulcerations noted Neuro:alert to name, follows commands, no focal deficits   Data Reviewed: I have personally reviewed following labs and imaging studies.  CBC: Recent Labs  Lab 02/10/19 0313  02/11/19 0305  02/12/19 0344  02/13/19 0400 02/13/19 0906 02/13/19 2143 02/14/19 0522 02/14/19 0907  WBC 11.5*  --  11.0*  --  10.8*  --  9.8  --   --  8.7  --   NEUTROABS 8.1*  --  8.8*  --  9.1*  --  8.0*  --   --  6.7  --   HGB 6.6*   < > 8.9*   8.8*   < > 8.3*   < > 7.1*   7.2* 7.6* 7.7* 8.1*   8.3* 8.4*  HCT 21.2*   < > 27.5*   27.1*   < > 26.0*   < > 22.6*   22.4* 24.0* 23.9* 25.4*   25.8* 26.5*  MCV 90.6  --  91.7  --  92.2  --  93.4  --   --  90.7  --   PLT 271  --  280  --  252  --  204  --   --  233  --    < > = values in this interval not displayed.   Basic Metabolic Panel: Recent Labs  Lab 02/10/19 0313 02/11/19 0305 02/12/19 0344 02/13/19 0400 02/14/19 0522  NA 139 138 137 136 137  K 4.3 4.2 4.3 4.3 4.3  CL 108 104 108 106 106  CO2 24  22 22 22 23   GLUCOSE 138* 158* 148* 152* 136*  BUN 22 16 15 23  24*  CREATININE 0.92 0.88 0.96 0.88 0.82  CALCIUM 8.0* 8.7* 8.7* 8.3* 8.4*  MG 2.2 2.2 2.3 2.3 2.3   GFR: Estimated Creatinine Clearance: 94.6 mL/min (by C-G formula based on SCr of 0.82 mg/dL).  Liver Function Tests: Recent Labs  Lab 02/10/19 0313 02/11/19 0305 02/12/19 0344 02/13/19 0400 02/14/19 0522  AST 16 20 21 17 15   ALT 14 15 15 17 17   ALKPHOS 54 66 56 58 53  BILITOT 0.5 0.5 0.5 0.5 0.4  PROT 5.7* 6.6 6.5 5.8* 5.7*  ALBUMIN 2.3* 2.6* 2.6* 2.5* 2.5*   No results for input(s): LIPASE, AMYLASE in the last 168 hours. No results for input(s): AMMONIA in the last 168 hours. Coagulation Profile: Recent Labs  Lab 02/09/19 1240  INR 1.0   Cardiac Enzymes: No results for input(s): CKTOTAL, CKMB, CKMBINDEX, TROPONINI in the last 168 hours. BNP (last 3 results) No results for input(s): PROBNP in the last 8760 hours. HbA1C: No results for input(s): HGBA1C in the last 72 hours. CBG: Recent Labs  Lab 02/13/19 1241 02/13/19 1706 02/13/19 2022 02/14/19 0851 02/14/19 1151  GLUCAP 135* 116* 138* 125* 141*   Lipid Profile: No results for input(s): CHOL, HDL, LDLCALC, TRIG, CHOLHDL, LDLDIRECT in the last 72 hours. Thyroid Function Tests: No results for input(s): TSH, T4TOTAL, FREET4, T3FREE, THYROIDAB in the last 72 hours. Anemia Panel: Recent Labs    02/13/19 0400 02/14/19 0522  FERRITIN 67 51   Sepsis Labs: No results for input(s): PROCALCITON, LATICACIDVEN in the last 168 hours.  Recent Results (from the past 240 hour(s))  SARS Coronavirus 2 [facility] order, Performed in [facility] hospital lab) Nasopharyngeal Nasopharyngeal Swab     Status: Abnormal   Collection Time: 02/09/19 12:35 PM   Specimen: Nasopharyngeal Swab  Result Value Ref Range Status   SARS Coronavirus 2 POSITIVE (A) NEGATIVE Final    Comment: RESULT CALLED TO, READ BACK BY AND VERIFIED WITH: S.LEONARD AT 1431 ON 02/09/19 BY  N.[email or web address] (NOTE) If result is NEGATIVE SARS-CoV-2 target nucleic acids are NOT DETECTED. The SARS-CoV-2 RNA is generally detectable in upper and lower  respiratory specimens during the acute phase of infection. The lowest  concentration of SARS-CoV-2 viral copies this assay can detect is 250  copies / mL. A negative result does not preclude SARS-CoV-2 infection  and should not be used as the sole basis for treatment or other  patient management decisions.  A negative result may occur with  improper specimen collection / handling, submission of specimen other  than nasopharyngeal swab, presence of viral mutation(s) within the  areas targeted by this assay, and inadequate number of viral copies  (<250 copies / mL). A negative result must be combined with clinical  observations, patient history, and epidemiological information. If result is POSITIVE SARS-CoV-2 target nucleic acids are DETEC TED. The SARS-CoV-2 RNA is generally detectable in upper and lower  respiratory specimens during the acute phase of infection.  Positive  results are indicative of active infection with SARS-CoV-2.  Clinical  correlation with patient history and other diagnostic information is  necessary to determine patient infection status.  Positive results do  not rule out bacterial infection or co-infection with other viruses. If result is PRESUMPTIVE POSTIVE SARS-CoV-2 nucleic acids MAY BE PRESENT.   A presumptive positive result was obtained on the submitted specimen  and confirmed on repeat testing.  While 2019 novel coronavirus  (SARS-CoV-2) nucleic acids may be present in the submitted sample  additional confirmatory testing may be necessary for epidemiological  and / or clinical management purposes  to differentiate between  SARS-CoV-2 and other Sarbecovirus currently known to infect humans.  If clinically indicated additional testing with an alternate test  methodology (LAB7 [id number]) is advised.  The  SARS-CoV-2 RNA is generally  detectable in upper and lower respiratory specimens during the acute  phase of infection. The expected result is Negative. Fact Sheet for Patients:  StrictlyIdeas.no Fact Sheet for Healthcare Providers: BankingDealers.co.za This test is not yet approved or cleared by the [place] FDA and has been authorized for detection and/or diagnosis of SARS-CoV-2 by FDA under an Emergency Use Authorization (EUA).  This EUA will remain in effect (meaning this test can be used) for the duration of the COVID-19 declaration under Section 564(b)(1) of the Act, 21 U.S.C. section 360bbb-3(b)(1), unless the authorization is terminated or revoked sooner. Performed at [facility], [place] [address] [place], [place] [address]   Culture, blood (Routine X 2) w Reflex to ID Panel     Status: None   Collection Time: 02/09/19  7:01 PM   Specimen: BLOOD  Result Value Ref Range Status   Specimen Description   Final    BLOOD LEFT ANTECUBITAL Performed at [place] [address] [place], [place] [address]    Special Requests   Final    BOTTLES DRAWN AEROBIC ONLY Blood Culture adequate volume Performed at [place] [address] [place], [place] [address]    Culture   Final    NO GROWTH 5 DAYS Performed at [facility], [place] [address], [place], [place] [address]    Report Status 02/14/2019 FINAL  Final  Culture, blood (Routine X 2) w Reflex to ID Panel     Status: None   Collection Time: 02/09/19  7:01 PM   Specimen: BLOOD LEFT HAND  Result Value Ref Range Status   Specimen Description   Final    BLOOD LEFT HAND Performed at [place] [address], [place], [place] [address]    Special Requests   Final    BOTTLES DRAWN AEROBIC ONLY Blood Culture adequate volume Performed at [facility] [address]  [place], [place] [address]    Culture   Final    NO GROWTH 5 DAYS Performed at [facility], [place] [address], [place], [place] [address]    Report Status 02/14/2019 FINAL  Final      Radiology Studies: Vas [place] Lower Extremity Venous (dvt)  Result Date: 02/12/2019  Lower Venous Study Indications: Elevated d-dimer.  Comparison Study: no previous Performing Technologist: Abram Sander RVS  Examination Guidelines: A complete evaluation includes B-mode imaging, spectral Doppler, color Doppler, and power Doppler as needed of all accessible portions of each vessel. Bilateral testing is considered an integral part of a complete examination. Limited examinations for reoccurring indications may be performed as noted.  +---------+---------------+---------+-----------+----------+--------------+  RIGHT     Compressibility Phasicity Spontaneity Properties Thrombus Aging  +---------+---------------+---------+-----------+----------+--------------+  CFV       Full            Yes       Yes                                    +---------+---------------+---------+-----------+----------+--------------+  SFJ       Full                                                             +---------+---------------+---------+-----------+----------+--------------+  FV Prox   Full                                                             +---------+---------------+---------+-----------+----------+--------------+  FV Mid    Partial                                                          +---------+---------------+---------+-----------+----------+--------------+  FV Distal Full                                                             +---------+---------------+---------+-----------+----------+--------------+  PFV       Full                                                             +---------+---------------+---------+-----------+----------+--------------+  POP       Full            Yes       Yes                                     +---------+---------------+---------+-----------+----------+--------------+  PTV       Full                                                             +---------+---------------+---------+-----------+----------+--------------+  PERO      Full                                                             +---------+---------------+---------+-----------+----------+--------------+   +---------+---------------+---------+-----------+----------+--------------+  LEFT      Compressibility Phasicity Spontaneity Properties Thrombus Aging  +---------+---------------+---------+-----------+----------+--------------+  CFV       Full            Yes       Yes                                    +---------+---------------+---------+-----------+----------+--------------+  SFJ       Full                                                             +---------+---------------+---------+-----------+----------+--------------+  FV Prox   Full                                                             +---------+---------------+---------+-----------+----------+--------------+  FV Mid    Full                                                             +---------+---------------+---------+-----------+----------+--------------+  FV Distal Full                                                             +---------+---------------+---------+-----------+----------+--------------+  PFV       Full                                                             +---------+---------------+---------+-----------+----------+--------------+  POP       Full            Yes       Yes                                    +---------+---------------+---------+-----------+----------+--------------+  PTV       Full                                                             +---------+---------------+---------+-----------+----------+--------------+  PERO                                                       Not visualized   +---------+---------------+---------+-----------+----------+--------------+     Summary: Right: There is no evidence of deep vein thrombosis in the lower extremity. No cystic structure found in the popliteal fossa. Left: There is no evidence of deep vein thrombosis in the lower extremity. No cystic structure found in the popliteal fossa.  *See table(s) above for measurements and observations. Electronically signed by Monica Martinez MD on 02/12/2019 at 5:01:11 PM.    Final      Scheduled Meds:  sodium chloride   Intravenous Once   sodium chloride   Intravenous Once   atorvastatin  40 mg Oral q1800   carvedilol  25 mg Oral BID   dexamethasone (DECADRON) injection  6 mg Intravenous Q24H   insulin aspart  0-5 Units Subcutaneous QHS   insulin aspart  0-9 Units Subcutaneous TID  WC   pantoprazole  40 mg Intravenous Q12H   tamsulosin  0.4 mg Oral Daily   vitamin C  500 mg Oral Daily   zinc sulfate  220 mg Oral Daily   Continuous Infusions:   LOS: 5 days    Time spent: 25 minutes spent in the coordination of care today.   Jonnie Finner, DO [facility] Pager [phone]  If 7PM-7AM, please contact night-coverage [email or web address] Password [email or web address] 02/14/2019, 1:25 PM

## 2019-02-15 LAB — BPAM RBC
Blood Product Expiration Date: 202009182359
ISSUE DATE / TIME: 202008221438
Unit Type and Rh: 6200

## 2019-02-15 LAB — MYOSITIS ASSESSR
EJ Autoabs: NOT DETECTED
Ku Autoabs: NOT DETECTED
Mi-2 Autoabs: NOT DETECTED
OJ Autoabs: NOT DETECTED
PL-12 Autoabs: NOT DETECTED
PL-7 Autoabs: NOT DETECTED
SRP Autoabs: NOT DETECTED

## 2019-02-15 LAB — TYPE AND SCREEN
ABO/RH(D): A POS
Antibody Screen: NEGATIVE
Unit division: 0

## 2019-02-15 LAB — CBC WITH DIFFERENTIAL/PLATELET
Abs Immature Granulocytes: 0.11 10*3/uL — ABNORMAL HIGH (ref 0.00–0.07)
Basophils Absolute: 0 10*3/uL (ref 0.0–0.1)
Basophils Relative: 0 %
Eosinophils Absolute: 0 10*3/uL (ref 0.0–0.5)
Eosinophils Relative: 0 %
HCT: 26.5 % — ABNORMAL LOW (ref 39.0–52.0)
Hemoglobin: 8.4 g/dL — ABNORMAL LOW (ref 13.0–17.0)
Immature Granulocytes: 1 %
Lymphocytes Relative: 13 %
Lymphs Abs: 1.2 10*3/uL (ref 0.7–4.0)
MCH: 28.8 pg (ref 26.0–34.0)
MCHC: 31.7 g/dL (ref 30.0–36.0)
MCV: 90.8 fL (ref 80.0–100.0)
Monocytes Absolute: 0.6 10*3/uL (ref 0.1–1.0)
Monocytes Relative: 7 %
Neutro Abs: 6.8 10*3/uL (ref 1.7–7.7)
Neutrophils Relative %: 79 %
Platelets: 238 10*3/uL (ref 150–400)
RBC: 2.92 MIL/uL — ABNORMAL LOW (ref 4.22–5.81)
RDW: 17.1 % — ABNORMAL HIGH (ref 11.5–15.5)
WBC: 8.7 10*3/uL (ref 4.0–10.5)
nRBC: 2.3 % — ABNORMAL HIGH (ref 0.0–0.2)

## 2019-02-15 LAB — CK TOTAL AND CKMB (NOT AT ARMC)
CK, MB: 0.8 ng/mL (ref 0–5.0)
Relative Index: 0.6 (ref 0–4.0)
Total CK: 140 U/L (ref 44–196)

## 2019-02-15 LAB — CYCLIC CITRUL PEPTIDE ANTIBODY, IGG: Cyclic Citrullin Peptide Ab: <16 UNITS

## 2019-02-15 LAB — RENAL FUNCTION PANEL
Albumin: 2.6 g/dL — ABNORMAL LOW (ref 3.5–5.0)
Anion gap: 12 (ref 5–15)
BUN: 24 mg/dL — ABNORMAL HIGH (ref 8–23)
CO2: 19 mmol/L — ABNORMAL LOW (ref 22–32)
Calcium: 8.6 mg/dL — ABNORMAL LOW (ref 8.9–10.3)
Chloride: 104 mmol/L (ref 98–111)
Creatinine, Ser: 0.82 mg/dL (ref 0.61–1.24)
GFR calc Af Amer: >60 mL/min (ref >60)
GFR calc non Af Amer: >60 mL/min (ref >60)
Glucose, Bld: 152 mg/dL — ABNORMAL HIGH (ref 70–99)
Phosphorus: 4.5 mg/dL (ref 2.5–4.6)
Potassium: 4.1 mmol/L (ref 3.5–5.1)
Sodium: 135 mmol/L (ref 135–145)

## 2019-02-15 LAB — TEST AUTHORIZATION

## 2019-02-15 LAB — SJOGREN'S SYNDROME ANTIBODS(SSA + SSB)
SSA (Ro) (ENA) Antibody, IgG: <1 AI
SSB (La) (ENA) Antibody, IgG: <1 AI

## 2019-02-15 LAB — ANTI-SCLERODERMA ANTIBODY: Scleroderma (Scl-70) (ENA) Antibody, IgG: <1 AI

## 2019-02-15 LAB — GLUCOSE, CAPILLARY
Glucose-Capillary: 126 mg/dL — ABNORMAL HIGH (ref 70–99)
Glucose-Capillary: 121 mg/dL — ABNORMAL HIGH (ref 70–99)
Glucose-Capillary: 112 mg/dL — ABNORMAL HIGH (ref 70–99)
Glucose-Capillary: 128 mg/dL — ABNORMAL HIGH (ref 70–99)

## 2019-02-15 LAB — TEST AUTHORIZATION 2

## 2019-02-15 LAB — INTERLEUKIN-6, PLASMA
Interleukin-6, Plasma: 4 pg/mL (ref 0.0–12.2)
Interleukin-6, Plasma: 2.7 pg/mL (ref 0.0–12.2)
Interleukin-6, Plasma: 1.8 pg/mL (ref 0.0–12.2)

## 2019-02-15 LAB — PROTEINASE-3 AUTO ABS: Serine Protease 3: <1 AI

## 2019-02-15 LAB — ANCA SCREEN W REFLEX TITER
ANCA Screen: POSITIVE — AB
C-ANCA Titer: 1:40 {titer} — ABNORMAL HIGH

## 2019-02-15 LAB — MAGNESIUM: Magnesium: 2.3 mg/dL (ref 1.7–2.4)

## 2019-02-15 LAB — MYELOPEROXIDASE AUTO ABS: Myeloperoxidase Abs: <1 AI

## 2019-02-15 LAB — ANTI-DNA ANTIBODY, DOUBLE-STRANDED: ds DNA Ab: <1 IU/mL

## 2019-02-15 LAB — EXTRA LAV TOP TUBE

## 2019-02-15 LAB — ALDOLASE: Aldolase: 8 U/L (ref <=8.1)

## 2019-02-15 LAB — RHEUMATOID FACTOR: Rheumatoid fact SerPl-aCnc: <14 IU/mL (ref <14)

## 2019-02-15 NOTE — Progress Notes (Signed)
[name] Kitchen  PROGRESS NOTE    Heinz Fago  [id number] DOB: Jul 04, 1950 DOA: 02/09/2019 PCP: Lawerance Cruel, MD   Brief Narrative:   Larry [name] a 68 y.o.malewith medical history significant ofgranulomaatous lung disease, HTN, HLD and multiple other medical problems presenting with progressive SOB and weakness.   He was seen about 1 week ago in pulmonary clinic for similar symptoms. C/o fatigue, weight loss, and SOB. He reported that his O2 sats were in the 90's at home and was concerned about a recurrent pleural effusion. He notes these symptoms have continued which have led him to present today. He notes DOE. No CP. He denies fevers. He denies abdominal pain. He notes for at least the past day or so he's noticed dark red stools. He notes his son was COVID 19 positive 2-3 weeks ago.  02/15/2019: Patient seen.  Patient has no new complaints.  Hemoglobin is 8.4 g/dL today.  Patient is on IV Decadron for COVID-19, and today is a 7 out of 10.  Patient reports intermittent fever, no chills.  Patient has nonproductive cough.  No bowel movement in 2 days.   Assessment & Plan:   Active Problems:   HTN (hypertension), benign   COVID-19 virus infection   Symptomatic anemia   GI bleed   BPH (benign prostatic hyperplasia)   HLD (hyperlipidemia)   AHRF 2/2 COVID 19 Pneumonia:  - exposure to son 2-3 weeks ago.  - He was tested maybe 2 weeks ago and negative.  - Of note, he had increased SOB about 1 week ago and was seen in pulmonary clinic (notified MD about his positive status).  - He was started on O2.  - He had CXR on 8/11 concerning for multifocal opacities R>L concerning for multifocal pneumonia with small R effusion. - Remdesivir started as suspect his hypoxia a week ago was related to COVID 19 02/15/2019: Respiratory symptoms have resolved significantly.  Patient has already attended.  Complete course of IV Decadron.  pneumonia: -  dexamethasone started - CRP: 1.2; D-dimer1.87--> 9.75--> 15.61 - Unable to place on DVT ppx due to ABLA and active GI bleed - Prone as able - I/O, daily weights - on RA, sats fine; says he's breathing comfortably - as far as d-dimer goes; all of his other inflammatory markers have normalized; reasonable to check venous dopplers on BLE and CTA chest; can not add anticoag at this time d/t GIB 02/15/2019   - remdesivir complete; dexa for 3 more days after today.  Last CRP was less than 0.8.  Acute Blood Loss Anemia 2/2 GI Bleed - describes as maroon stool. He takes ibuprofen occasionally. - at admission, Hb down to 6.9 from 12.7 in 2012 - s/p 1 unit pRBC; Hgb still low this AM; will transfuse another 2 units (1 has already been ordered). - Placed on PPI gtt - Trend Hb q8 hrs - admitting physician discussed with GI, no plans for scope given COVID 19 positive status. If hemodynamically unstable, will need to call back.  - Consult GI; spoke with GI8/19/20; unless hemodynamically unstable, no scope; continue PPI, transfusions. - now s/p 2 units this stay; Hgb is 8.1 this AM; denies any stools ON - Hgb is 7.7 this AM; he reports 1 large BM ON; he says it was NOT red, but a little dark; continue to monitor - if Hgb stays stable; plan would be to continue PPI as 40mg  PO BID for 3 months with follow up with PCP/GI     -  Hgb down to 7 this AM (02/13/19); will transfuse 1 unit pRBCs; reports large stool last night     - still with dark stools ON; got 1 u pRBCs on 8/22 and Hgb is 8.3 this AM; continuing BID PPI; decrease Hgb check to BID     - PCP does not have Hgb on him since 2017; baseline unknown 02/15/2019: No bowel movement in 2 days.  Hemoglobin today is 8.4 g/dL.  Continue to monitor hemoglobin.  Leukocytosis:  - Mild, continue to monitor - resolved  BPH - flomax  HTN - coreg  HLD - continue  lipitor  Hyperglycemia: - follow A1c; SSI.  DVT prophylaxis:SCDs Code Status:FULL Disposition Plan:TBD  Consultants:  GI  Antimicrobials:  Remdesivir  Subjective: No bowel movement in 2 days. Intermittent fever.  Objective: Vitals:   02/14/19 1153 02/14/19 2203 02/15/19 0536 02/15/19 1239  BP: 121/67 (!) 146/73 139/65 131/65  Pulse: 73 73 66 71  Resp: 20 18 18 18   Temp: 98 F (36.7 C) 98.3 F (36.8 C) 98 F (36.7 C) 98.2 F (36.8 C)  TempSrc: Oral Oral Oral Oral  SpO2: 99% 98% 100% 99%  Weight:   79.6 kg   Height:        Intake/Output Summary (Last 24 hours) at 02/15/2019 1448 Last data filed at 02/15/2019 1247 Gross per 24 hour  Intake 600 ml  Output 1775 ml  Net -1175 ml   Filed Weights   02/11/19 0658 02/12/19 0445 02/15/19 0536  Weight: 84.2 kg 84 kg 79.6 kg    Examination:  General:68 y.o.maleresting in bed in NAD Eyes: PERRL, normal sclera ENMT: Nares patent w/o discharge, orophaynx clear, dentition normal, ears w/o discharge/lesions/ulcers Cardiovascular: RRR, +S1, S2, no m/g/r, equal pulses throughout Respiratory: CTABL, no w/r/r, normal WOB GI: BS+, NDNT, no masses noted, no organomegaly noted MSK: No e/c/c Skin: No rashes, bruises, ulcerations noted Neuro:alert to name, follows commands, no focal deficits   Data Reviewed: I have personally reviewed following labs and imaging studies.  CBC: Recent Labs  Lab 02/11/19 0305  02/12/19 0344  02/13/19 0400 02/13/19 0906 02/13/19 2143 02/14/19 0522 02/14/19 0907 02/15/19 0409  WBC 11.0*  --  10.8*  --  9.8  --   --  8.7  --  8.7  NEUTROABS 8.8*  --  9.1*  --  8.0*  --   --  6.7  --  6.8  HGB 8.9*  8.8*   < > 8.3*   < > 7.1*  7.2* 7.6* 7.7* 8.1*  8.3* 8.4* 8.4*  HCT 27.5*  27.1*   < > 26.0*   < > 22.6*  22.4* 24.0* 23.9* 25.4*  25.8* 26.5* 26.5*  MCV 91.7  --  92.2  --  93.4  --   --  90.7  --  90.8  PLT 280  --  252  --  204  --   --  233  --  238   < > = values  in this interval not displayed.   Basic Metabolic Panel: Recent Labs  Lab 02/11/19 0305 02/12/19 0344 02/13/19 0400 02/14/19 0522 02/15/19 0409  NA 138 137 136 137 135  K 4.2 4.3 4.3 4.3 4.1  CL 104 108 106 106 104  CO2 22 22 22 23  19*  GLUCOSE 158* 148* 152* 136* 152*  BUN 16 15 23  24* 24*  CREATININE 0.88 0.96 0.88 0.82 0.82  CALCIUM 8.7* 8.7* 8.3* 8.4* 8.6*  MG 2.2 2.3 2.3 2.3 2.3  PHOS  --   --   --   --  4.5   GFR: Estimated Creatinine Clearance: 94.6 mL/min (by C-G formula based on SCr of 0.82 mg/dL). Liver Function Tests: Recent Labs  Lab 02/10/19 0313 02/11/19 0305 02/12/19 0344 02/13/19 0400 02/14/19 0522 02/15/19 0409  AST 16 20 21 17 15   --   ALT 14 15 15 17 17   --   ALKPHOS 54 66 56 58 53  --   BILITOT 0.5 0.5 0.5 0.5 0.4  --   PROT 5.7* 6.6 6.5 5.8* 5.7*  --   ALBUMIN 2.3* 2.6* 2.6* 2.5* 2.5* 2.6*   No results for input(s): LIPASE, AMYLASE in the last 168 hours. No results for input(s): AMMONIA in the last 168 hours. Coagulation Profile: Recent Labs  Lab 02/09/19 1240  INR 1.0   Cardiac Enzymes: No results for input(s): CKTOTAL, CKMB, CKMBINDEX, TROPONINI in the last 168 hours. BNP (last 3 results) No results for input(s): PROBNP in the last 8760 hours. HbA1C: No results for input(s): HGBA1C in the last 72 hours. CBG: Recent Labs  Lab 02/14/19 1151 02/14/19 1653 02/14/19 2200 02/15/19 0758 02/15/19 1240  GLUCAP 141* 98 141* 126* 121*   Lipid Profile: No results for input(s): CHOL, HDL, LDLCALC, TRIG, CHOLHDL, LDLDIRECT in the last 72 hours. Thyroid Function Tests: No results for input(s): TSH, T4TOTAL, FREET4, T3FREE, THYROIDAB in the last 72 hours. Anemia Panel: Recent Labs    02/13/19 0400 02/14/19 0522  FERRITIN 67 51   Sepsis Labs: No results for input(s): PROCALCITON, LATICACIDVEN in the last 168 hours.  Recent Results (from the past 240 hour(s))  SARS Coronavirus 2 [facility] order, Performed in [facility] hospital lab)  Nasopharyngeal Nasopharyngeal Swab     Status: Abnormal   Collection Time: 02/09/19 12:35 PM   Specimen: Nasopharyngeal Swab  Result Value Ref [place] Status   SARS Coronavirus 2 POSITIVE (A) NEGATIVE Final    Comment: RESULT CALLED TO, READ BACK BY AND VERIFIED WITH: S.LEONARD AT 1431 ON 02/09/19 BY N.THOMPSON (NOTE) If result is NEGATIVE SARS-CoV-2 target nucleic acids are NOT DETECTED. The SARS-CoV-2 RNA is generally detectable in upper and lower  respiratory specimens during the acute phase of infection. The lowest  concentration of SARS-CoV-2 viral copies this assay can detect is 250  copies / mL. A negative result does not preclude SARS-CoV-2 infection  and should not be used as the sole basis for treatment or other  patient management decisions.  A negative result may occur with  improper specimen collection / handling, submission of specimen other  than nasopharyngeal swab, presence of viral mutation(s) within the  areas targeted by this assay, and inadequate number of viral copies  (<250 copies / mL). A negative result must be combined with clinical  observations, patient history, and epidemiological information. If result is POSITIVE SARS-CoV-2 target nucleic acids are DETEC TED. The SARS-CoV-2 RNA is generally detectable in upper and lower  respiratory specimens during the acute phase of infection.  Positive  results are indicative of active infection with SARS-CoV-2.  Clinical  correlation with patient history and other diagnostic information is  necessary to determine patient infection status.  Positive results do  not rule out bacterial infection or co-infection with other viruses. If result is PRESUMPTIVE POSTIVE SARS-CoV-2 nucleic acids MAY BE PRESENT.   A presumptive positive result was obtained on the submitted specimen  and confirmed on repeat testing.  While 2019 novel coronavirus  (SARS-CoV-2) nucleic acids may be present in  the submitted sample  additional  confirmatory testing may be necessary for epidemiological  and / or clinical management purposes  to differentiate between  SARS-CoV-2 and other Sarbecovirus currently known to infect humans.  If clinically indicated additional testing with an alternate test  methodology (LAB7 453) is advised. The SARS-CoV-2 RNA is generally  detectable in upper and lower respiratory specimens during the acute  phase of infection. The expected result is Negative. Fact Sheet for Patients:  StrictlyIdeas.no Fact Sheet for Healthcare Providers: BankingDealers.co.za This test is not yet approved or cleared by the [place] FDA and has been authorized for detection and/or diagnosis of SARS-CoV-2 by FDA under an Emergency Use Authorization (EUA).  This EUA will remain in effect (meaning this test can be used) for the duration of the COVID-19 declaration under Section 564(b)(1) of the Act, 21 U.S.C. section 360bbb-3(b)(1), unless the authorization is terminated or revoked sooner. Performed at [facility], [place] [address] [place], [place] [address]   Culture, blood (Routine X 2) w Reflex to ID Panel     Status: None   Collection Time: 02/09/19  7:01 PM   Specimen: BLOOD  Result Value Ref [place] Status   Specimen Description   Final    BLOOD LEFT ANTECUBITAL Performed at [place] [address], [place], [place] [address]    Special Requests   Final    BOTTLES DRAWN AEROBIC ONLY Blood Culture adequate volume Performed at [place] [address] [place], [place] [address]    Culture   Final    NO GROWTH 5 DAYS Performed at [facility], [place] [address], [place], [place] [address]    Report Status 02/14/2019 FINAL  Final  Culture, blood (Routine X 2) w Reflex to ID Panel     Status: None   Collection Time: 02/09/19  7:01 PM   Specimen: BLOOD LEFT HAND  Result Value Ref [place] Status    Specimen Description   Final    BLOOD LEFT HAND Performed at [place] [address] [place], [place] [address]    Special Requests   Final    BOTTLES DRAWN AEROBIC ONLY Blood Culture adequate volume Performed at [place] [address], [place], [place] [address]    Culture   Final    NO GROWTH 5 DAYS Performed at [facility], [place] [address], [place],  [address]    Report Status 02/14/2019 FINAL  Final      Radiology Studies: No results found.   Scheduled Meds: . sodium chloride   Intravenous Once  . sodium chloride   Intravenous Once  . atorvastatin  40 mg Oral q1800  . carvedilol  25 mg Oral BID  . dexamethasone (DECADRON) injection  6 mg Intravenous Q24H  . insulin aspart  0-5 Units Subcutaneous QHS  . insulin aspart  0-9 Units Subcutaneous TID WC  . pantoprazole  40 mg Intravenous Q12H  . tamsulosin  0.4 mg Oral Daily  . vitamin C  500 mg Oral Daily  . zinc sulfate  220 mg Oral Daily   Continuous Infusions:   LOS: 6 days    Time spent: 25 minutes    Bonnell Public, MD  [facility] Pager [phone]   If 7PM-7AM, please contact night-coverage [email or web address] Password [facility] 02/15/2019, 2:48 PM

## 2019-02-16 ENCOUNTER — Ambulatory Visit: Payer: Medicare Other

## 2019-02-16 ENCOUNTER — Telehealth: Payer: Self-pay | Admitting: Critical Care Medicine

## 2019-02-16 DIAGNOSIS — J849 Interstitial pulmonary disease, unspecified: Secondary | ICD-10-CM

## 2019-02-16 LAB — GLUCOSE, CAPILLARY
Glucose-Capillary: 117 mg/dL — ABNORMAL HIGH (ref 70–99)
Glucose-Capillary: 127 mg/dL — ABNORMAL HIGH (ref 70–99)
Glucose-Capillary: 134 mg/dL — ABNORMAL HIGH (ref 70–99)
Glucose-Capillary: 135 mg/dL — ABNORMAL HIGH (ref 70–99)
Glucose-Capillary: 142 mg/dL — ABNORMAL HIGH (ref 70–99)

## 2019-02-16 MED ORDER — BISACODYL 5 MG PO TBEC
5.0000 mg | DELAYED_RELEASE_TABLET | Freq: Every day | ORAL | Status: DC | PRN
  Administered 2019-02-16: 5 mg via ORAL
  Filled 2019-02-16: qty 1

## 2019-02-16 MED ORDER — PANTOPRAZOLE SODIUM 40 MG PO TBEC
40.0000 mg | DELAYED_RELEASE_TABLET | Freq: Two times a day (BID) | ORAL | Status: DC
  Administered 2019-02-16 – 2019-02-18 (×4): 40 mg via ORAL
  Filled 2019-02-16 (×4): qty 1

## 2019-02-16 MED ORDER — POLYETHYLENE GLYCOL 3350 17 G PO PACK
17.0000 g | PACK | Freq: Every day | ORAL | Status: DC
  Administered 2019-02-16 – 2019-02-18 (×3): 17 g via ORAL
  Filled 2019-02-16 (×4): qty 1

## 2019-02-16 MED ORDER — DEXAMETHASONE 6 MG PO TABS
6.0000 mg | ORAL_TABLET | Freq: Every day | ORAL | Status: DC
  Administered 2019-02-16 – 2019-02-17 (×2): 6 mg via ORAL
  Filled 2019-02-16 (×3): qty 1

## 2019-02-16 NOTE — Progress Notes (Signed)
[facility] PROGRESS NOTE  Larry [name] [id number] DOB: 10-03-1950 DOA: 02/09/2019 PCP: Lawerance Cruel, MD  Brief summary   68 y.o.malewith medical history significant ofgranulomaatous lung disease, HTN, HLD and multiple other medical problems presenting with progressive SOB and weakness.   He was seen about 1 week ago in pulmonary clinic for similar symptoms. C/o fatigue, weight loss, and SOB. He reported that his O2 sats were in the 90's at home and was concerned about a recurrent pleural effusion. He notes these symptoms have continued which have led him to present today. He notes DOE. No CP. He denies fevers. He denies abdominal pain. He notes for at least the past day or so he's noticed dark red stools. He notes his son was COVID 19 positive 2-3 weeks ago.  Assessment/Plan:  COVID 19 Pneumonia: exposure to son 2-3 weeks ago. He was tested maybe 2 weeks ago and negative.  - Of note, he had increased SOB about 1 week ago and was seen in pulmonary clinic (notified MD about his positive status).  - He was started on O2.  - He had CXR on 8/11 concerning for multifocal opacities R>L concerning for multifocal pneumonia with small R effusion. - completed remdesivir (8/19-8/21). dexa (8/19>). Will transition to PO dexa on 8/25. Can complete PO regimen at home  -weaned from oxygen.   Acute Blood Loss Anemia 2/2 GI Bleed - describes as maroon stool. He takes ibuprofen occasionally. - at admission, Hb down to 6.9 from 12.7 in 2012 - s/p 1 unit pRBC; then another 2 units  - Placed on PPI gtt. Transition to PO - hospitalist spoke with GI8/19/20; unless hemodynamically unstable, no scope; continue PPI, transfusions. - (02/13/19); transfused 1 unit pRBCs; reports large stool last night     - no new episodes of bleeding. Hg at 8.4. Will recheck Hg AM.   BPH flomax  HTN coreg  HLD continue lipitor  Hyperglycemia:  ha1c-5.8  Dispo: transition decadron, PPI to PO. recheck Hg AM  Code Status: full Family Communication: d/w patient, his family. RN (indicate person spoken with, relationship, and if by phone, the number) Disposition Plan: home, possible tomorrow    Consultants:  non  Procedures:  none  Antibiotics: Anti-infectives (From admission, onward)   Start     Dose/Rate Route Frequency Ordered Stop   02/10/19 1700  remdesivir 100 mg in sodium chloride 0.9 % 250 mL IVPB     100 mg 500 mL/hr over 30 Minutes Intravenous Every 24 hours 02/09/19 1541 02/14/19 0700   02/09/19 1700  remdesivir 200 mg in sodium chloride 0.9 % 250 mL IVPB     200 mg 500 mL/hr over 30 Minutes Intravenous Once 02/09/19 1541 02/09/19 2136        (indicate start date, and stop date if known)  HPI/Subjective: No distress. Reports mild constipation. No acute SOB. No cough   Objective: Vitals:   02/15/19 2208 02/16/19 0434  BP: 128/68 135/67  Pulse: 68 69  Resp: 16 18  Temp: 98.3 F (36.8 C) 97.7 F (36.5 C)  SpO2: 100% 99%    Intake/Output Summary (Last 24 hours) at 02/16/2019 1219 Last data filed at 02/16/2019 0953 Gross per 24 hour  Intake 240 ml  Output 950 ml  Net -710 ml   Filed Weights   02/12/19 0445 02/15/19 0536 02/16/19 0457  Weight: 84 kg 79.6 kg 78.4 kg    Exam:   General:  No distress   Cardiovascular: s1,s2 rrr  Respiratory: CTA  BL  Abdomen: soft, nt   Musculoskeletal: no leg edema    Data Reviewed: Basic Metabolic Panel: Recent Labs  Lab 02/11/19 0305 02/12/19 0344 02/13/19 0400 02/14/19 0522 02/15/19 0409  NA 138 137 136 137 135  K 4.2 4.3 4.3 4.3 4.1  CL 104 108 106 106 104  CO2 22 22 22 23  19*  GLUCOSE 158* 148* 152* 136* 152*  BUN 16 15 23  24* 24*  CREATININE 0.88 0.96 0.88 0.82 0.82  CALCIUM 8.7* 8.7* 8.3* 8.4* 8.6*  MG 2.2 2.3 2.3 2.3 2.3  PHOS  --   --   --   --  4.5   Liver Function Tests: Recent Labs  Lab 02/10/19 0313 02/11/19 0305  02/12/19 0344 02/13/19 0400 02/14/19 0522 02/15/19 0409  AST 16 20 21 17 15   --   ALT 14 15 15 17 17   --   ALKPHOS 54 66 56 58 53  --   BILITOT 0.5 0.5 0.5 0.5 0.4  --   PROT 5.7* 6.6 6.5 5.8* 5.7*  --   ALBUMIN 2.3* 2.6* 2.6* 2.5* 2.5* 2.6*   No results for input(s): LIPASE, AMYLASE in the last 168 hours. No results for input(s): AMMONIA in the last 168 hours. CBC: Recent Labs  Lab 02/11/19 0305  02/12/19 0344  02/13/19 0400 02/13/19 0906 02/13/19 2143 02/14/19 0522 02/14/19 0907 02/15/19 0409  WBC 11.0*  --  10.8*  --  9.8  --   --  8.7  --  8.7  NEUTROABS 8.8*  --  9.1*  --  8.0*  --   --  6.7  --  6.8  HGB 8.9*  8.8*   < > 8.3*   < > 7.1*  7.2* 7.6* 7.7* 8.1*  8.3* 8.4* 8.4*  HCT 27.5*  27.1*   < > 26.0*   < > 22.6*  22.4* 24.0* 23.9* 25.4*  25.8* 26.5* 26.5*  MCV 91.7  --  92.2  --  93.4  --   --  90.7  --  90.8  PLT 280  --  252  --  204  --   --  233  --  238   < > = values in this interval not displayed.   Cardiac Enzymes: No results for input(s): CKTOTAL, CKMB, CKMBINDEX, TROPONINI in the last 168 hours. BNP (last 3 results) No results for input(s): BNP in the last 8760 hours.  ProBNP (last 3 results) No results for input(s): PROBNP in the last 8760 hours.  CBG: Recent Labs  Lab 02/15/19 0758 02/15/19 1240 02/15/19 1715 02/15/19 2205 02/16/19 0913  GLUCAP 126* 121* 112* 128* 142*    Recent Results (from the past 240 hour(s))  SARS Coronavirus 2 [facility] order, Performed in [facility] hospital lab) Nasopharyngeal Nasopharyngeal Swab     Status: Abnormal   Collection Time: 02/09/19 12:35 PM   Specimen: Nasopharyngeal Swab  Result Value Ref Range Status   SARS Coronavirus 2 POSITIVE (A) NEGATIVE Final    Comment: RESULT CALLED TO, READ BACK BY AND VERIFIED WITH: S.LEONARD AT 1431 ON 02/09/19 BY N.THOMPSON (NOTE) If result is NEGATIVE SARS-CoV-2 target nucleic acids are NOT DETECTED. The SARS-CoV-2 RNA is generally detectable in upper and  lower  respiratory specimens during the acute phase of infection. The lowest  concentration of SARS-CoV-2 viral copies this assay can detect is 250  copies / mL. A negative result does not preclude SARS-CoV-2 infection  and should not be used as the sole basis for  treatment or other  patient management decisions.  A negative result may occur with  improper specimen collection / handling, submission of specimen other  than nasopharyngeal swab, presence of viral mutation(s) within the  areas targeted by this assay, and inadequate number of viral copies  (<250 copies / mL). A negative result must be combined with clinical  observations, patient history, and epidemiological information. If result is POSITIVE SARS-CoV-2 target nucleic acids are DETEC TED. The SARS-CoV-2 RNA is generally detectable in upper and lower  respiratory specimens during the acute phase of infection.  Positive  results are indicative of active infection with SARS-CoV-2.  Clinical  correlation with patient history and other diagnostic information is  necessary to determine patient infection status.  Positive results do  not rule out bacterial infection or co-infection with other viruses. If result is PRESUMPTIVE POSTIVE SARS-CoV-2 nucleic acids MAY BE PRESENT.   A presumptive positive result was obtained on the submitted specimen  and confirmed on repeat testing.  While 2019 novel coronavirus  (SARS-CoV-2) nucleic acids may be present in the submitted sample  additional confirmatory testing may be necessary for epidemiological  and / or clinical management purposes  to differentiate between  SARS-CoV-2 and other Sarbecovirus currently known to infect humans.  If clinically indicated additional testing with an alternate test  methodology (LAB7 453) is advised. The SARS-CoV-2 RNA is generally  detectable in upper and lower respiratory specimens during the acute  phase of infection. The expected result is  Negative. Fact Sheet for Patients:  StrictlyIdeas.no Fact Sheet for Healthcare Providers: BankingDealers.co.za This test is not yet approved or cleared by the [place] FDA and has been authorized for detection and/or diagnosis of SARS-CoV-2 by FDA under an Emergency Use Authorization (EUA).  This EUA will remain in effect (meaning this test can be used) for the duration of the COVID-19 declaration under Section 564(b)(1) of the Act, 21 U.S.C. section 360bbb-3(b)(1), unless the authorization is terminated or revoked sooner. Performed at [facility], [place] [address], [place], [place] [address]   Culture, blood (Routine X 2) w Reflex to ID Panel     Status: None   Collection Time: 02/09/19  7:01 PM   Specimen: BLOOD  Result Value Ref Range Status   Specimen Description   Final    BLOOD LEFT ANTECUBITAL Performed at [place] [address] [place], [place] [address]    Special Requests   Final    BOTTLES DRAWN AEROBIC ONLY Blood Culture adequate volume Performed at [place] [address], [place], [place] [address]    Culture   Final    NO GROWTH 5 DAYS Performed at [facility], [place] [address] [place], [place] [address]    Report Status 02/14/2019 FINAL  Final  Culture, blood (Routine X 2) w Reflex to ID Panel     Status: None   Collection Time: 02/09/19  7:01 PM   Specimen: BLOOD LEFT HAND  Result Value Ref Range Status   Specimen Description   Final    BLOOD LEFT HAND Performed at [place] [address], [place], [place] [address]    Special Requests   Final    BOTTLES DRAWN AEROBIC ONLY Blood Culture adequate volume Performed at [place] [address], [place], [place] [address]    Culture   Final    NO GROWTH 5 DAYS Performed at [facility], [place]  [address], [place], [place] [address]     Report Status 02/14/2019 FINAL  Final     Studies: No results found.  Scheduled Meds: . sodium chloride   Intravenous Once  . sodium chloride   Intravenous Once  . atorvastatin  40 mg Oral q1800  . carvedilol  25 mg Oral BID  . dexamethasone (DECADRON) injection  6 mg Intravenous Q24H  . insulin aspart  0-5 Units Subcutaneous QHS  . insulin aspart  0-9 Units Subcutaneous TID WC  . pantoprazole  40 mg Intravenous Q12H  . tamsulosin  0.4 mg Oral Daily  . vitamin C  500 mg Oral Daily  . zinc sulfate  220 mg Oral Daily   Continuous Infusions:  Active Problems:   HTN (hypertension), benign   COVID-19 virus infection   Symptomatic anemia   GI bleed   BPH (benign prostatic hyperplasia)   HLD (hyperlipidemia)    Time spent: >25 minutes     Kinnie Feil  [facility] Pager [phone]. If 7PM-7AM, please contact night-coverage at [email or web address], password [facility] 02/16/2019, 12:19 PM  LOS: 7 days

## 2019-02-16 NOTE — Telephone Encounter (Signed)
Ok order placed to discontinue oxygen. I called his wife but there was no answer. LM to call back.

## 2019-02-16 NOTE — Plan of Care (Addendum)
Pt did not require pain meds this shift. He did not require O2 this shift and there were no C/O SOB.

## 2019-02-16 NOTE — Telephone Encounter (Signed)
Spoke with Willeen Niece, she states the pt is still in the hospital but the doctors told her that he didn't need the oxygen anymore. He has the oxygen tanks at home now from the 02/02/2019 visit with Dr. Carlis Abbott. She would like for [place] to send an order to [employer] to discontinue oxygen. I am guessing that pt would have to come in for hospital follow up and we can determine if he still needs oxygen. Dr. Carlis Abbott, please advise. It looks like the patient was in the hospital for the last 7 days due to Covid infection.[name] Kitchen

## 2019-02-17 DIAGNOSIS — D649 Anemia, unspecified: Secondary | ICD-10-CM

## 2019-02-17 LAB — GLUCOSE, CAPILLARY
Glucose-Capillary: 134 mg/dL — ABNORMAL HIGH (ref 70–99)
Glucose-Capillary: 147 mg/dL — ABNORMAL HIGH (ref 70–99)
Glucose-Capillary: 103 mg/dL — ABNORMAL HIGH (ref 70–99)
Glucose-Capillary: 101 mg/dL — ABNORMAL HIGH (ref 70–99)

## 2019-02-17 LAB — CBC
HCT: 28.5 % — ABNORMAL LOW (ref 39.0–52.0)
Hemoglobin: 9.2 g/dL — ABNORMAL LOW (ref 13.0–17.0)
MCH: 28.8 pg (ref 26.0–34.0)
MCHC: 32.3 g/dL (ref 30.0–36.0)
MCV: 89.3 fL (ref 80.0–100.0)
Platelets: 235 10*3/uL (ref 150–400)
RBC: 3.19 MIL/uL — ABNORMAL LOW (ref 4.22–5.81)
RDW: 16.6 % — ABNORMAL HIGH (ref 11.5–15.5)
WBC: 9.6 10*3/uL (ref 4.0–10.5)
nRBC: 0.9 % — ABNORMAL HIGH (ref 0.0–0.2)

## 2019-02-17 MED ORDER — SENNOSIDES-DOCUSATE SODIUM 8.6-50 MG PO TABS
1.0000 | ORAL_TABLET | Freq: Two times a day (BID) | ORAL | Status: DC
  Administered 2019-02-17 – 2019-02-18 (×3): 1 via ORAL
  Filled 2019-02-17 (×3): qty 1

## 2019-02-17 MED ORDER — ADULT MULTIVITAMIN W/MINERALS CH
1.0000 | ORAL_TABLET | Freq: Every day | ORAL | Status: DC
  Administered 2019-02-18: 1 via ORAL
  Filled 2019-02-17: qty 1

## 2019-02-17 NOTE — Telephone Encounter (Signed)
Called and spoke to patient's wife. Let her know we will place that order to discontinue the oxygen per request and that patient should follow up as planned with Dr. Carlis Abbott. Nothing further needed at this time.

## 2019-02-17 NOTE — Progress Notes (Signed)
Initial Nutrition Assessment  RD working remotely.   DOCUMENTATION CODES:   Not applicable  INTERVENTION:  - will order Magic Cup BID with meals, each supplement provides 290 kcal and 9 grams of protein. - will order Hormel Shake once/day, each supplement provides 500 kcal and 22 grams protein.  - will order daily multivitamin with minerals. - continue to encourage PO intakes.    NUTRITION DIAGNOSIS:   Increased nutrient needs related to acute illness(COVID-19) as evidenced by estimated needs.  GOAL:   Patient will meet greater than or equal to 90% of their needs  MONITOR:   PO intake, Supplement acceptance, Labs, Weight trends  REASON FOR ASSESSMENT:   Malnutrition Screening Tool  ASSESSMENT:   68 y.o. male with medical history significant of granulomatous lung disease, HTN, HLD, and multiple other medical problems. He presented to the ED on 8/18 d/t SOB, fatigue, weakness, and weight loss. He was seen in the pulmonary clinic one week earlier d/t similar symptoms. His son was COVID-19 positive 2-3 weeks prior to patient's admission.  Limited recent PO intake documentation; most recently, patient consumed 50% of breakfast and dinner on 8/23 and 50% of lunch on 8/25. Per chart review, weight this AM was 174 lb and weight on 8/19 was 186 lb. Weight on 8/11 was 197 lb. Prior to that date, no weight hx since 2013.   Per notes: - COVID-19 positive - acute symptomatic anemia 2/2 GIB - hyperglycemia--HgbA1c: 5.8% - plan for d/c to home when feasible   Labs reviewed; CBGs: 134 and 147 mg/dl today, BUN: 24 mg/dl, Ca: 8.6 mg/dl. Medications reviewed; sliding scale novolog, 40 mg oral protonix BID, 1 tablet senokot BID, 500 mg ascorbic acid/day, 220 mg zinc sulfate/day.      NUTRITION - FOCUSED PHYSICAL EXAM:  unable to complete for COVID+ patient  Diet Order:   Diet Order            Diet Heart Room service appropriate? Yes; Fluid consistency: Thin  Diet effective now               EDUCATION NEEDS:   No education needs have been identified at this time  Skin:  Skin Assessment: Reviewed RN Assessment  Last BM:  8/23  Height:   Ht Readings from Last 1 Encounters:  02/09/19 6' (1.829 m)    Weight:   Wt Readings from Last 1 Encounters:  02/17/19 79 kg    Ideal Body Weight:  80.9 kg  BMI:  Body mass index is 23.62 kg/m.  Estimated Nutritional Needs:   Kcal:  2100-2300 kcal  Protein:  110-120 grams  Fluid:  >/= 2.2 L/day     Jarome Matin, MS, RD, LDN, [facility] Inpatient Clinical Dietitian Pager # [phone] After hours/weekend pager # [phone]

## 2019-02-17 NOTE — Progress Notes (Addendum)
[facility] PROGRESS NOTE  Larry [name] [id number] DOB: 15-Apr-1951 DOA: 02/09/2019 PCP: Lawerance Cruel, MD  Brief summary   69 y.o.malewith medical history significant ofgranulomaatous lung disease, HTN, HLD and multiple other medical problems presenting with progressive SOB and weakness.   He was seen about 1 week ago in pulmonary clinic for similar symptoms. C/o fatigue, weight loss, and SOB. He reported that his O2 sats were in the 90's at home and was concerned about a recurrent pleural effusion. He notes these symptoms have continued which have led him to present today. He notes DOE. No CP. He denies fevers. He denies abdominal pain. He notes for at least the past day or so he's noticed dark red stools. He notes his son was COVID 19 positive 2-3 weeks ago.  Assessment/Plan:  COVID 19 Pneumonia: exposure to son 2-3 weeks ago. He was tested maybe 2 weeks ago and negative.  - Of note, he had increased SOB about 1 week ago and was seen in pulmonary clinic (notified MD about his positive status).  - He was started on O2.  - He had CXR on 8/11 concerning for multifocal opacities R>L concerning for multifocal pneumonia with small R effusion. - completed remdesivir (8/19-8/21). dexa (8/19>). Will transition to PO dexa on 8/25. Can complete PO regimen at home  -weaned from oxygen.   Acute Blood Loss Anemia 2/2 GI Bleed - describes as maroon stool. He takes ibuprofen occasionally. - at admission, Hb down to 6.9 from 12.7 in 2012 - s/p 1 unit pRBC; then another 2 units  - Placed on PPI gtt. Transition to PO - hospitalist spoke with GI8/19/20; unless hemodynamically unstable, no scope; continue PPI, transfusions. - (02/13/19); transfused 1 unit pRBCs; reports large stool last night no further bleeding since last 4 days.  Passing gas.     - no new episodes of bleeding. Hg at 8.4. Will recheck Hg AM.   BPH flomax  HTN  coreg  HLD continue lipitor  Hyperglycemia: ha1c-5.8  Dispo: Severe constipation.  Increase stool regimen.  Code Status: full Family Communication: d/w patient, his family. RN  Disposition Plan: home, possible 02/18/2019.   Consultants:  non  Procedures:  none  Antibiotics: Anti-infectives (From admission, onward)   Start     Dose/Rate Route Frequency Ordered Stop   02/10/19 1700  remdesivir 100 mg in sodium chloride 0.9 % 250 mL IVPB     100 mg 500 mL/hr over 30 Minutes Intravenous Every 24 hours 02/09/19 1541 02/14/19 0700   02/09/19 1700  remdesivir 200 mg in sodium chloride 0.9 % 250 mL IVPB     200 mg 500 mL/hr over 30 Minutes Intravenous Once 02/09/19 1541 02/09/19 2136      HPI/Subjective: No nausea no vomiting.  Passing gas.  No BM.  No active bleeding.  Objective: Vitals:   02/17/19 0603 02/17/19 1425  BP: 134/74 122/72  Pulse: 68 70  Resp: 18 18  Temp: 98 F (36.7 C) 98.1 F (36.7 C)  SpO2: 99% 100%    Intake/Output Summary (Last 24 hours) at 02/17/2019 1725 Last data filed at 02/17/2019 1250 Gross per 24 hour  Intake 120 ml  Output 720 ml  Net -600 ml   Filed Weights   02/15/19 0536 02/16/19 0457 02/17/19 0500  Weight: 79.6 kg 78.4 kg 79 kg    Exam:   General:  No distress   Cardiovascular: s1,s2 rrr  Respiratory: CTA BL  Abdomen: soft, nt   Musculoskeletal: no leg edema  Data Reviewed: Basic Metabolic Panel: Recent Labs  Lab 02/11/19 0305 02/12/19 0344 02/13/19 0400 02/14/19 0522 02/15/19 0409  NA 138 137 136 137 135  K 4.2 4.3 4.3 4.3 4.1  CL 104 108 106 106 104  CO2 22 22 22 23  19*  GLUCOSE 158* 148* 152* 136* 152*  BUN 16 15 23  24* 24*  CREATININE 0.88 0.96 0.88 0.82 0.82  CALCIUM 8.7* 8.7* 8.3* 8.4* 8.6*  MG 2.2 2.3 2.3 2.3 2.3  PHOS  --   --   --   --  4.5   Liver Function Tests: Recent Labs  Lab 02/11/19 0305 02/12/19 0344 02/13/19 0400 02/14/19 0522 02/15/19 0409  AST 20 21 17 15   --   ALT 15 15  17 17   --   ALKPHOS 66 56 58 53  --   BILITOT 0.5 0.5 0.5 0.4  --   PROT 6.6 6.5 5.8* 5.7*  --   ALBUMIN 2.6* 2.6* 2.5* 2.5* 2.6*   No results for input(s): LIPASE, AMYLASE in the last 168 hours. No results for input(s): AMMONIA in the last 168 hours. CBC: Recent Labs  Lab 02/11/19 0305  02/12/19 0344  02/13/19 0400  02/13/19 2143 02/14/19 0522 02/14/19 0907 02/15/19 0409 02/17/19 0405  WBC 11.0*  --  10.8*  --  9.8  --   --  8.7  --  8.7 9.6  NEUTROABS 8.8*  --  9.1*  --  8.0*  --   --  6.7  --  6.8  --   HGB 8.9*  8.8*   < > 8.3*   < > 7.1*  7.2*   < > 7.7* 8.1*  8.3* 8.4* 8.4* 9.2*  HCT 27.5*  27.1*   < > 26.0*   < > 22.6*  22.4*   < > 23.9* 25.4*  25.8* 26.5* 26.5* 28.5*  MCV 91.7  --  92.2  --  93.4  --   --  90.7  --  90.8 89.3  PLT 280  --  252  --  204  --   --  233  --  238 235   < > = values in this interval not displayed.   Cardiac Enzymes: No results for input(s): CKTOTAL, CKMB, CKMBINDEX, TROPONINI in the last 168 hours. BNP (last 3 results) No results for input(s): BNP in the last 8760 hours.  ProBNP (last 3 results) No results for input(s): PROBNP in the last 8760 hours.  CBG: Recent Labs  Lab 02/16/19 2132 02/16/19 2347 02/17/19 0825 02/17/19 1222 02/17/19 1718  GLUCAP 127* 117* 134* 147* 103*    Recent Results (from the past 240 hour(s))  SARS Coronavirus 2 [facility] order, Performed in [facility] hospital lab) Nasopharyngeal Nasopharyngeal Swab     Status: Abnormal   Collection Time: 02/09/19 12:35 PM   Specimen: Nasopharyngeal Swab  Result Value Ref Range Status   SARS Coronavirus 2 POSITIVE (A) NEGATIVE Final    Comment: RESULT CALLED TO, READ BACK BY AND VERIFIED WITH: S.LEONARD AT 1431 ON 02/09/19 BY N.THOMPSON (NOTE) If result is NEGATIVE SARS-CoV-2 target nucleic acids are NOT DETECTED. The SARS-CoV-2 RNA is generally detectable in upper and lower  respiratory specimens during the acute phase of infection. The lowest   concentration of SARS-CoV-2 viral copies this assay can detect is 250  copies / mL. A negative result does not preclude SARS-CoV-2 infection  and should not be used as the sole basis for treatment or other  patient management decisions.  A negative result may occur with  improper specimen collection / handling, submission of specimen other  than nasopharyngeal swab, presence of viral mutation(s) within the  areas targeted by this assay, and inadequate number of viral copies  (<250 copies / mL). A negative result must be combined with clinical  observations, patient history, and epidemiological information. If result is POSITIVE SARS-CoV-2 target nucleic acids are DETEC TED. The SARS-CoV-2 RNA is generally detectable in upper and lower  respiratory specimens during the acute phase of infection.  Positive  results are indicative of active infection with SARS-CoV-2.  Clinical  correlation with patient history and other diagnostic information is  necessary to determine patient infection status.  Positive results do  not rule out bacterial infection or co-infection with other viruses. If result is PRESUMPTIVE POSTIVE SARS-CoV-2 nucleic acids MAY BE PRESENT.   A presumptive positive result was obtained on the submitted specimen  and confirmed on repeat testing.  While 2019 novel coronavirus  (SARS-CoV-2) nucleic acids may be present in the submitted sample  additional confirmatory testing may be necessary for epidemiological  and / or clinical management purposes  to differentiate between  SARS-CoV-2 and other Sarbecovirus currently known to infect humans.  If clinically indicated additional testing with an alternate test  methodology (LAB7 453) is advised. The SARS-CoV-2 RNA is generally  detectable in upper and lower respiratory specimens during the acute  phase of infection. The expected result is Negative. Fact Sheet for Patients:  StrictlyIdeas.no Fact Sheet  for Healthcare Providers: BankingDealers.co.za This test is not yet approved or cleared by the [place] FDA and has been authorized for detection and/or diagnosis of SARS-CoV-2 by FDA under an Emergency Use Authorization (EUA).  This EUA will remain in effect (meaning this test can be used) for the duration of the COVID-19 declaration under Section 564(b)(1) of the Act, 21 U.S.C. section 360bbb-3(b)(1), unless the authorization is terminated or revoked sooner. Performed at [facility], [place] [address], [place], [place] [address]   Culture, blood (Routine X 2) w Reflex to ID Panel     Status: None   Collection Time: 02/09/19  7:01 PM   Specimen: BLOOD  Result Value Ref Range Status   Specimen Description   Final    BLOOD LEFT ANTECUBITAL Performed at [place] [address], [place], [place] [address]    Special Requests   Final    BOTTLES DRAWN AEROBIC ONLY Blood Culture adequate volume Performed at [place] [address], [place], [place] [address]    Culture   Final    NO GROWTH 5 DAYS Performed at [facility], [place] [address], [place], [place] [address]    Report Status 02/14/2019 FINAL  Final  Culture, blood (Routine X 2) w Reflex to ID Panel     Status: None   Collection Time: 02/09/19  7:01 PM   Specimen: BLOOD LEFT HAND  Result Value Ref Range Status   Specimen Description   Final    BLOOD LEFT HAND Performed at [facility] [address] [place], [place] [address]    Special Requests   Final    BOTTLES DRAWN AEROBIC ONLY Blood Culture adequate volume Performed at [facility] [address] [place], [place] [address]    Culture   Final    NO GROWTH 5 DAYS Performed at [facility], [place] [address] [place], [place] [address]    Report  Status 02/14/2019 FINAL  Final     Studies: No results found.  Scheduled  Meds: . sodium chloride   Intravenous Once  . sodium chloride   Intravenous Once  . atorvastatin  40 mg Oral q1800  . carvedilol  25 mg Oral BID  . dexamethasone  6 mg Oral QHS  . insulin aspart  0-5 Units Subcutaneous QHS  . insulin aspart  0-9 Units Subcutaneous TID WC  . [START ON 02/18/2019] multivitamin with minerals  1 tablet Oral Daily  . pantoprazole  40 mg Oral BID  . polyethylene glycol  17 g Oral Daily  . senna-docusate  1 tablet Oral BID  . tamsulosin  0.4 mg Oral Daily  . vitamin C  500 mg Oral Daily  . zinc sulfate  220 mg Oral Daily   Continuous Infusions:  Active Problems:   HTN (hypertension), benign   COVID-19 virus infection   Symptomatic anemia   GI bleed   BPH (benign prostatic hyperplasia)   HLD (hyperlipidemia)    Time spent: >25 minutes     [facility] If 7PM-7AM, please contact night-coverage at [email or web address], password [facility] 02/17/2019, 5:25 PM  LOS: 8 days

## 2019-02-18 LAB — GLUCOSE, CAPILLARY
Glucose-Capillary: 143 mg/dL — ABNORMAL HIGH (ref 70–99)
Glucose-Capillary: 133 mg/dL — ABNORMAL HIGH (ref 70–99)

## 2019-02-18 MED ORDER — DEXAMETHASONE 6 MG PO TABS: 6.0000 mg | ORAL_TABLET | Freq: Every day | ORAL | 0 refills | Status: DC

## 2019-02-18 MED ORDER — ZINC SULFATE 220 (50 ZN) MG PO CAPS: 220.0000 mg | ORAL_CAPSULE | Freq: Every day | ORAL | 0 refills | Status: DC

## 2019-02-18 MED ORDER — MAGNESIUM CITRATE PO SOLN
1.0000 | Freq: Once | ORAL | Status: AC
  Administered 2019-02-18: 1 via ORAL
  Filled 2019-02-18: qty 296

## 2019-02-18 MED ORDER — POLYETHYLENE GLYCOL 3350 17 G PO PACK: 17.0000 g | PACK | Freq: Every day | ORAL | 0 refills | Status: DC

## 2019-02-18 MED ORDER — ASCORBIC ACID 500 MG PO TABS: 500.0000 mg | ORAL_TABLET | Freq: Every day | ORAL | 0 refills | Status: DC

## 2019-02-18 NOTE — TOC Progression Note (Signed)
Transition of Care [facility]) - Progression Note    Patient Details  Name: Royall Colton MRN: [id number] Date of Birth: [birth year]  Transition of Care [facility]) CM/SW Contact  Purcell Mouton, RN Phone Number: 02/18/2019, 12:57 PM  Clinical Narrative:    Plan to discharge home, follow up with PCP. There are no needs at present time.    Expected Discharge Plan: Home/Self Care Barriers to Discharge: No Barriers Identified  Expected Discharge Plan and Services Expected Discharge Plan: Home/Self Care   Discharge Planning Services: CM Consult   Living arrangements for the past 2 months: Single Family Home Expected Discharge Date: 02/18/19                                     Social Determinants of Health (SDOH) Interventions    Readmission Risk Interventions No flowsheet data found.

## 2019-02-18 NOTE — Discharge Instructions (Signed)
COVID-19: How to Protect Yourself and Others Know how it spreads  There is currently no vaccine to prevent coronavirus disease 2019 (COVID-19).  The best way to prevent illness is to avoid being exposed to this virus.  The virus is thought to spread mainly from person-to-person. ? Between people who are in close contact with one another (within about 6 feet). ? Through respiratory droplets produced when an infected person coughs, sneezes or talks. ? These droplets can land in the mouths or noses of people who are nearby or possibly be inhaled into the lungs. ? Some recent studies have suggested that COVID-19 may be spread by people who are not showing symptoms. Everyone should Clean your hands often  Wash your hands often with soap and water for at least 20 seconds especially after you have been in a public place, or after blowing your nose, coughing, or sneezing.  If soap and water are not readily available, use a hand sanitizer that contains at least 60% alcohol. Cover all surfaces of your hands and rub them together until they feel dry.  Avoid touching your eyes, nose, and mouth with unwashed hands. Avoid close contact  Stay home if you are sick.  Avoid close contact with people who are sick.  Put distance between yourself and other people. ? Remember that some people without symptoms may be able to spread virus. ? This is especially important for people who are at higher risk of getting very [email or web address] Cover your mouth and nose with a cloth face cover when around others  You could spread COVID-19 to others even if you do not feel sick.  Everyone should wear a cloth face cover when they have to go out in public, for example to the grocery store or to pick up other necessities. ? Cloth face coverings should not be placed on young children under age 39, anyone who has trouble breathing, or is unconscious,  incapacitated or otherwise unable to remove the mask without assistance.  The cloth face cover is meant to protect other people in case you are infected.  Do NOT use a facemask meant for a Dietitian.  Continue to keep about 6 feet between yourself and others. The cloth face cover is not a substitute for social distancing. Cover coughs and sneezes  If you are in a private setting and do not have on your cloth face covering, remember to always cover your mouth and nose with a tissue when you cough or sneeze or use the inside of your elbow.  Throw used tissues in the trash.  Immediately wash your hands with soap and water for at least 20 seconds. If soap and water are not readily available, clean your hands with a hand sanitizer that contains at least 60% alcohol. Clean and disinfect  Clean AND disinfect frequently touched surfaces daily. This includes tables, doorknobs, light switches, countertops, handles, desks, phones, keyboards, toilets, faucets, and sinks. [email or web address]  If surfaces are dirty, clean them: Use detergent or soap and water prior to disinfection.  Then, use a household disinfectant. You can see a list of EPA-registered household disinfectants here. [email or web address] 10/27/2018 This information is not intended to replace advice given to you by your health care provider. Make sure you discuss any questions you have with your health care provider. Document Released: 10/06/2018 Document Revised: 11/04/2018 Document Reviewed: 10/06/2018 Elsevier Patient Education  [place] if You Are Sick If you are sick with COVID-19 or think you  might have COVID-19, follow the steps below to help protect other people in your home and community. Stay home except to get medical care.  Stay home. Most people with COVID-19 have mild illness and are able to recover at home  without medical care. Do not leave your home, except to get medical care. Do not visit public areas.  Take care of yourself. Get rest and stay hydrated.  Get medical care when needed. Call your doctor before you go to their office for care. But, if you have trouble breathing or other concerning symptoms, call 911 for immediate help.  Avoid public transportation, ride-sharing, or taxis. Separate yourself from other people and pets in your home.  As much as possible, stay in a specific room and away from other people and pets in your home. Also, you should use a separate bathroom, if available. If you need to be around other people or animals in or outside of the home, wear a cloth face covering. ? See COVID-19 and Animals if you have questions about pets: [email or web address]/ Monitor your symptoms.  Common symptoms of COVID-19 include fever and cough. Trouble breathing is a more serious symptom that means you should get medical attention.  Follow care instructions from your healthcare provider and local health department. Your local health authorities will give instructions on checking your symptoms and reporting information. If you develop emergency warning signs for COVID-19 get medical attention immediately.  Emergency warning signs include*:  Trouble breathing  Persistent pain or pressure in the chest  New confusion or not able to be woken  Bluish lips or face *This list is not all inclusive. Please consult your medical provider for any other symptoms that are severe or concerning to you. Call 911 if you have a medical emergency. If you have a medical emergency and need to call 911, notify the operator that you have or think you might have, COVID-19. If possible, put on a facemask before medical help arrives. Call ahead before visiting your doctor.  Call ahead. Many medical visits for routine care are being postponed or done by phone or  telemedicine.  If you have a medical appointment that cannot be postponed, call your doctor's office. This will help the office protect themselves and other patients. If you are sick, wear a cloth covering over your nose and mouth.  You should wear a cloth face covering over your nose and mouth if you must be around other people or animals, including pets (even at home).  You don't need to wear the cloth face covering if you are alone. If you can't put on a cloth face covering (because of trouble breathing for example), cover your coughs and sneezes in some other way. Try to stay at least 6 feet away from other people. This will help protect the people around you. Note: During the COVID-19 pandemic, medical grade facemasks are reserved for healthcare workers and some first responders. You may need to make a cloth face covering using a scarf or bandana. Cover your coughs and sneezes.  Cover your mouth and nose with a tissue when you cough or sneeze.  Throw used tissues in a lined trash can.  Immediately wash your hands with soap and water for at least 20 seconds. If soap and water are not available, clean your hands with an alcohol-based hand sanitizer that contains at least 60% alcohol. Clean your hands often.  Wash your hands often with soap and water for at least 20 seconds. This is especially  important after blowing your nose, coughing, or sneezing; going to the bathroom; and before eating or preparing food.  Use hand sanitizer if soap and water are not available. Use an alcohol-based hand sanitizer with at least 60% alcohol, covering all surfaces of your hands and rubbing them together until they feel dry.  Soap and water are the best option, especially if your hands are visibly dirty.  Avoid touching your eyes, nose, and mouth with unwashed hands. Avoid sharing personal household items.  Do not share dishes, drinking glasses, cups, eating utensils, towels, or bedding with other people  in your home.  Wash these items thoroughly after using them with soap and water or put them in the dishwasher. Clean all "high-touch" surfaces everyday.  Clean and disinfect high-touch surfaces in your "sick room" and bathroom. Let someone else clean and disinfect surfaces in common areas, but not your bedroom and bathroom.  If a caregiver or other person needs to clean and disinfect a sick person's bedroom or bathroom, they should do so on an as-needed basis. The caregiver/other person should wear a mask and wait as long as possible after the sick person has used the bathroom. High-touch surfaces include phones, remote controls, counters, tabletops, doorknobs, bathroom fixtures, toilets, keyboards, tablets, and bedside tables.  Clean and disinfect areas that may have blood, stool, or body fluids on them.  Use household cleaners and disinfectants. Clean the area or item with soap and water or another detergent if it is dirty. Then use a household disinfectant. ? Be sure to follow the instructions on the label to ensure safe and effective use of the product. Many products recommend keeping the surface wet for several minutes to ensure germs are killed. Many also recommend precautions such as wearing gloves and making sure you have good ventilation during use of the product. ? Most EPA-registered household disinfectants should be effective. How to discontinue home isolation  People with COVID-19 who have stayed home (home isolated) can stop home isolation under the following conditions: ? If you will not have a test to determine if you are still contagious, you can leave home after these three things have happened:  You have had no fever for at least 72 hours (that is three full days of no fever without the use of medicine that reduces fevers) AND  other symptoms have improved (for example, when your cough or shortness of breath has improved) AND  at least 10 days have passed since your  symptoms first appeared. ? If you will be tested to determine if you are still contagious, you can leave home after these three things have happened:  You no longer have a fever (without the use of medicine that reduces fevers) AND  other symptoms have improved (for example, when your cough or shortness of breath has improved) AND  you received two negative tests in a row, 24 hours apart. Your doctor will follow CDC guidelines. In all cases, follow the guidance of your healthcare provider and local health department. The decision to stop home isolation should be made in consultation with your healthcare provider and state and local health departments. Local decisions depend on local circumstances. [email or web address] 10/25/2018 This information is not intended to replace advice given to you by your health care provider. Make sure you discuss any questions you have with your health care provider. Document Released: 10/06/2018 Document Revised: 11/04/2018 Document Reviewed: 10/06/2018 Elsevier Patient Education  [place].

## 2019-02-18 NOTE — Progress Notes (Signed)
All discharge information including meds, prescriptions, follow-up and at-home covid precaution discussed with patient. IV taken out and belongings given back to patient. Pt verbalized understanding and no further questions were ask.

## 2019-02-20 ENCOUNTER — Other Ambulatory Visit: Payer: Self-pay | Admitting: Internal Medicine

## 2019-02-20 NOTE — Discharge Summary (Signed)
[facility] Discharge Summary   Patient: Larry [name] [id number]   PCP: Lawerance Cruel, MD DOB: [birth year]   Date of admission: 02/09/2019   Date of discharge: 02/18/2019     Discharge Diagnoses:  Principal diagnosis Acute COVID-19 infection with GI bleed  Active Problems:   HTN (hypertension), benign   COVID-19 virus infection   Symptomatic anemia   GI bleed   BPH (benign prostatic hyperplasia)   HLD (hyperlipidemia)   Admitted From: home Disposition:  Home   Recommendations for Outpatient Follow-up:  1. Please follow-up with PCP in 1 week. 2. Patient recommended to maintain isolation for 5 more days.  Follow-up Information    Lawerance Cruel, MD. Schedule an appointment as soon as possible for a visit in 1 week(s).   Specialty: Family Medicine Contact information: [id number] [place] [address] [phone]          Diet recommendation: Cardiac diet  Activity: The patient is advised to gradually reintroduce usual activities,as tolerated .  Discharge Condition: good  Code Status: Full code  History of present illness: As per the H and P dictated on admission, "Larry [name] is a 68 y.o. male with medical history significant of granulomaatous lung disease, HTN, HLD and multiple other medical problems presenting with progressive SOB and weakness.   He was seen about 1 week ago in pulmonary clinic for similar symptoms.  C/o fatigue, weight loss, and SOB.  He reported that his O2 sats were in the 90's at home and was concerned about a recurrent pleural effusion.  He notes these symptoms have continued which have led him to present today.  He notes DOE.  No CP.  He denies fevers.  He denies abdominal pain.  He notes for at least the past day or so he's noticed dark red stools.  He notes his son was COVID 19 positive 2-3 weeks ago.    ED Course: Labs, imaging, pRBC transfusion.  Admit for GI bleed.  COVID 19 positive"  Hospital Course:   Summary of his active problems in the hospital is as following. COVID 19 Pneumonia:  exposure to son 2-3 weeks ago. He was tested maybe 2 weeks ago and negative.  Of note, he had increased SOB about 1 week ago and was seen in pulmonary clinic (notified MD about his positive status).  He was started on O2.  He had CXR on 8/11 concerning for multifocal opacities R>L concerning for multifocal pneumonia with small R effusion. completed remdesivir (8/19-8/21). dexa (8/19>).  weaned from oxygen.  Continue steroids on discharge  Acute Blood Loss Anemia 2/2 GI Bleed describes as maroon stool. He takes ibuprofen occasionally. at admission, Hb down to 6.9 from 12.7 in 2012 s/p 1 unit pRBC; then another 2 units  Placed on PPI gtt. Transition to PO hospitalist spoke with GI8/19/20; unless hemodynamically unstable, no scope; continue PPI, transfusions. (02/13/19); transfused 1 unit pRBCs; reports large stool last night no further bleeding since last 4 days.  Passing gas.  no new episodes of bleeding. Hg stable.  Patient actually had a bronchoscopy without any blood.  BPH flomax  HTN coreg  HLD continue lipitor  Hyperglycemia: ha1c-5.8  Constipation. Resolved prior to discharge.  Patient was ambulatory without any assistance. On the day of the discharge the patient's vitals were stable, and no other acute medical condition were reported by patient. the patient was felt safe to be discharge at Home with no therapy needed on discharge.  Consultants: gastroenterology [facility] Procedures:  none  DISCHARGE MEDICATION: Allergies as of 02/18/2019   No Known Allergies     Medication List    STOP taking these medications   acetaminophen-codeine 300-30 MG tablet Commonly known as: TYLENOL #3   ibuprofen 800 MG tablet Commonly known as: ADVIL   nebivolol 10 MG tablet Commonly known as: Bystolic     TAKE these medications   ascorbic acid 500 MG tablet Commonly known as: VITAMIN C  Take 1 tablet (500 mg total) by mouth daily.   atorvastatin 40 MG tablet Commonly known as: LIPITOR Take 40 mg by mouth daily at 6 PM.   carvedilol 25 MG tablet Commonly known as: COREG Take 25 mg by mouth 2 (two) times daily with a meal.   dexamethasone 6 MG tablet Commonly known as: DECADRON Take 1 tablet (6 mg total) by mouth at bedtime.   fluticasone 50 MCG/ACT nasal spray Commonly known as: FLONASE Place 2 sprays into the nose daily as needed for allergies.   hydrocortisone 2.5 % cream Apply 1 application topically 2 (two) times daily.   polyethylene glycol 17 g packet Commonly known as: MIRALAX / GLYCOLAX Take 17 g by mouth daily.   tamsulosin 0.4 MG Caps capsule Commonly known as: FLOMAX Take 0.4 mg by mouth daily.   zinc sulfate 220 (50 Zn) MG capsule Take 1 capsule (220 mg total) by mouth daily.      No Known Allergies Discharge Instructions    Diet - low sodium heart healthy   Complete by: As directed    Discharge instructions   Complete by: As directed    It is important that you read the given instructions as well as go over your medication list with RN to help you understand your care after this hospitalization.  Discharge Instructions: Please follow-up with PCP in 1-2 weeks  Please request your primary care physician to go over all Hospital Tests and Procedure/Radiological results at the follow up. Please get all Hospital records sent to your PCP by signing hospital release before you go home.   Do not take more than prescribed Pain, Sleep and Anxiety Medications. You were cared for by a hospitalist during your hospital stay. If you have any questions about your discharge medications or the care you received while you were in the hospital after you are discharged, you can call the unit @UNIT @ you were admitted to and ask to speak with the hospitalist on call if the hospitalist that took care of you is not available.  Once you are discharged, your  primary care physician will handle any further medical issues. Please note that NO REFILLS for any discharge medications will be authorized once you are discharged, as it is imperative that you return to your primary care physician (or establish a relationship with a primary care physician if you do not have one) for your aftercare needs so that they can reassess your need for medications and monitor your lab values. You Must read complete instructions/literature along with all the possible adverse reactions/side effects for all the Medicines you take and that have been prescribed to you. Take any new Medicines after you have completely understood and accept all the possible adverse reactions/side effects. Wear Seat belts while driving. If you have smoked or chewed Tobacco in the last 2 yrs please stop smoking and/or stop any Recreational drug use.  If you drink alcohol, please moderate the use and do not drive, operating heavy machinery, perform activities at heights, swimming or participation in water  activities or provide baby sitting services under influence.   Increase activity slowly   Complete by: As directed      Discharge Exam: Filed Weights   02/16/19 0457 02/17/19 0500 02/18/19 0557  Weight: 78.4 kg 79 kg 79.6 kg   Vitals:   02/18/19 0557 02/18/19 1157  BP: 135/70 (!) 143/82  Pulse: 64 64  Resp: 18 16  Temp: 98 F (36.7 C) 98.1 F (36.7 C)  SpO2: 100% 100%   General: Appear in no distress, no Rash; Oral Mucosa Clear, moist. no Abnormal Mass Or lumps Cardiovascular: S1 and S2 Present, no Murmur, Respiratory: normal respiratory effort, Bilateral Air entry present and Clear to Auscultation, no Crackles, no wheezes Abdomen: Bowel Sound present, Soft and no tenderness, no hernia Extremities: no Pedal edema, no calf tenderness Neurology: alert and oriented to time, place, and person affect appropriate. normal without focal findings, mental status, speech normal, alert and oriented  x3, PERLA, Motor strength 5/5 and symmetric and sensation grossly normal to light touch   The results of significant diagnostics from this hospitalization (including imaging, microbiology, ancillary and laboratory) are listed below for reference.    Significant Diagnostic Studies: Dg Chest 1 View  Result Date: 02/09/2019 CLINICAL DATA:  Shortness of breath. EXAM: CHEST  1 VIEW COMPARISON:  Radiographs of February 02, 2019. FINDINGS: Stable cardiomediastinal silhouette. No pneumothorax is noted. Left lung opacities noted on prior exam are nearly resolved currently. Mild right pleural effusion is noted with associated atelectasis or infiltrate. Bony thorax is unremarkable. IMPRESSION: Mild right pleural effusion is noted with associated right basilar atelectasis or infiltrate. Electronically Signed   By: Marijo Conception M.D.   On: 02/09/2019 13:06   Dg Chest 2 View  Result Date: 02/02/2019 CLINICAL DATA:  Shortness of breath EXAM: CHEST - 2 VIEW COMPARISON:  10/242013 FINDINGS: There is extensive fibrosis throughout the lungs bilaterally with areas of scarring. There is a small right pleural effusion with multiple areas of consolidation throughout the right lung, most severely in the right mid and lower lung zones. Areas of more patchy infiltrate noted on the left. Heart size is upper normal with pulmonary vascularity normal. No adenopathy. No bone lesions. IMPRESSION: Underlying fibrosis. Multifocal opacities, more on the right than the left, likely due to multifocal pneumonia. Small right pleural effusion noted. No adenopathy evident. Electronically Signed   By: Lowella Grip III M.D.   On: 02/02/2019 09:49   Vas [place] Lower Extremity Venous (dvt)  Result Date: 02/12/2019  Lower Venous Study Indications: Elevated d-dimer.  Comparison Study: no previous Performing Technologist: Abram Sander RVS  Examination Guidelines: A complete evaluation includes B-mode imaging, spectral Doppler, color Doppler, and  power Doppler as needed of all accessible portions of each vessel. Bilateral testing is considered an integral part of a complete examination. Limited examinations for reoccurring indications may be performed as noted.  +---------+---------------+---------+-----------+----------+--------------+ RIGHT    CompressibilityPhasicitySpontaneityPropertiesThrombus Aging +---------+---------------+---------+-----------+----------+--------------+ CFV      Full           Yes      Yes                                 +---------+---------------+---------+-----------+----------+--------------+ SFJ      Full                                                        +---------+---------------+---------+-----------+----------+--------------+  FV Prox  Full                                                        +---------+---------------+---------+-----------+----------+--------------+ FV Mid   Partial                                                     +---------+---------------+---------+-----------+----------+--------------+ FV DistalFull                                                        +---------+---------------+---------+-----------+----------+--------------+ PFV      Full                                                        +---------+---------------+---------+-----------+----------+--------------+ POP      Full           Yes      Yes                                 +---------+---------------+---------+-----------+----------+--------------+ PTV      Full                                                        +---------+---------------+---------+-----------+----------+--------------+ PERO     Full                                                        +---------+---------------+---------+-----------+----------+--------------+   +---------+---------------+---------+-----------+----------+--------------+ LEFT      CompressibilityPhasicitySpontaneityPropertiesThrombus Aging +---------+---------------+---------+-----------+----------+--------------+ CFV      Full           Yes      Yes                                 +---------+---------------+---------+-----------+----------+--------------+ SFJ      Full                                                        +---------+---------------+---------+-----------+----------+--------------+ FV Prox  Full                                                        +---------+---------------+---------+-----------+----------+--------------+  FV Mid   Full                                                        +---------+---------------+---------+-----------+----------+--------------+ FV DistalFull                                                        +---------+---------------+---------+-----------+----------+--------------+ PFV      Full                                                        +---------+---------------+---------+-----------+----------+--------------+ POP      Full           Yes      Yes                                 +---------+---------------+---------+-----------+----------+--------------+ PTV      Full                                                        +---------+---------------+---------+-----------+----------+--------------+ PERO                                                  Not visualized +---------+---------------+---------+-----------+----------+--------------+     Summary: Right: There is no evidence of deep vein thrombosis in the lower extremity. No cystic structure found in the popliteal fossa. Left: There is no evidence of deep vein thrombosis in the lower extremity. No cystic structure found in the popliteal fossa.  *See table(s) above for measurements and observations. Electronically signed by Monica Martinez MD on 02/12/2019 at 5:01:11 PM.    Final     Microbiology: No results  found for this or any previous visit (from the past 240 hour(s)).   Labs: CBC: Recent Labs  Lab 02/13/19 2143 02/14/19 0522 02/14/19 0907 02/15/19 0409 02/17/19 0405  WBC  --  8.7  --  8.7 9.6  NEUTROABS  --  6.7  --  6.8  --   HGB 7.7* 8.1*  8.3* 8.4* 8.4* 9.2*  HCT 23.9* 25.4*  25.8* 26.5* 26.5* 28.5*  MCV  --  90.7  --  90.8 89.3  PLT  --  233  --  238 [id number]   Basic Metabolic Panel: Recent Labs  Lab 02/14/19 0522 02/15/19 0409  NA 137 135  K 4.3 4.1  CL 106 104  CO2 23 19*  GLUCOSE 136* 152*  BUN 24* 24*  CREATININE 0.82 0.82  CALCIUM 8.4* 8.6*  MG 2.3 2.3  PHOS  --  4.5   Liver Function Tests: Recent Labs  Lab 02/14/19 0522 02/15/19 0409  AST 15  --  ALT 17  --   ALKPHOS 53  --   BILITOT 0.4  --   PROT 5.7*  --   ALBUMIN 2.5* 2.6*   No results for input(s): LIPASE, AMYLASE in the last 168 hours. No results for input(s): AMMONIA in the last 168 hours. Cardiac Enzymes: No results for input(s): CKTOTAL, CKMB, CKMBINDEX, TROPONINI in the last 168 hours. BNP (last 3 results) No results for input(s): BNP in the last 8760 hours. CBG: Recent Labs  Lab 02/17/19 1222 02/17/19 1718 02/17/19 2149 02/18/19 0816 02/18/19 1156  GLUCAP 147* 103* 101* 143* 133*   Time spent: 35 minutes  Signed:  Berle Mull  [facility] 02/18/2019

## 2019-02-22 DIAGNOSIS — R0600 Dyspnea, unspecified: Secondary | ICD-10-CM | POA: Diagnosis not present

## 2019-02-22 DIAGNOSIS — K922 Gastrointestinal hemorrhage, unspecified: Secondary | ICD-10-CM | POA: Diagnosis not present

## 2019-02-22 DIAGNOSIS — U071 COVID-19: Secondary | ICD-10-CM | POA: Diagnosis not present

## 2019-02-22 DIAGNOSIS — Z09 Encounter for follow-up examination after completed treatment for conditions other than malignant neoplasm: Secondary | ICD-10-CM | POA: Diagnosis not present

## 2019-02-23 NOTE — Addendum Note (Signed)
Addended by: Parke Poisson E on: 02/23/2019 02:00 PM   Modules accepted: Orders

## 2019-02-24 ENCOUNTER — Inpatient Hospital Stay: Admission: RE | Admit: 2019-02-24 | Payer: Medicare Other | Source: Ambulatory Visit

## 2019-02-25 ENCOUNTER — Other Ambulatory Visit: Payer: Self-pay | Admitting: *Deleted

## 2019-02-25 NOTE — Patient Outreach (Signed)
Red EMMI flag received for 02/23/19, "sad, hopeless, anxious", outreach call to pt , spoke with pt, discussed Red flag and pt states " no, that's not correct, I don't have that".  Pt denies any concerns or questions.  Jacqlyn Larsen [facility], [place] Coordinator [phone]

## 2019-03-02 DIAGNOSIS — K922 Gastrointestinal hemorrhage, unspecified: Secondary | ICD-10-CM | POA: Diagnosis not present

## 2019-03-04 DIAGNOSIS — K922 Gastrointestinal hemorrhage, unspecified: Secondary | ICD-10-CM | POA: Diagnosis not present

## 2019-03-04 DIAGNOSIS — K929 Disease of digestive system, unspecified: Secondary | ICD-10-CM | POA: Diagnosis not present

## 2019-03-08 ENCOUNTER — Encounter: Payer: Self-pay | Admitting: Internal Medicine

## 2019-03-08 ENCOUNTER — Other Ambulatory Visit: Payer: Self-pay

## 2019-03-08 ENCOUNTER — Ambulatory Visit (INDEPENDENT_AMBULATORY_CARE_PROVIDER_SITE_OTHER): Payer: Medicare Other

## 2019-03-08 ENCOUNTER — Ambulatory Visit (INDEPENDENT_AMBULATORY_CARE_PROVIDER_SITE_OTHER): Payer: Medicare Other | Admitting: Internal Medicine

## 2019-03-08 DIAGNOSIS — J90 Pleural effusion, not elsewhere classified: Secondary | ICD-10-CM | POA: Diagnosis not present

## 2019-03-08 DIAGNOSIS — R0609 Other forms of dyspnea: Secondary | ICD-10-CM

## 2019-03-08 DIAGNOSIS — K922 Gastrointestinal hemorrhage, unspecified: Secondary | ICD-10-CM | POA: Diagnosis not present

## 2019-03-08 DIAGNOSIS — R0600 Dyspnea, unspecified: Secondary | ICD-10-CM

## 2019-03-08 DIAGNOSIS — U071 COVID-19: Secondary | ICD-10-CM | POA: Diagnosis not present

## 2019-03-08 DIAGNOSIS — R0602 Shortness of breath: Secondary | ICD-10-CM | POA: Diagnosis not present

## 2019-03-08 DIAGNOSIS — Z09 Encounter for follow-up examination after completed treatment for conditions other than malignant neoplasm: Secondary | ICD-10-CM | POA: Diagnosis not present

## 2019-03-08 NOTE — Patient Instructions (Addendum)
Please remember to go to the lab and x-ray department   for your tests - we will call you with the results when they are available.     Please schedule a follow up office visit in 2 weeks, sooner if needed  with all medications /inhalers/ solutions in hand so we can verify exactly what you are taking. This includes all medications from all doctors and over the counters Add:  Needs repeat anca next ov

## 2019-03-08 NOTE — Progress Notes (Addendum)
Subjective:    Patient ID: Larry [name], male    DOB: [birth year]   MRN: [id number]   Brief patient profile:  68  yobm  Never smoked in country since around 1990 from [place] with very atypical ? Sarcoid/ r effusion 2012  HPI 10/31/2010 Initial pulmonary office eval cc cough x 2 months and sob x sev weeks x fast walk,  Really not very productive. Large r effusion rec dx tap > improved sob for a least a week.  Non dx features of chronic exudate  11/12/2010 ov/Deshon Koslowski cc sob with ex recurrent but not as severe, still dry cough.   rec vats bx  Nov 16, 2010 EBUS and biopsy as well as right VATS and PleuX  placement on  >   cultures all neg, granulmatous inflammation on pleural bx's with necrosis but neg smears/cultures to date with ID f/u planned 6/26  12/13/2010 ov cc worse sob and cough since pleurex removed 6/12, sob mostly with steps/inclines, ok lying flat. Cough is dry and worse supine but breath ok flat. rec Stop atenolol Start bystolic 10 mg one daily - if blood pressure too low (light headed standing) then stop the caduet until next visit As long as coughing you need to take prilosec 20 mg Take 30-60 min before first meal of the day and Pepcid 20 mg at bedtime GERD (REFLUX)   12/18/10 start prednisone 20 mg per day by ID.  01/11/11 ov/Kaylee Trivett cc cough and sob better and able to do more including steps on 20 mg per day but not back to baseline yet rec Reduce prednisone to 20 mg tablet   Take one half daily once you are better to your satisfaction. We always try to reduce the prednisone to the lowest possible dose.   02/22/2011 f/u ov/Viera Okonski cc all better, no cough or sob but did not follow instructions to lower prednisone dose and still on 20. rec Try prednisone 10 mg one daily ok to  increase to 20 mg per day if worsen cough or breathing   04/09/2011 f/u ov/Dany Harten  On pred 10 mg per day cc cough gone, no sob. Some sense of nasal obstruction and throat irritation ? Worse when stopped using  gerd rx consistently.  No rash, ocular or articular co's. No sob. rec reduce prednisone to 10 mg qod     05/13/2011 f/u ov/Ellyson Rarick cc no flare of cough or sob with taper pred to 10 mg qod - only resp complaint is nasal obst despite use of nasal steroids x months, no sinus pain or purulent nasal secretions rec Reduce prednisone to 5 mg every other day  Ok to change the nexium to generic protonix (pantoprazole) 40 mg before bfast daily  I emphasized that nasal steroids (flonase)  have no immediate benefit ivn".  Please schedule a follow up office visit in 4 weeks, sooner if needed with cxr on return   06/14/2011 f/u ov/Nafisa Olds on 5mg  qod  cc increase cough assoc with watery sinus drainage x 2 days - otherwise was doing well on qod dosing, no arthralgias/ocular complaints, rash or sob. rec If cough or breathing worse, ok to increase prednisone to 5 mg two daily until better, the one daily for a week then resume one on even days    07/31/2011 f/u ov/Bucky Grigg weaned prednisone back to 5 mg every other day s flare of cough, minimal sinus drainage. Sob when bends only. No ocurlar or articular symptoms or rash. rec Try 5 mg prednisone one half  on even days x 6 weeks then return for cxr, office visit sooner if worse pain, breathing or cough When nose is more congested, try afrin then flonase twice daily x 5 days then stop afrin.  09/18/2011 f/u ov/Jerol Rufener cc main c/o is nasal congestion much better p afrin vs flonase alone, mostly R side with neg sinus ct noted 03/2011. No sob, cough, no problem with the 2.5 mg qod dosing of pred nor arthralgias/ rash. rec Stop the prednisone completely unless the symptoms you had before come back, then just restart the one half every day.    10/16/2011 f/u ov/Gianny Killman cc stopped prednisone one week prior to OV   No cough or sob. No fatigue or nausea. rec No change rx     01/14/2012 f/u ov/Aynslee Mulhall cc overall feel better, occ cough with min discolored sputum. No sob, articular  or ocular complaints or rash- only problem is persistent positional pain along distribution of rib where underwent vats, still using tylenol #3 at hs several times a week.  rec Zostrix cream apply 4 x daily until better, then just at bedtime Only use the Tylenol # 3 if needed for intolerable pain as it does not actually address the cause of the problem  04/16/2012 f/u ov/Itzy Adler cc cough resolved, pain better on zostrix > only one Tylenol #3 per week and good activity tol with no doe limiting or significant arthritis.  rec  You will likely have permanent scarring in right chest of no clinical significance.    02/02/2019 Eval by Dr Carlis Abbott . DG Chest 2 View  . CT Chest High Resolution  . 6 minute walk  . Pulmonary function test   rec: hold off pred for now  Collagen vasc screen neg x for C-Anca Pos c titer 1:40    Date of admission: 02/09/2019             Date of discharge: 02/18/2019     Discharge Diagnoses:  Acute COVID-19 infection with GI bleed   HTN (hypertension), benign   COVID-19 virus infection   Symptomatic anemia   GI bleed   BPH (benign prostatic hyperplasia)   HLD (hyperlipidemia)   Admitted From: home Disposition:  Home   Recommendations for Outpatient Follow-up:  1. Please follow-up with PCP in 1 week. 2. Patient recommended to maintain isolation for 5 more days.    Diet recommendation: Cardiac diet  Activity: The patient is advised to gradually reintroduce usual activities,as tolerated .  Discharge Condition: good  Code Status: Full code  History of present illness: As per the H and P dictated on admission, "Julien Koromais a 68 y.o.malewith medical history significant ofgranulomaatous lung disease, HTN, HLD and multiple other medical problems presenting with progressive SOB and weakness.   He was seen about 1 week ago in pulmonary clinic for similar symptoms. C/o fatigue, weight loss, and SOB. He reported that his O2 sats were in the 90's at  home and was concerned about a recurrent pleural effusion.   No CP. He denies fevers. He denies abdominal pain. He notes for at least the past day or so he's noticed dark red stools. He notes his son was COVID 19 positive 2-3 weeks ago.   ED Course:Labs, imaging, pRBC transfusion. Admit for GI bleed. COVID 19 positive"  Hospital Course:  Summary of his active problems in the hospital is as following. COVID 19 Pneumonia:  exposure to son 2-3 weeks ago. He was tested maybe 2 weeks ago and negative.  Of note,  he had increased SOB about 1 week ago and was seen in pulmonary clinic (notified MD about his positive status).  He was started on O2.  He had CXR on 8/11 concerning for multifocal opacities R>L concerning for multifocal pneumonia with small R effusion. completed remdesivir (8/19-8/21). dexa (8/19>).  weaned from oxygen.  Continue steroids on discharge  Acute Blood Loss Anemia 2/2 GI Bleed describes as maroon stool. He takes ibuprofen occasionally. at admission, Hb down to 6.9 from 12.7 in 2012 s/p 1 unit pRBC; then another 2 units  Placed on PPI gtt. Transition to PO hospitalist spoke with GI8/19/20; unless hemodynamically unstable, no scope; continue PPI, transfusions. (02/13/19); transfused 1 unit pRBCs; reports large stool last nightno further bleeding since last 4 days. Passing gas.  no new episodes of bleeding. Hg stable.    BPHflomax  HTNcoreg  HLDcontinue lipitor  Hyperglycemia:ha1c-5.8  Constipation. Resolved prior to discharge.  Patient was ambulatory without any assistance. On the day of the discharge the patient's vitals were stable, and no other acute medical condition were reported by patient. the patient was felt safe to be discharge at Home with no therapy needed on discharge.  Consultants: gastroenterology [facility] Procedures: none  DISCHARGE MEDICATION: Allergies as of 02/18/2019   No Known Allergies        Medication  List    STOP taking these medications   acetaminophen-codeine 300-30 MG tablet Commonly known as: TYLENOL #3   ibuprofen 800 MG tablet Commonly known as: ADVIL   nebivolol 10 MG tablet Commonly known as: Bystolic     TAKE these medications   ascorbic acid 500 MG tablet Commonly known as: VITAMIN C Take 1 tablet (500 mg total) by mouth daily.   atorvastatin 40 MG tablet Commonly known as: LIPITOR Take 40 mg by mouth daily at 6 PM.   carvedilol 25 MG tablet Commonly known as: COREG Take 25 mg by mouth 2 (two) times daily with a meal.   dexamethasone 6 MG tablet Commonly known as: DECADRON Take 1 tablet (6 mg total) by mouth at bedtime.   fluticasone 50 MCG/ACT nasal spray Commonly known as: FLONASE Place 2 sprays into the nose daily as needed for allergies.   hydrocortisone 2.5 % cream Apply 1 application topically 2 (two) times daily.   polyethylene glycol 17 g packet Commonly known as: MIRALAX / GLYCOLAX Take 17 g by mouth daily.   tamsulosin 0.4 MG Caps capsule Commonly known as: FLOMAX Take 0.4 mg by mouth daily.   zinc sulfate 220 (50 Zn) MG capsule Take 1 capsule (220 mg total) by mouth daily.       03/08/2019  f/u ov/Hy Swiatek re: recurrent onset sob since summer 2020  Chief Complaint  Patient presents with  . Follow-up    OV with 70mw. Patient has previously seen Dr. Carlis Abbott. States his breathing has been ok since last visit.   Dyspnea: improved = doe with  Steps or more than slow pace sob = MMRC2 = can't walk a nl pace on a flat grade s sob but does fine slow and flat  Cough: at hs nightly  then clears then recurs sporadically daytime with minimal mucoid sputum  Sleeping: flat bed two pillows better on R side down  SABA use: none  02: denies having any supplemental 02 at this point    No obvious day to day or daytime variability or assoc excess/ purulent sputum or mucus plugs or hemoptysis or cp or chest tightness, subjective wheeze or overt  sinus or hb symptoms.   Sleeping as above  without nocturnal  or early am exacerbation  of respiratory  c/o's or need for noct saba. Also denies any obvious fluctuation of symptoms with weather or environmental changes or other aggravating or alleviating factors except as outlined above   No unusual exposure hx or h/o childhood pna/ asthma or knowledge of premature birth.  Current Allergies, Complete Past Medical History, Past Surgical History, Family History, and Social History were reviewed in [employer] record.  ROS  The following are not active complaints unless bolded Hoarseness, sore throat, dysphagia, dental problems, itching, sneezing,  nasal congestion or discharge of excess mucus or purulent secretions, ear ache,   fever, chills, sweats, unintended wt loss or wt gain, classically pleuritic or exertional cp,  orthopnea pnd or arm/hand swelling  or leg swelling, presyncope, palpitations, abdominal pain, anorexia, nausea, vomiting, diarrhea  or change in bowel habits or change in bladder habits, change in stools or change in urine, dysuria, hematuria,  rash, arthralgias, visual complaints, headache, numbness, weakness or ataxia or problems with walking or coordination,  change in mood or  memory.        Current Meds - - NOTE:   Unable to verify as accurately reflecting what pt takes     Medication Sig  . atorvastatin (LIPITOR) 40 MG tablet Take 40 mg by mouth daily at 6 PM.   . carvedilol (COREG) 25 MG tablet Take 25 mg by mouth 2 (two) times daily with a meal.   . dexamethasone (DECADRON) 6 MG tablet X 2 doses remaining starting 8/27  . polyethylene glycol (MIRALAX / GLYCOLAX) 17 g packet Take 17 g by mouth daily.  . tamsulosin (FLOMAX) 0.4 MG CAPS capsule Take 0.4 mg by mouth daily.  . vitamin C (VITAMIN C) 500 MG tablet Take 1 tablet (500 mg total) by mouth daily.  [name] Kitchen zinc sulfate 220 (50 Zn) MG capsule Take 1 capsule (220 mg total) by mouth daily.  .  [DISCONTINUED] fluticasone (FLONASE) 50 MCG/ACT nasal spray Place 2 sprays into the nose daily as needed for allergies.              PMHx: Hypertension Hyperlipidemia R Pleural effusion     - R tcentesis x 800 cc 10/31/2010  Exudative Lymph> segs, benign inflammatory changes on cyt     - EBUS and biopsy as well as right VATS and PleuX placement on Nov 16, 2010>  NCG with all studies neg o/w       Soc Hx: Never smoked, no heavy industry  Fm Hx  HBP/  Dm mother ? Colon problem in father 5 total siblings all healthy        Objective:   Physical Exam     amb mod hoarse bm nad     Wt 199 10/31/2010  >221 05/13/11 >   10/16/2011  213 > 01/14/2012  211> 04/16/2012 207> 03/08/2019  187   Vital signs reviewed - Note on arrival 02 sats  95% on RA  HEENT: nl dentition, turbinates bilaterally, and oropharynx. Nl external ear canals without cough reflex   NECK :  without JVD/Nodes/TM/ nl carotid upstrokes bilaterally   LUNGS: no acc muscle use,  Nl contour chest with minimal decreased bs/ dullness R base  without cough on insp or exp maneuvers   CV:  RRR  no s3 or murmur or increase in P2, and no edema   ABD:  soft and nontender with nl inspiratory  excursion in the supine position. No bruits or organomegaly appreciated, bowel sounds nl  MS:  Nl gait/ ext warm without deformities, calf tenderness, cyanosis or clubbing No obvious joint restrictions   SKIN: warm and dry without lesions    NEURO:  alert, approp, nl sensorium with  no motor or cerebellar deficits apparent.        CXR PA and Lateral:   03/08/2019 :    I personally reviewed images and agree with radiology impression as follows:   Vascular congestion and patchy right-sided infiltrate. My impression:  There is vol loss on R but no significant recurrent effusion and the as dz is much improved vs priors.    Labs ordered/ reviewed:      Chemistry      Component Value Date/Time   NA 139 03/08/2019 1524   K  4.9 03/08/2019 1524   CL 103 03/08/2019 1524   CO2 28 03/08/2019 1524   BUN 13 03/08/2019 1524   CREATININE 0.86 03/08/2019 1524      Component Value Date/Time   CALCIUM 9.4 03/08/2019 1524   ALKPHOS 53 02/14/2019 0522   AST 15 02/14/2019 0522   ALT 17 02/14/2019 0522   BILITOT 0.4 02/14/2019 0522        Lab Results  Component Value Date   WBC 4.6 03/08/2019   HGB 9.6 (L) 03/08/2019   HCT 30.2 (L) 03/08/2019   MCV 85.2 03/08/2019   PLT 422.0 (H) 03/08/2019         Lab Results  Component Value Date   TSH 1.67 03/08/2019     Lab Results  Component Value Date   PROBNP 94.0 03/08/2019       Lab Results  Component Value Date   ESRSEDRATE 85 (H) 03/08/2019   ESRSEDRATE 70 (H) 02/09/2019   ESRSEDRATE 91 (H) 02/02/2019        Assessment & Plan:

## 2019-03-09 ENCOUNTER — Encounter: Payer: Self-pay | Admitting: Internal Medicine

## 2019-03-09 LAB — CBC WITH DIFFERENTIAL/PLATELET
Basophils Absolute: 0 10*3/uL (ref 0.0–0.1)
Basophils Relative: 0.6 % (ref 0.0–3.0)
Eosinophils Absolute: 0.5 10*3/uL (ref 0.0–0.7)
Eosinophils Relative: 10.7 % — ABNORMAL HIGH (ref 0.0–5.0)
HCT: 30.2 % — ABNORMAL LOW (ref 39.0–52.0)
Hemoglobin: 9.6 g/dL — ABNORMAL LOW (ref 13.0–17.0)
Lymphocytes Relative: 22.7 % (ref 12.0–46.0)
Lymphs Abs: 1.1 10*3/uL (ref 0.7–4.0)
MCHC: 31.8 g/dL (ref 30.0–36.0)
MCV: 85.2 fl (ref 78.0–100.0)
Monocytes Absolute: 0.5 10*3/uL (ref 0.1–1.0)
Monocytes Relative: 10.8 % (ref 3.0–12.0)
Neutro Abs: 2.6 10*3/uL (ref 1.4–7.7)
Neutrophils Relative %: 55.2 % (ref 43.0–77.0)
Platelets: 422 10*3/uL — ABNORMAL HIGH (ref 150.0–400.0)
RBC: 3.54 Mil/uL — ABNORMAL LOW (ref 4.22–5.81)
RDW: 18.5 % — ABNORMAL HIGH (ref 11.5–15.5)
WBC: 4.6 10*3/uL (ref 4.0–10.5)

## 2019-03-09 LAB — BASIC METABOLIC PANEL
BUN: 13 mg/dL (ref 6–23)
CO2: 28 mEq/L (ref 19–32)
Calcium: 9.4 mg/dL (ref 8.4–10.5)
Chloride: 103 mEq/L (ref 96–112)
Creatinine, Ser: 0.86 mg/dL (ref 0.40–1.50)
GFR: 106.95 mL/min (ref >60.00)
Glucose, Bld: 100 mg/dL — ABNORMAL HIGH (ref 70–99)
Potassium: 4.9 mEq/L (ref 3.5–5.1)
Sodium: 139 mEq/L (ref 135–145)

## 2019-03-09 LAB — TSH: TSH: 1.67 u[IU]/mL (ref 0.35–4.50)

## 2019-03-09 LAB — SARS-COV-2 IGG: SARS-COV-2 IgG: 59.98

## 2019-03-09 LAB — SEDIMENTATION RATE: Sed Rate: 85 mm/hr — ABNORMAL HIGH (ref 0–20)

## 2019-03-09 LAB — BRAIN NATRIURETIC PEPTIDE: Pro B Natriuretic peptide (BNP): 94 pg/mL (ref 0.0–100.0)

## 2019-03-09 NOTE — Assessment & Plan Note (Addendum)
Onset summer 2020 assoc with anemia/ GIB/ covid pna  - 67mw 03/08/2019  311m  s desats   Much improved since discharge with no longer desats and cxr improving though not back to baseline     >>> rec no change rx >>> only loose end at this point is the pos ANCA - recheck next ov >>> return in 2 weeks with all meds in hand using a trust but verify approach to confirm accurate Medication  Reconciliation The principal here is that until we are certain that the  patients are doing what we've asked, it makes no sense to ask them to do more.    I had an extended discussion with the patient reviewing all relevant studies completed to date and  lasting 25 minutes of a 40  minute post hosp f/u office visit to re-establish with me p 7 years re  severe non-specific but potentially very serious refractory respiratory symptoms of uncertain and potentially multiple  etiologies.   Each maintenance medication was reviewed in detail including most importantly the difference between maintenance and prns and under what circumstances the prns are to be triggered using an action plan format that is not reflected in the computer generated alphabetically organized AVS.    Please see AVS for specific instructions unique to this office visit that I personally wrote and verbalized to the the pt in detail and then reviewed with pt  by my nurse highlighting any changes in therapy/plan of care  recommended at today's visit.

## 2019-03-09 NOTE — Assessment & Plan Note (Addendum)
Onset of symptoms ?  2012      -Tcentesis  10/31/2010  x 800 cc ice tea >> exudative, Lymphs > segs,  Benign inflammatory changes on cytolgy    - EBUS and biopsy as well as right VATS  Nov 16, 2010 > granulomatous changes only, all cultures neg    - Prednisone started 12/18/10>>> taper to 10 mg per day 02/22/11 > reduce to 10 qod   04/09/2011 > reduce to 5 mg qod 05/13/11 > reduce to 2.5 mg qod 07/31/2011 > d/c 09/18/2011   - Cxr 04/16/2012 > marked serial improvement, no further pulmonary f/u needed  No evidence of significant recurrent R effusion/ no further steroids needed

## 2019-03-09 NOTE — Progress Notes (Signed)
Spoke with pt and notified of results per Dr. Wert. Pt verbalized understanding and denied any questions. 

## 2019-03-10 DIAGNOSIS — K2970 Gastritis, unspecified, without bleeding: Secondary | ICD-10-CM | POA: Diagnosis not present

## 2019-03-18 ENCOUNTER — Inpatient Hospital Stay: Admission: RE | Admit: 2019-03-18 | Payer: Medicare Other | Source: Ambulatory Visit

## 2019-03-22 ENCOUNTER — Ambulatory Visit: Payer: Medicare Other | Admitting: Internal Medicine

## 2019-03-22 DIAGNOSIS — K253 Acute gastric ulcer without hemorrhage or perforation: Secondary | ICD-10-CM | POA: Diagnosis not present

## 2019-03-29 DIAGNOSIS — E7800 Pure hypercholesterolemia, unspecified: Secondary | ICD-10-CM | POA: Diagnosis not present

## 2019-03-29 DIAGNOSIS — M79605 Pain in left leg: Secondary | ICD-10-CM | POA: Diagnosis not present

## 2019-04-07 ENCOUNTER — Ambulatory Visit (INDEPENDENT_AMBULATORY_CARE_PROVIDER_SITE_OTHER): Payer: Medicare Other

## 2019-04-07 ENCOUNTER — Ambulatory Visit (INDEPENDENT_AMBULATORY_CARE_PROVIDER_SITE_OTHER): Payer: Medicare Other | Admitting: Internal Medicine

## 2019-04-07 ENCOUNTER — Encounter: Payer: Self-pay | Admitting: Internal Medicine

## 2019-04-07 ENCOUNTER — Encounter: Payer: Self-pay | Admitting: *Deleted

## 2019-04-07 ENCOUNTER — Other Ambulatory Visit: Payer: Self-pay

## 2019-04-07 DIAGNOSIS — J90 Pleural effusion, not elsewhere classified: Secondary | ICD-10-CM

## 2019-04-07 DIAGNOSIS — Z23 Encounter for immunization: Secondary | ICD-10-CM | POA: Diagnosis not present

## 2019-04-07 LAB — CBC WITH DIFFERENTIAL/PLATELET
Basophils Absolute: 0 10*3/uL (ref 0.0–0.1)
Basophils Relative: 0.6 % (ref 0.0–3.0)
Eosinophils Absolute: 1.8 10*3/uL — ABNORMAL HIGH (ref 0.0–0.7)
Eosinophils Relative: 27 % — ABNORMAL HIGH (ref 0.0–5.0)
HCT: 37.2 % — ABNORMAL LOW (ref 39.0–52.0)
Hemoglobin: 12 g/dL — ABNORMAL LOW (ref 13.0–17.0)
Lymphocytes Relative: 27.7 % (ref 12.0–46.0)
Lymphs Abs: 1.8 10*3/uL (ref 0.7–4.0)
MCHC: 32.3 g/dL (ref 30.0–36.0)
MCV: 88.8 fl (ref 78.0–100.0)
Monocytes Absolute: 0.7 10*3/uL (ref 0.1–1.0)
Monocytes Relative: 11.5 % (ref 3.0–12.0)
Neutro Abs: 2.2 10*3/uL (ref 1.4–7.7)
Neutrophils Relative %: 33.2 % — ABNORMAL LOW (ref 43.0–77.0)
Platelets: 202 10*3/uL (ref 150.0–400.0)
RBC: 4.19 Mil/uL — ABNORMAL LOW (ref 4.22–5.81)
RDW: 20.8 % — ABNORMAL HIGH (ref 11.5–15.5)
WBC: 6.5 10*3/uL (ref 4.0–10.5)

## 2019-04-07 LAB — SEDIMENTATION RATE: Sed Rate: 31 mm/hr — ABNORMAL HIGH (ref 0–20)

## 2019-04-07 NOTE — Patient Instructions (Signed)
Please remember to go to the lab and x-ray department   for your tests - we will call you with the results when they are available.     Ok to return to work full time / no restrictions effective on Friday 04/09/2019   Please schedule a follow up visit in 3 months but call sooner if needed

## 2019-04-07 NOTE — Assessment & Plan Note (Addendum)
Onset of symptoms ?  2012      -Tcentesis  10/31/2010  x 800 cc ice tea >> exudative, Lymphs > segs,  Benign inflammatory changes on cytolgy    - EBUS and biopsy as well as right VATS  Nov 16, 2010 > granulomatous changes only, all cultures neg    - Prednisone started 12/18/10>>> taper to 10 mg per day 02/22/11 > reduce to 10 qod   04/09/2011 > reduce to 5 mg qod 05/13/11 > reduce to 2.5 mg qod 07/31/2011 > d/c 09/18/2011   - Cxr 04/16/2012 > marked serial improvement, no further pulmonary f/u needed  Back to baseline cxr findings, no further w/u anticipated  - no evidence of active  Or recurrent sarcoidosis and healing up well from COVID 19 Pna which does not typically cause much in terms of pleural effusion   Ok for return to work effective Apr 09 2019   F/u in 3 months

## 2019-04-07 NOTE — Progress Notes (Signed)
Subjective:    Patient ID: Larry [name], male    DOB: 07-Nov-1950   MRN: [id number]   Brief patient profile:  68  yobm  Never smoked in country since around 1990 from [place] with very atypical ? Sarcoid/ r effusion 2012  HPI 10/31/2010 Initial pulmonary office eval cc cough x 2 months and sob x sev weeks x fast walk,  Really not very productive. Large r effusion rec dx tap > improved sob for a least a week.  Non dx features of chronic exudate  11/12/2010 ov/Khaya Theissen cc sob with ex recurrent but not as severe, still dry cough.   rec vats bx  Nov 16, 2010 EBUS and biopsy as well as right VATS and PleuX  placement on  >   cultures all neg, granulmatous inflammation on pleural bx's with necrosis but neg smears/cultures to date with ID f/u planned 12/18/18   12/13/2010 ov cc worse sob and cough since pleurex removed 6/12, sob mostly with steps/inclines, ok lying flat. Cough is dry and worse supine but breath ok flat. rec Stop atenolol Start bystolic 10 mg one daily - if blood pressure too low (light headed standing) then stop the caduet until next visit As long as coughing you need to take prilosec 20 mg Take 30-60 min before first meal of the day and Pepcid 20 mg at bedtime GERD (REFLUX)   12/18/10 start prednisone 20 mg per day by ID.  01/11/11 ov/Arionna Hoggard cc cough and sob better and able to do more including steps on 20 mg per day but not back to baseline yet rec Reduce prednisone to 20 mg tablet   Take one half daily once you are better to your satisfaction. We always try to reduce the prednisone to the lowest possible dose.   02/22/2011 f/u ov/Kyla Duffy cc all better, no cough or sob but did not follow instructions to lower prednisone dose and still on 20. rec Try prednisone 10 mg one daily ok to  increase to 20 mg per day if worsen cough or breathing   04/09/2011 f/u ov/Tristy Udovich  On pred 10 mg per day cc cough gone, no sob. Some sense of nasal obstruction and throat irritation ? Worse when stopped  using gerd rx consistently.  No rash, ocular or articular co's. No sob. rec reduce prednisone to 10 mg qod     05/13/2011 f/u ov/Nirvaan Frett cc no flare of cough or sob with taper pred to 10 mg qod - only resp complaint is nasal obst despite use of nasal steroids x months, no sinus pain or purulent nasal secretions rec Reduce prednisone to 5 mg every other day Ok to change the nexium to generic protonix (pantoprazole) 40 mg before bfast daily I emphasized that nasal steroids (flonase)  have no immediate benefit ivn". Please schedule a follow up office visit in 4 weeks, sooner if needed with cxr on return   06/14/2011 f/u ov/Burle Kwan on 5mg  qod  cc increase cough assoc with watery sinus drainage x 2 days - otherwise was doing well on qod dosing, no arthralgias/ocular complaints, rash or sob. rec If cough or breathing worse, ok to increase prednisone to 5 mg two daily until better, the one daily for a week then resume one on even days    07/31/2011 f/u ov/Porchia Sinkler weaned prednisone back to 5 mg every other day s flare of cough, minimal sinus drainage. Sob when bends only. No ocurlar or articular symptoms or rash. rec Try 5 mg prednisone one half on even  days x 6 weeks then return for cxr, office visit sooner if worse pain, breathing or cough When nose is more congested, try afrin then flonase twice daily x 5 days then stop afrin.  09/18/2011 f/u ov/Nari Vannatter cc main c/o is nasal congestion much better p afrin vs flonase alone, mostly R side with neg sinus ct noted 03/2011. No sob, cough, no problem with the 2.5 mg qod dosing of pred nor arthralgias/ rash. rec Stop the prednisone completely unless the symptoms you had before come back, then just restart the one half every day.    02/02/2019 Eval by Dr Carlis Abbott . DG Chest 2 View  . CT Chest High Resolution  . 6 minute walk  . Pulmonary function test   rec: hold off pred for now  Collagen vasc screen neg x for C-Anca Pos c titer 1:40    Date of admission:  02/09/2019             Date of discharge: 02/18/2019     Discharge Diagnoses:  Acute COVID-19 infection with GI bleed   HTN (hypertension), benign   COVID-19 virus infection   Symptomatic anemia   GI bleed   BPH (benign prostatic hyperplasia)   HLD (hyperlipidemia)      History of present illness: As per the H and P dictated on admission, "Larry Koromais a 68 y.o.malewith medical history significant ofgranulomaatous lung disease, HTN, HLD and multiple other medical problems presenting with progressive SOB and weakness.   He was seen about 1 week ago in pulmonary clinic for similar symptoms. C/o fatigue, weight loss, and SOB. He reported that his O2 sats were in the 90's at home and was concerned about a recurrent pleural effusion.   No CP. He denies fevers. He denies abdominal pain. He notes for at least the past day or so he's noticed dark red stools. He notes his son was COVID 19 positive 2-3 weeks ago.   ED Course:Labs, imaging, pRBC transfusion. Admit for GI bleed. COVID 19 positive"  Hospital Course:  Summary of his active problems in the hospital is as following. COVID 19 Pneumonia:  exposure to son 2-3 weeks pta. He was tested about 68 weeks pta  and negative.  Of note, he had increased SOB about 1 week ago and was seen in pulmonary clinic (notified MD about his positive status).  He was started on O2.  He had CXR on 8/11 concerning for multifocal opacities R>L concerning for multifocal pneumonia with small R effusion. completed remdesivir (8/19-8/21). dexa (8/19>).  weaned from oxygen.  Continue steroids on discharge  Acute Blood Loss Anemia 2/2 GI Bleed describes as maroon stool. He takes ibuprofen occasionally. at admission, Hb down to 6.9 from 12.7 in 2012 s/p 1 unit pRBC; then another 2 units  Placed on PPI gtt. Transition to PO hospitalist spoke with GI8/19/20; unless hemodynamically unstable, no scope; continue PPI,  transfusions. (02/13/19); transfused 1 unit pRBCs; reports large stool last nightno further bleeding since last 4 days. Passing gas.  no new episodes of bleeding. Hg stable.    BPHflomax  HTNcoreg  HLDcontinue lipitor  Hyperglycemia:ha1c-5.8  Constipation. Resolved prior to discharge.  Patient was ambulatory without any assistance. On the day of the discharge the patient's vitals were stable, and no other acute medical condition were reported by patient. the patient was felt safe to be discharge at Home with no therapy needed on discharge.  Consultants: gastroenterology [facility] Procedures: none  DISCHARGE MEDICATION: Allergies as of 02/18/2019   No  Known Allergies        Medication List    STOP taking these medications   acetaminophen-codeine 300-30 MG tablet Commonly known as: TYLENOL #3   ibuprofen 800 MG tablet Commonly known as: ADVIL   nebivolol 10 MG tablet Commonly known as: Bystolic     TAKE these medications   ascorbic acid 500 MG tablet Commonly known as: VITAMIN C Take 1 tablet (500 mg total) by mouth daily.   atorvastatin 40 MG tablet Commonly known as: LIPITOR Take 40 mg by mouth daily at 6 PM.   carvedilol 25 MG tablet Commonly known as: COREG Take 25 mg by mouth 2 (two) times daily with a meal.   dexamethasone 6 MG tablet Commonly known as: DECADRON Take 1 tablet (6 mg total) by mouth at bedtime.   fluticasone 50 MCG/ACT nasal spray Commonly known as: FLONASE Place 2 sprays into the nose daily as needed for allergies.   hydrocortisone 2.5 % cream Apply 1 application topically 2 (two) times daily.   polyethylene glycol 17 g packet Commonly known as: MIRALAX / GLYCOLAX Take 17 g by mouth daily.   tamsulosin 0.4 MG Caps capsule Commonly known as: FLOMAX Take 0.4 mg by mouth daily.   zinc sulfate 220 (50 Zn) MG capsule Take 1 capsule (220 mg total) by mouth daily.       03/08/2019  f/u ov/Ailea Rhatigan re:  recurrent onset sob since summer 2020  Chief Complaint  Patient presents with  . Follow-up    OV with 14mw. Patient has previously seen Dr. Carlis Abbott. States his breathing has been ok since last visit.   Dyspnea: improved = doe with  Steps or more than slow pace sob = MMRC2 = can't walk a nl pace on a flat grade s sob but does fine slow and flat  Cough: at hs nightly  then clears then recurs sporadically daytime with minimal mucoid sputum  Sleeping: flat bed two pillows better on R side down  SABA use: none  02: denies having any supplemental 02 at this point  rec Add:  Needs repeat anca next ov    04/07/2019  f/u ov/Elka Satterfield re:  Doe s/p covid pna Chief Complaint  Patient presents with  . Follow-up    Breathing is much improved since the last visit.   Dyspnea:  Improved, able to do steps now, wants to return to work 04/09/19  Cough: a few coughs in am then fine for the rest of the day / no excess mucus Sleeping: 2 pillows/ tends to sleep L side down SABA use: none  02: no    No obvious day to day or daytime variability or assoc excess/ purulent sputum or mucus plugs or hemoptysis or cp or chest tightness, subjective wheeze or overt sinus or hb symptoms.   Sleeps  without nocturnal  or early am exacerbation  of respiratory  c/o's or need for noct saba. Also denies any obvious fluctuation of symptoms with weather or environmental changes or other aggravating or alleviating factors except as outlined above   No unusual exposure hx or h/o childhood pna/ asthma or knowledge of premature birth.  Current Allergies, Complete Past Medical History, Past Surgical History, Family History, and Social History were reviewed in [employer] record.  ROS  The following are not active complaints unless bolded Hoarseness, sore throat, dysphagia, dental problems, itching, sneezing,  nasal congestion or discharge of excess mucus or purulent secretions, ear ache,   fever, chills, sweats,  unintended  wt loss or wt gain, classically pleuritic or exertional cp,  orthopnea pnd or arm/hand swelling  or leg swelling, presyncope, palpitations, abdominal pain, anorexia, nausea, vomiting, diarrhea  or change in bowel habits or change in bladder habits, change in stools or change in urine, dysuria, hematuria,  rash, arthralgias, visual complaints, headache, numbness, weakness or ataxia or problems with walking or coordination,  change in mood or  memory.        Current Meds  Medication Sig  . atorvastatin (LIPITOR) 40 MG tablet Take 40 mg by mouth daily at 6 PM.   . carvedilol (COREG) 25 MG tablet Take 25 mg by mouth 2 (two) times daily with a meal.   . [employer] IRON 325 (65 Fe) MG tablet Take 1 tablet by mouth 2 (two) times daily.  . fluticasone (FLONASE) 50 MCG/ACT nasal spray Place 2 sprays into both nostrils daily.  . pantoprazole (PROTONIX) 40 MG tablet Take 40 mg by mouth daily.  . tamsulosin (FLOMAX) 0.4 MG CAPS capsule Take 0.4 mg by mouth daily.                  PMHx: Hypertension Hyperlipidemia R Pleural effusion     - R tcentesis x 800 cc 10/31/2010  Exudative Lymph> segs, benign inflammatory changes on cyt     - EBUS and biopsy as well as right VATS and PleuX placement on Nov 16, 2010>  NCG with all studies neg o/w       Soc Hx: Never smoked, no heavy industry  Fm Hx  HBP/  Dm mother ? Colon problem in father 5 total siblings all healthy        Objective:   Physical Exam      amb bm nad   Wt Readings from Last 3 Encounters:  04/07/19 201 lb (91.2 kg)  03/08/19 187 lb (84.8 kg)  02/18/19 175 lb 6.4 oz (79.6 kg)     Vital signs reviewed - Note on arrival 02 sats  100% on RA           HEENT : pt wearing mask not removed for exam due to covid -19 concerns.    NECK :  without JVD/Nodes/TM/ nl carotid upstrokes bilaterally   LUNGS: no acc muscle use,  Nl contour chest with minimal decreased bs/ dullness R base  without cough on insp or exp  maneuvers   CV:  RRR  no s3 or murmur or increase in P2, and no edema   ABD:  soft and nontender with nl inspiratory excursion in the supine position. No bruits or organomegaly appreciated, bowel sounds nl  MS:  Nl gait/ ext warm without deformities, calf tenderness, cyanosis or clubbing No obvious joint restrictions   SKIN: warm and dry without lesions    NEURO:  alert, approp, nl sensorium with  no motor or cerebellar deficits apparent.     CXR PA and Lateral:   04/07/2019 :    I personally reviewed images and impression as follows:   Back to baseline findings R base      Labs ordered 04/07/2019  :  ANCA      Lab Results  Component Value Date   ESRSEDRATE 31 (H) 04/07/2019   ESRSEDRATE 85 (H) 03/08/2019   ESRSEDRATE 70 (H) 02/09/2019       Lab Results  Component Value Date   HGB 12.0 (L) 04/07/2019   HGB 9.6 (L) 03/08/2019   HGB 9.2 (L) 02/17/2019  Assessment & Plan:

## 2019-04-08 NOTE — Progress Notes (Signed)
Spoke with pt and notified of results per Dr. Wert. Pt verbalized understanding and denied any questions. 

## 2019-04-09 LAB — ANCA SCREEN W REFLEX TITER: ANCA Screen: NEGATIVE

## 2019-04-13 DIAGNOSIS — M545 Low back pain: Secondary | ICD-10-CM | POA: Diagnosis not present

## 2019-04-13 DIAGNOSIS — M5441 Lumbago with sciatica, right side: Secondary | ICD-10-CM | POA: Diagnosis not present

## 2019-05-11 ENCOUNTER — Telehealth: Payer: Self-pay | Admitting: Internal Medicine

## 2019-05-11 NOTE — Telephone Encounter (Signed)
Rec'd disability paperwork back via interoffice mail from [employer] stating they did not process this paperwork. Dr.Wert must have filled out these forms for the patient in the office - faxed forms as well as ov notes requested to [employer], sent paperwork to be scanned -pr

## 2019-05-28 DIAGNOSIS — Z20828 Contact with and (suspected) exposure to other viral communicable diseases: Secondary | ICD-10-CM | POA: Diagnosis not present

## 2019-05-28 DIAGNOSIS — Z6827 Body mass index (BMI) 27.0-27.9, adult: Secondary | ICD-10-CM | POA: Diagnosis not present

## 2019-05-31 DIAGNOSIS — Z01818 Encounter for other preprocedural examination: Secondary | ICD-10-CM | POA: Diagnosis not present

## 2019-05-31 DIAGNOSIS — K253 Acute gastric ulcer without hemorrhage or perforation: Secondary | ICD-10-CM | POA: Diagnosis not present

## 2019-07-08 ENCOUNTER — Ambulatory Visit: Payer: Medicare Other | Admitting: Internal Medicine

## 2019-07-16 ENCOUNTER — Ambulatory Visit (INDEPENDENT_AMBULATORY_CARE_PROVIDER_SITE_OTHER): Payer: Medicare Other

## 2019-07-16 ENCOUNTER — Ambulatory Visit: Payer: Medicare Other | Admitting: Internal Medicine

## 2019-07-16 ENCOUNTER — Other Ambulatory Visit: Payer: Self-pay

## 2019-07-16 ENCOUNTER — Encounter: Payer: Self-pay | Admitting: Internal Medicine

## 2019-07-16 DIAGNOSIS — J90 Pleural effusion, not elsewhere classified: Secondary | ICD-10-CM

## 2019-07-16 NOTE — Progress Notes (Signed)
Subjective:    Patient ID: Larry [name], male    DOB: 07-Nov-1950   MRN: [id number]   Brief patient profile:  69  yobm  Never smoked in country since around 1990 from [place] with very atypical ? Sarcoid/ r effusion 2012  HPI 10/31/2010 Initial pulmonary office eval cc cough x 2 months and sob x sev weeks x fast walk,  Really not very productive. Large r effusion rec dx tap > improved sob for a least a week.  Non dx features of chronic exudate  11/12/2010 ov/Lakeithia Rasor cc sob with ex recurrent but not as severe, still dry cough.   rec vats bx  Nov 16, 2010 EBUS and biopsy as well as right VATS and PleuX  placement on  >   cultures all neg, granulmatous inflammation on pleural bx's with necrosis but neg smears/cultures to date with ID f/u planned 12/18/18   12/13/2010 ov cc worse sob and cough since pleurex removed 6/12, sob mostly with steps/inclines, ok lying flat. Cough is dry and worse supine but breath ok flat. rec Stop atenolol Start bystolic 10 mg one daily - if blood pressure too low (light headed standing) then stop the caduet until next visit As long as coughing you need to take prilosec 20 mg Take 30-60 min before first meal of the day and Pepcid 20 mg at bedtime GERD (REFLUX)   12/18/10 start prednisone 20 mg per day by ID.  01/11/11 ov/Jasmina Gendron cc cough and sob better and able to do more including steps on 20 mg per day but not back to baseline yet rec Reduce prednisone to 20 mg tablet   Take one half daily once you are better to your satisfaction. We always try to reduce the prednisone to the lowest possible dose.   02/22/2011 f/u ov/Marcille Barman cc all better, no cough or sob but did not follow instructions to lower prednisone dose and still on 20. rec Try prednisone 10 mg one daily ok to  increase to 20 mg per day if worsen cough or breathing   04/09/2011 f/u ov/Vivika Poythress  On pred 10 mg per day cc cough gone, no sob. Some sense of nasal obstruction and throat irritation ? Worse when stopped  using gerd rx consistently.  No rash, ocular or articular co's. No sob. rec reduce prednisone to 10 mg qod     05/13/2011 f/u ov/Yifan Auker cc no flare of cough or sob with taper pred to 10 mg qod - only resp complaint is nasal obst despite use of nasal steroids x months, no sinus pain or purulent nasal secretions rec Reduce prednisone to 5 mg every other day Ok to change the nexium to generic protonix (pantoprazole) 40 mg before bfast daily I emphasized that nasal steroids (flonase)  have no immediate benefit ivn". Please schedule a follow up office visit in 4 weeks, sooner if needed with cxr on return   06/14/2011 f/u ov/Diamonte Stavely on 5mg  qod  cc increase cough assoc with watery sinus drainage x 2 days - otherwise was doing well on qod dosing, no arthralgias/ocular complaints, rash or sob. rec If cough or breathing worse, ok to increase prednisone to 5 mg two daily until better, the one daily for a week then resume one on even days    07/31/2011 f/u ov/Taheem Fricke weaned prednisone back to 5 mg every other day s flare of cough, minimal sinus drainage. Sob when bends only. No ocurlar or articular symptoms or rash. rec Try 5 mg prednisone one half on even  days x 6 weeks then return for cxr, office visit sooner if worse pain, breathing or cough When nose is more congested, try afrin then flonase twice daily x 5 days then stop afrin.  09/18/2011 f/u ov/Yarethzi Branan cc main c/o is nasal congestion much better p afrin vs flonase alone, mostly R side with neg sinus ct noted 03/2011. No sob, cough, no problem with the 2.5 mg qod dosing of pred nor arthralgias/ rash. rec Stop the prednisone completely unless the symptoms you had before come back, then just restart the one half every day.    02/02/2019 Eval by Dr Carlis Abbott . DG Chest 2 View  . CT Chest High Resolution  . 6 minute walk  . Pulmonary function test   rec: hold off pred for now  Collagen vasc screen neg x for C-Anca Pos c titer 1:40    Date of admission:  02/09/2019             Date of discharge: 02/18/2019     Discharge Diagnoses:  Acute COVID-19 infection with GI bleed   HTN (hypertension), benign   COVID-19 virus infection   Symptomatic anemia   GI bleed   BPH (benign prostatic hyperplasia)   HLD (hyperlipidemia)      History of present illness: As per the H and P dictated on admission, "Larry [name] a 69 y.o.malewith medical history significant ofgranulomaatous lung disease, HTN, HLD and multiple other medical problems presenting with progressive SOB and weakness.   He was seen about 1 week ago in pulmonary clinic for similar symptoms. C/o fatigue, weight loss, and SOB. He reported that his O2 sats were in the 90's at home and was concerned about a recurrent pleural effusion.   No CP. He denies fevers. He denies abdominal pain. He notes for at least the past day or so he's noticed dark red stools. He notes his son was COVID 19 positive 2-3 weeks ago.   ED Course:Labs, imaging, pRBC transfusion. Admit for GI bleed. COVID 19 positive"  Hospital Course:  Summary of his active problems in the hospital is as following. COVID 19 Pneumonia:  exposure to son 2-3 weeks pta. He was tested about 2 weeks pta  and negative.  Of note, he had increased SOB about 1 week ago and was seen in pulmonary clinic (notified MD about his positive status).  He was started on O2.  He had CXR on 8/11 concerning for multifocal opacities R>L concerning for multifocal pneumonia with small R effusion. completed remdesivir (8/19-8/21). dexa (8/19>).  weaned from oxygen.  Continue steroids on discharge  Acute Blood Loss Anemia 2/2 GI Bleed describes as maroon stool. He takes ibuprofen occasionally. at admission, Hb down to 6.9 from 12.7 in 2012 s/p 1 unit pRBC; then another 2 units  Placed on PPI gtt. Transition to PO hospitalist spoke with GI8/19/20; unless hemodynamically unstable, no scope; continue PPI,  transfusions. (02/13/19); transfused 1 unit pRBCs; reports large stool last nightno further bleeding since last 4 days. Passing gas.  no new episodes of bleeding. Hg stable.    BPHflomax  HTNcoreg  HLDcontinue lipitor  Hyperglycemia:ha1c-5.8  Constipation. Resolved prior to discharge.  Patient was ambulatory without any assistance. On the day of the discharge the patient's vitals were stable, and no other acute medical condition were reported by patient. the patient was felt safe to be discharge at Home with no therapy needed on discharge.  Consultants: gastroenterology [facility] Procedures: none  DISCHARGE MEDICATION: Allergies as of 02/18/2019   No  Known Allergies        Medication List    STOP taking these medications   acetaminophen-codeine 300-30 MG tablet Commonly known as: TYLENOL #3   ibuprofen 800 MG tablet Commonly known as: ADVIL   nebivolol 10 MG tablet Commonly known as: Bystolic     TAKE these medications   ascorbic acid 500 MG tablet Commonly known as: VITAMIN C Take 1 tablet (500 mg total) by mouth daily.   atorvastatin 40 MG tablet Commonly known as: LIPITOR Take 40 mg by mouth daily at 6 PM.   carvedilol 25 MG tablet Commonly known as: COREG Take 25 mg by mouth 2 (two) times daily with a meal.   dexamethasone 6 MG tablet Commonly known as: DECADRON Take 1 tablet (6 mg total) by mouth at bedtime.   fluticasone 50 MCG/ACT nasal spray Commonly known as: FLONASE Place 2 sprays into the nose daily as needed for allergies.   hydrocortisone 2.5 % cream Apply 1 application topically 2 (two) times daily.   polyethylene glycol 17 g packet Commonly known as: MIRALAX / GLYCOLAX Take 17 g by mouth daily.   tamsulosin 0.4 MG Caps capsule Commonly known as: FLOMAX Take 0.4 mg by mouth daily.   zinc sulfate 220 (50 Zn) MG capsule Take 1 capsule (220 mg total) by mouth daily.       03/08/2019  f/u ov/Francisca Harbuck re:  recurrent onset sob since summer 2020  Chief Complaint  Patient presents with  . Follow-up    OV with 62mw. Patient has previously seen Dr. Carlis Abbott. States his breathing has been ok since last visit.   Dyspnea: improved = doe with  Steps or more than slow pace sob = MMRC2 = can't walk a nl pace on a flat grade s sob but does fine slow and flat  Cough: at hs nightly  then clears then recurs sporadically daytime with minimal mucoid sputum  Sleeping: flat bed two pillows better on R side down  SABA use: none  02: denies having any supplemental 02 at this point  rec F/u cxr    04/07/2019  f/u ov/Anniemae Haberkorn re:  Doe s/p covid pna Chief Complaint  Patient presents with  . Follow-up    Breathing is much improved since the last visit.   Dyspnea:  Improved, able to do steps now, wants to return to work 04/09/19  Cough: a few coughs in am then fine for the rest of the day / no excess mucus Sleeping: 2 pillows/ tends to sleep L side down SABA use: none  02: no rec No change rx > f/u final cxr in 3 m    07/16/2019  f/u ov/Kellan Boehlke re: chronic R effusion/ s/p covid  Chief Complaint  Patient presents with  . Follow-up    Breathing is doing well and no new co's today.    Dyspnea:  Steps ok / moving all day at work Cough: minimal/ non productive  Sleeping: sleeps on side L side down / 2 pillows SABA use: none 02: none    No obvious day to day or daytime variability or assoc excess/ purulent sputum or mucus plugs or hemoptysis or cp or chest tightness, subjective wheeze or overt sinus or hb symptoms.   Sleeping as above without nocturnal  or early am exacerbation  of respiratory  c/o's or need for noct saba. Also denies any obvious fluctuation of symptoms with weather or environmental changes or other aggravating or alleviating factors except as outlined above   No  unusual exposure hx or h/o childhood pna/ asthma or knowledge of premature birth.  Current Allergies, Complete Past Medical History, Past  Surgical History, Family History, and Social History were reviewed in [employer] record.  ROS  The following are not active complaints unless bolded Hoarseness, sore throat, dysphagia, dental problems, itching, sneezing,  nasal congestion or discharge of excess mucus or purulent secretions, ear ache,   fever, chills, sweats, unintended wt loss or wt gain, classically pleuritic or exertional cp,  orthopnea pnd or arm/hand swelling  or leg swelling, presyncope, palpitations, abdominal pain, anorexia, nausea, vomiting, diarrhea  or change in bowel habits or change in bladder habits, change in stools or change in urine, dysuria, hematuria,  rash, arthralgias, visual complaints, headache, numbness, weakness or ataxia or problems with walking or coordination,  change in mood or  memory.        Current Meds  Medication Sig  . atorvastatin (LIPITOR) 40 MG tablet Take 40 mg by mouth daily at 6 PM.   . carvedilol (COREG) 25 MG tablet Take 25 mg by mouth 2 (two) times daily with a meal.   . [employer] IRON 325 (65 Fe) MG tablet Take 1 tablet by mouth 2 (two) times daily.  . fluticasone (FLONASE) 50 MCG/ACT nasal spray Place 2 sprays into both nostrils daily.  . pantoprazole (PROTONIX) 40 MG tablet Take 40 mg by mouth daily.  . tamsulosin (FLOMAX) 0.4 MG CAPS capsule Take 0.4 mg by mouth daily.                    PMHx: Hypertension Hyperlipidemia R Pleural effusion     - R tcentesis x 800 cc 10/31/2010  Exudative Lymph> segs, benign inflammatory changes on cyt     - EBUS and biopsy as well as right VATS and PleuX placement on Nov 16, 2010>  NCG with all studies neg o/w       Soc Hx: Never smoked, no heavy industry  Fm Hx  HBP/  Dm mother ? Colon problem in father 5 total siblings all healthy        Objective:   Physical Exam     amb bm nad   07/16/2019        209  04/07/19 201 lb (91.2 kg)  03/08/19 187 lb (84.8 kg)  02/18/19 175 lb 6.4 oz (79.6 kg)    Vital  signs reviewed  07/16/2019  - Note at rest 02 sats  98% on RA     HEENT : pt wearing mask not removed for exam due to covid -19 concerns.    NECK :  without JVD/Nodes/TM/ nl carotid upstrokes bilaterally   LUNGS: no acc muscle use,  Nl contour chest which is clear to A and P bilaterally without cough on insp or exp maneuvers   CV:  RRR  no s3 or murmur or increase in P2, and no edema   ABD:  soft and nontender with nl inspiratory excursion in the supine position. No bruits or organomegaly appreciated, bowel sounds nl  MS:  Nl gait/ ext warm without deformities, calf tenderness, cyanosis or clubbing No obvious joint restrictions   SKIN: warm and dry without lesions    NEURO:  alert, approp, nl sensorium with  no motor or cerebellar deficits apparent.       CXR PA and Lateral:   07/16/2019 :    I personally reviewed images and agree with radiology impression as follows:   No marked change  in a small right pleural effusion and basilar atelectasis.      Assessment & Plan:

## 2019-07-16 NOTE — Patient Instructions (Signed)
Please remember to go to the  x-ray department  for your tests - we will call you with the results when they are available      If you are satisfied with your treatment plan,  let your doctor know and he/she can either refill your medications or you can return here when your prescription runs out.     If in any way you are not 100% satisfied,  please tell us.  If 100% better, tell your friends!  Pulmonary follow up is as needed

## 2019-07-17 ENCOUNTER — Encounter: Payer: Self-pay | Admitting: Internal Medicine

## 2019-07-17 NOTE — Assessment & Plan Note (Signed)
Onset of symptoms ?  2012      -Tcentesis  10/31/2010  x 800 cc ice tea >> exudative, Lymphs > segs,  Benign inflammatory changes on cytolgy    - EBUS and biopsy as well as right VATS  Nov 16, 2010 > granulomatous changes only, all cultures neg    - Prednisone started 12/18/10>>> taper to 10 mg per day 02/22/11 > reduce to 10 qod   04/09/2011 > reduce to 5 mg qod 05/13/11 > reduce to 2.5 mg qod 07/31/2011 > d/c 09/18/2011   - Cxr 04/16/2012 > marked serial improvement, no further pulmonary f/u needed -  Final post covid 19 cxr 07/16/2019 > no change chronic R effusion/atx  Back to baseline clinically and radiographically so no further pulmonary f/u is needed

## 2019-07-21 ENCOUNTER — Telehealth: Payer: Self-pay | Admitting: Internal Medicine

## 2019-07-21 NOTE — Telephone Encounter (Signed)
Larry [name], CMA  07/16/2019 1:29 PM EST    LMTCB   Tanda Rockers, MD  07/16/2019 12:33 PM EST    Call pt: Reviewed cxr and no acute change so no change in recommendations made at ov  -------------------------------------------------------------- LMTCB x1 for pt.

## 2019-07-22 NOTE — Telephone Encounter (Signed)
lmtcb for pt.  

## 2019-07-23 NOTE — Telephone Encounter (Signed)
ATC Patient.  LM for Patient to call office for cxr results.

## 2019-07-26 NOTE — Telephone Encounter (Signed)
ATC patient unable to reach LM to call back office (x1)  

## 2019-07-26 NOTE — Telephone Encounter (Signed)
Pt is returning call regarding messge left for him. 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

## 2019-07-27 NOTE — Telephone Encounter (Signed)
Spoke with pt. He is aware of results. Nothing further was needed. 

## 2019-12-30 DIAGNOSIS — E7800 Pure hypercholesterolemia, unspecified: Secondary | ICD-10-CM | POA: Diagnosis not present

## 2019-12-30 DIAGNOSIS — D500 Iron deficiency anemia secondary to blood loss (chronic): Secondary | ICD-10-CM | POA: Diagnosis not present

## 2019-12-30 DIAGNOSIS — Z0000 Encounter for general adult medical examination without abnormal findings: Secondary | ICD-10-CM | POA: Diagnosis not present

## 2019-12-30 DIAGNOSIS — R3911 Hesitancy of micturition: Secondary | ICD-10-CM | POA: Diagnosis not present

## 2019-12-30 DIAGNOSIS — Z1159 Encounter for screening for other viral diseases: Secondary | ICD-10-CM | POA: Diagnosis not present

## 2019-12-30 DIAGNOSIS — I10 Essential (primary) hypertension: Secondary | ICD-10-CM | POA: Diagnosis not present

## 2020-05-10 DIAGNOSIS — I10 Essential (primary) hypertension: Secondary | ICD-10-CM | POA: Diagnosis not present

## 2020-05-10 DIAGNOSIS — E7800 Pure hypercholesterolemia, unspecified: Secondary | ICD-10-CM | POA: Diagnosis not present

## 2020-05-10 DIAGNOSIS — D500 Iron deficiency anemia secondary to blood loss (chronic): Secondary | ICD-10-CM | POA: Diagnosis not present

## 2020-08-29 DIAGNOSIS — D500 Iron deficiency anemia secondary to blood loss (chronic): Secondary | ICD-10-CM | POA: Diagnosis not present

## 2020-08-29 DIAGNOSIS — E7800 Pure hypercholesterolemia, unspecified: Secondary | ICD-10-CM | POA: Diagnosis not present

## 2020-08-29 DIAGNOSIS — I10 Essential (primary) hypertension: Secondary | ICD-10-CM | POA: Diagnosis not present

## 2020-10-10 IMAGING — DX DG CHEST 2V
2 series · 2 of 2 positions shown · non-contrast
Comparison: PA and lateral chest 04/07/2019 and 03/08/2019.

CLINICAL DATA: [id number] patient. History of a right pleural
effusion.

EXAM:
CHEST - 2 VIEW

[chest pa]
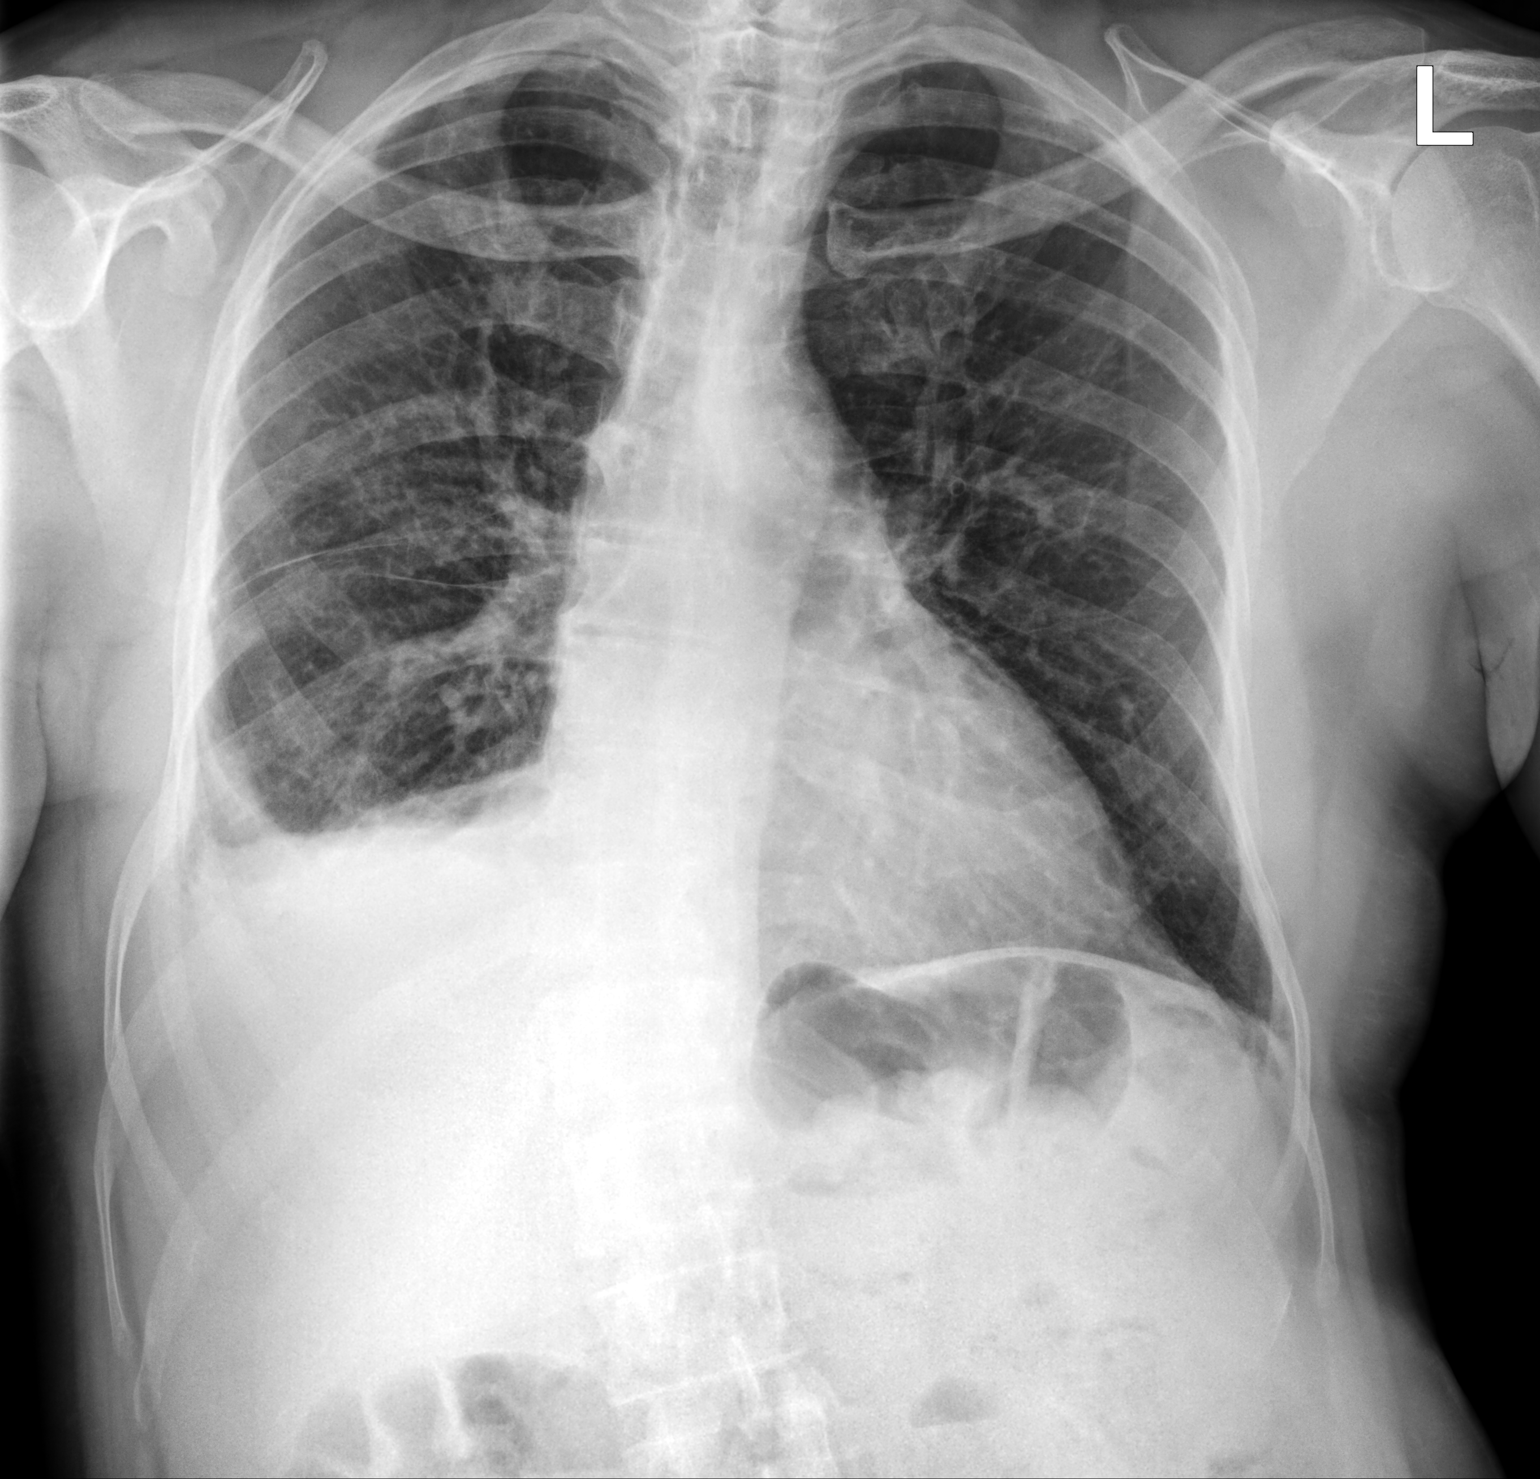

[chest lat]
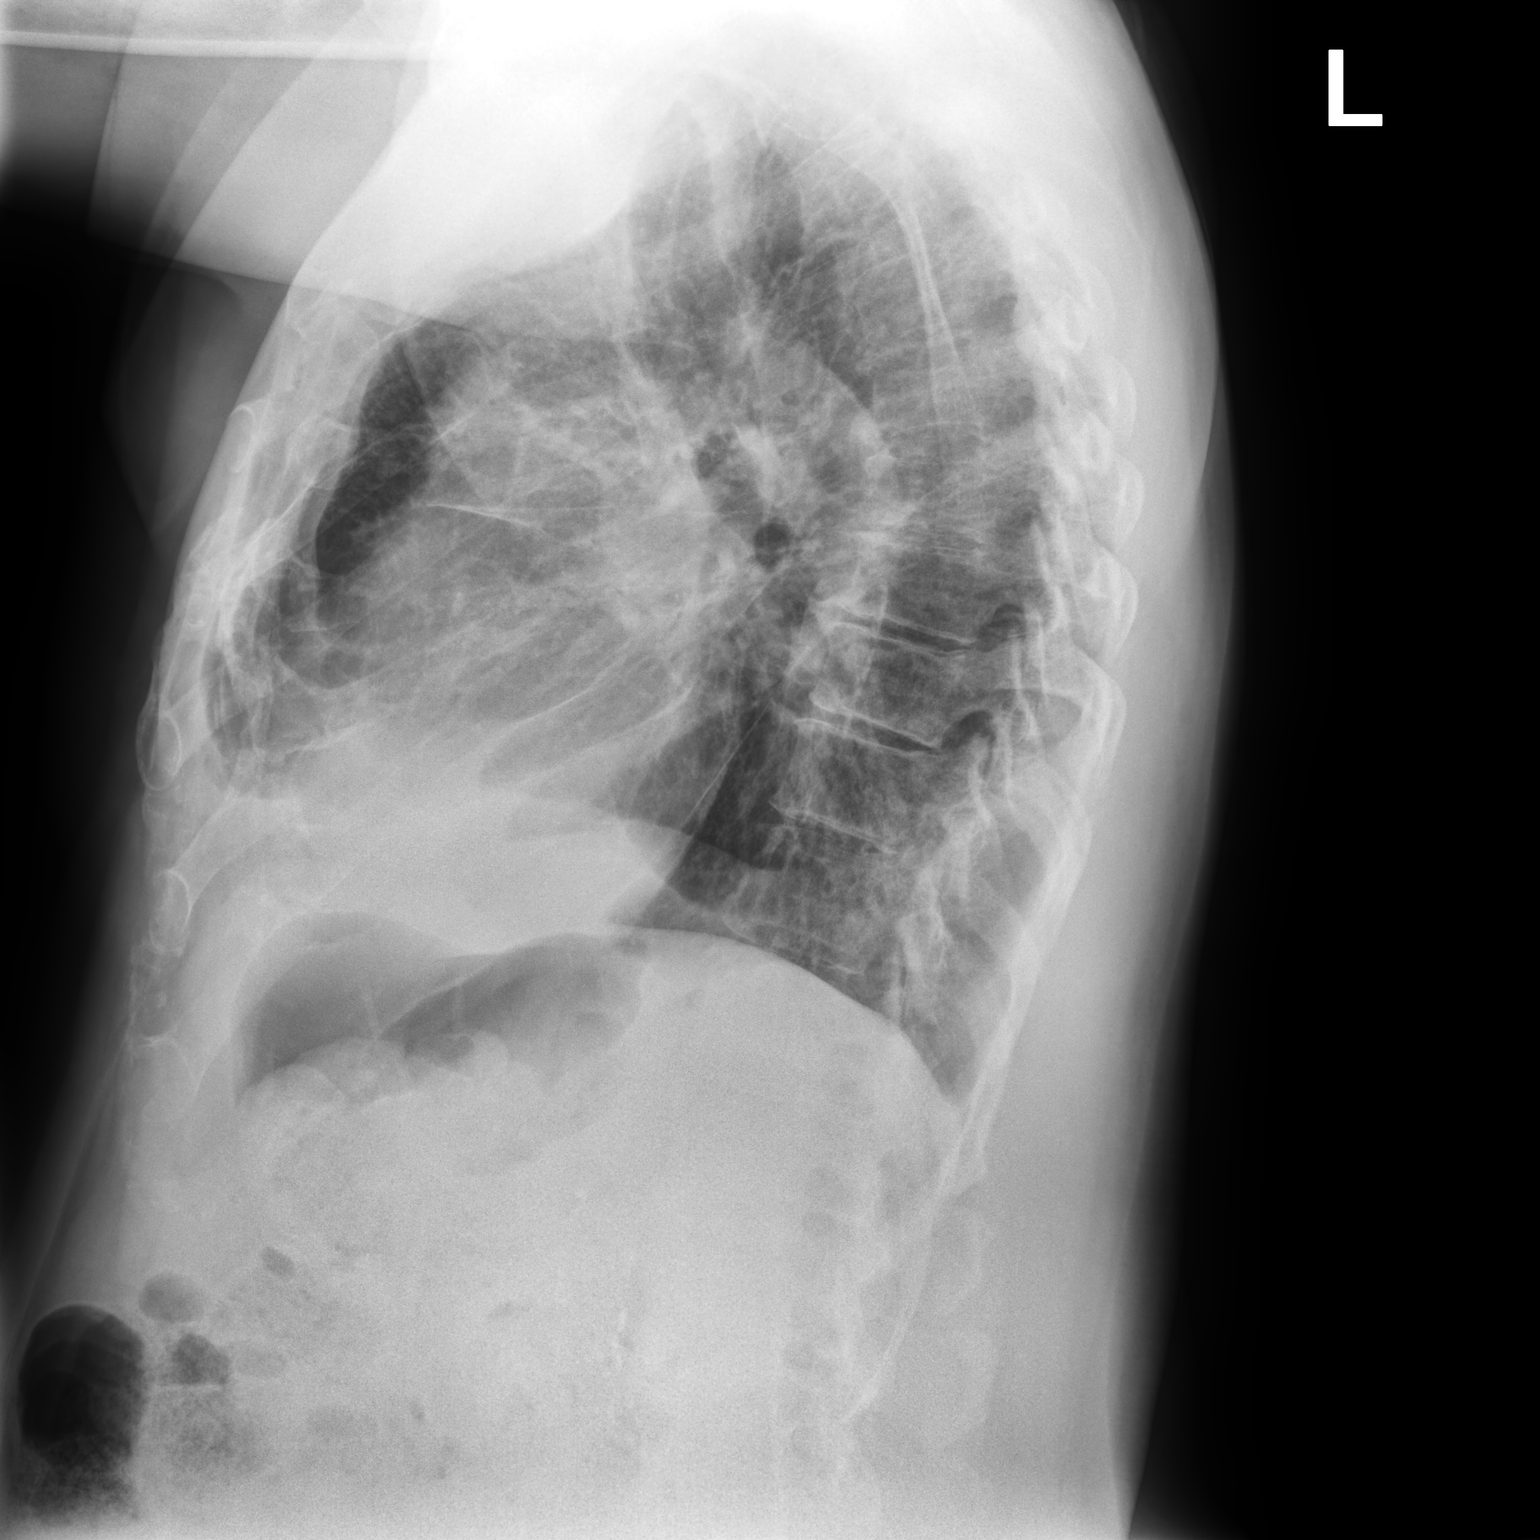

[2 of 2 positions shown; findings below may reference images not displayed]

FINDINGS: Small right pleural effusion and basilar atelectasis are unchanged.
The left lung is clear. Heart size is normal. No pneumothorax. No
acute bony abnormality. Scoliosis noted.
IMPRESSION: No marked change in a small right pleural effusion and basilar
atelectasis.

## 2020-11-14 DIAGNOSIS — I10 Essential (primary) hypertension: Secondary | ICD-10-CM | POA: Diagnosis not present

## 2020-11-14 DIAGNOSIS — D500 Iron deficiency anemia secondary to blood loss (chronic): Secondary | ICD-10-CM | POA: Diagnosis not present

## 2020-11-14 DIAGNOSIS — E7800 Pure hypercholesterolemia, unspecified: Secondary | ICD-10-CM | POA: Diagnosis not present

## 2020-11-29 DIAGNOSIS — E7800 Pure hypercholesterolemia, unspecified: Secondary | ICD-10-CM | POA: Diagnosis not present

## 2020-11-29 DIAGNOSIS — D500 Iron deficiency anemia secondary to blood loss (chronic): Secondary | ICD-10-CM | POA: Diagnosis not present

## 2020-11-29 DIAGNOSIS — I10 Essential (primary) hypertension: Secondary | ICD-10-CM | POA: Diagnosis not present

## 2021-01-10 DIAGNOSIS — M25559 Pain in unspecified hip: Secondary | ICD-10-CM | POA: Diagnosis not present

## 2021-01-10 DIAGNOSIS — E7800 Pure hypercholesterolemia, unspecified: Secondary | ICD-10-CM | POA: Diagnosis not present

## 2021-01-10 DIAGNOSIS — Z23 Encounter for immunization: Secondary | ICD-10-CM | POA: Diagnosis not present

## 2021-01-10 DIAGNOSIS — R3911 Hesitancy of micturition: Secondary | ICD-10-CM | POA: Diagnosis not present

## 2021-01-10 DIAGNOSIS — D500 Iron deficiency anemia secondary to blood loss (chronic): Secondary | ICD-10-CM | POA: Diagnosis not present

## 2021-01-10 DIAGNOSIS — Z862 Personal history of diseases of the blood and blood-forming organs and certain disorders involving the immune mechanism: Secondary | ICD-10-CM | POA: Diagnosis not present

## 2021-01-10 DIAGNOSIS — I10 Essential (primary) hypertension: Secondary | ICD-10-CM | POA: Diagnosis not present

## 2021-01-10 DIAGNOSIS — Z0000 Encounter for general adult medical examination without abnormal findings: Secondary | ICD-10-CM | POA: Diagnosis not present

## 2021-01-10 DIAGNOSIS — L989 Disorder of the skin and subcutaneous tissue, unspecified: Secondary | ICD-10-CM | POA: Diagnosis not present

## 2021-01-16 ENCOUNTER — Ambulatory Visit (INDEPENDENT_AMBULATORY_CARE_PROVIDER_SITE_OTHER): Payer: Medicare Other

## 2021-01-16 ENCOUNTER — Encounter: Payer: Self-pay | Admitting: Internal Medicine

## 2021-01-16 ENCOUNTER — Ambulatory Visit: Payer: Medicare Other | Admitting: Internal Medicine

## 2021-01-16 ENCOUNTER — Other Ambulatory Visit: Payer: Self-pay

## 2021-01-16 DIAGNOSIS — J90 Pleural effusion, not elsewhere classified: Secondary | ICD-10-CM

## 2021-01-16 DIAGNOSIS — R059 Cough, unspecified: Secondary | ICD-10-CM | POA: Diagnosis not present

## 2021-01-16 DIAGNOSIS — R918 Other nonspecific abnormal finding of lung field: Secondary | ICD-10-CM | POA: Diagnosis not present

## 2021-01-16 NOTE — Assessment & Plan Note (Signed)
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  While not intuitively obvious, many patients with chronic low grade reflux do not cough until there is a primary insult that disturbs the protective epithelial barrier and exposes sensitive nerve endings.   This is typically viral but can due to PNDS and  Former more likely here.    >>>  The point is that once this occurs, it is difficult to eliminate the cycle  using anything but a maximally effective acid suppression regimen at least in the short run, accompanied by an appropriate diet to address non acid GERD and control / eliminate the cough itself with mucinex dm/ hard rock candies to reduce reduce the throat clearing, and pulmonary f/u p 2 weeks if not responding.         Each maintenance medication was reviewed in detail including emphasizing most importantly the difference between maintenance and prns and under what circumstances the prns are to be triggered using an action plan format where appropriate.  Total time for H and P, chart review, counseling, reviewing  and generating customized AVS unique to this office visit / same day charting = 28 min

## 2021-01-16 NOTE — Assessment & Plan Note (Signed)
Onset of symptoms ?  2012      -Tcentesis  10/31/2010  x 800 cc ice tea >> exudative, Lymphs > segs,  Benign inflammatory changes on cytolgy    - EBUS and biopsy as well as right VATS  Nov 16, 2010 > granulomatous changes only, all cultures neg    - Prednisone started 12/18/10>>> taper to 10 mg per day 02/22/11 > reduce to 10 qod   04/09/2011 > reduce to 5 mg qod 05/13/11 > reduce to 2.5 mg qod 07/31/2011 > d/c 09/18/2011   - Cxr 04/16/2012 > marked serial improvement, no further pulmonary f/u needed -  Final post covid 19 cxr 07/16/2019 > no change chronic R effusion/atx - no change 01/16/2021       No need for further w/u at this point

## 2021-01-16 NOTE — Progress Notes (Signed)
Subjective:    Patient ID: Larry [name], male    DOB: November 28, 1950   MRN: [id number]   Brief patient profile:  70  yobm  Never smoked in country since around 1990 from [place] with very atypical ? Sarcoid/ r effusion 2012  HPI 10/31/2010 Initial pulmonary office eval cc cough x 2 months and sob x sev weeks x fast walk,  Really not very productive. Large r effusion rec dx tap > improved sob for a least a week.  Non dx features of chronic exudate  11/12/2010 ov/Larry [name] cc sob with ex recurrent but not as severe, still dry cough.   rec vats bx  Nov 16, 2010 EBUS and biopsy as well as right VATS and PleuX  placement on  >   cultures all neg, granulmatous inflammation on pleural bx's with necrosis but neg smears/cultures to date with ID f/u planned 12/18/18   12/13/2010 ov cc worse sob and cough since pleurex removed 6/12, sob mostly with steps/inclines, ok lying flat. Cough is dry and worse supine but breath ok flat. rec Stop atenolol Start bystolic 10 mg one daily - if blood pressure too low (light headed standing) then stop the caduet until next visit As long as coughing you need to take prilosec 20 mg Take 30-60 min before first meal of the day and Pepcid 20 mg at bedtime GERD (REFLUX)   12/18/10 start prednisone 20 mg per day by ID.  01/11/11 ov/Larry [name] cc cough and sob better and able to do more including steps on 20 mg per day but not back to baseline yet rec Reduce prednisone to 20 mg tablet   Take one half daily once you are better to your satisfaction. We always try to reduce the prednisone to the lowest possible dose.   02/22/2011 f/u ov/Larry [name] cc all better, no cough or sob but did not follow instructions to lower prednisone dose and still on 20. rec Try prednisone 10 mg one daily ok to  increase to 20 mg per day if worsen cough or breathing   04/09/2011 f/u ov/Larry [name]  On pred 10 mg per day cc cough gone, no sob. Some sense of nasal obstruction and throat irritation ? Worse when stopped  using gerd rx consistently.  No rash, ocular or articular co's. No sob. rec reduce prednisone to 10 mg qod     05/13/2011 f/u ov/Larry [name] cc no flare of cough or sob with taper pred to 10 mg qod - only resp complaint is nasal obst despite use of nasal steroids x months, no sinus pain or purulent nasal secretions rec Reduce prednisone to 5 mg every other day Ok to change the nexium to generic protonix (pantoprazole) 40 mg before bfast daily I emphasized that nasal steroids (flonase)  have no immediate benefit ivn". Please schedule a follow up office visit in 4 weeks, sooner if needed with cxr on return   06/14/2011 f/u ov/Larry [name] on '5mg'$  qod  cc increase cough assoc with watery sinus drainage x 2 days - otherwise was doing well on qod dosing, no arthralgias/ocular complaints, rash or sob. rec If cough or breathing worse, ok to increase prednisone to 5 mg two daily until better, the one daily for a week then resume one on even days    07/31/2011 f/u ov/Larry [name] weaned prednisone back to 5 mg every other day s flare of cough, minimal sinus drainage. Sob when bends only. No ocurlar or articular symptoms or rash. rec Try 5 mg prednisone one half on even  days x 6 weeks then return for cxr, office visit sooner if worse pain, breathing or cough When nose is more congested, try afrin then flonase twice daily x 5 days then stop afrin.  09/18/2011 f/u ov/Larry [name] cc main c/o is nasal congestion much better p afrin vs flonase alone, mostly R side with neg sinus ct noted 03/2011. No sob, cough, no problem with the 2.5 mg qod dosing of pred nor arthralgias/ rash. rec Stop the prednisone completely unless the symptoms you had before come back, then just restart the one half every day.    02/02/2019 Eval by Dr Carlis Abbott  DG Chest 2 View   CT Chest High Resolution   6 minute walk   Pulmonary function test   rec: hold off pred for now  Collagen vasc screen neg x for C-Anca Pos c titer 1:40    Date of admission:  02/09/2019             Date of discharge: 02/18/2019      Discharge Diagnoses:  Acute COVID-19 infection with GI bleed   HTN (hypertension), benign   COVID-19 virus infection   Symptomatic anemia   GI bleed   BPH (benign prostatic hyperplasia)   HLD (hyperlipidemia)            03/08/2019  f/u ov/Larry [name] re: recurrent onset sob since summer 2020  Chief Complaint  Patient presents with   Follow-up    OV with 68m. Patient has previously seen Dr. CCarlis Abbott States his breathing has been ok since last visit.   Dyspnea: improved = doe with  Steps or more than slow pace sob = MMRC2 = can't walk a nl pace on a flat grade s sob but does fine slow and flat  Cough: at hs nightly  then clears then recurs sporadically daytime with minimal mucoid sputum  Sleeping: flat bed two pillows better on R side down  SABA use: none  02: denies having any supplemental 02 at this point  rec F/u cxr    04/07/2019  f/u ov/[name] re:  Doe s/p covid pna Chief Complaint  Patient presents with   Follow-up    Breathing is much improved since the last visit.   Dyspnea:  Improved, able to do steps now, wants to return to work 04/09/19  Cough: a few coughs in am then fine for the rest of the day / no excess mucus Sleeping: 2 pillows/ tends to sleep L side down SABA use: none  02: no rec No change rx > f/u final cxr in 3 m    07/16/2019  f/u ov/Larry [name] re: chronic R effusion/ s/p covid  Chief Complaint  Patient presents with   Follow-up    Breathing is doing well and no new co's today.    Dyspnea:  Steps ok / moving all day at work Cough: minimal/ non productive  Sleeping: sleeps on side L side down / 2 pillows SABA use: none 02: none  Rec F/u prn    01/16/2021  f/u ov/Larry [name] re:  chronic R effusion presumed sarcoid / doe p covid 01/2019 No chief complaint on file.   Dyspnea: MMRC1 = can walk nl pace, flat grade, can't hurry or go uphills or steps s sob   Cough: not present now but can last 3-4 weeks   Sleeping: bed is flat/ 2 pillows  SABA use: none 02: none Covid status:   vax x 3 and had the original covid   No obvious day to day or  daytime variability or assoc excess/ purulent sputum or mucus plugs or hemoptysis or cp or chest tightness, subjective wheeze or overt sinus or hb symptoms.   Sleeping  without nocturnal  or early am exacerbation  of respiratory  c/o's or need for noct saba. Also denies any obvious fluctuation of symptoms with weather or environmental changes or other aggravating or alleviating factors except as outlined above   No unusual exposure hx or h/o childhood pna/ asthma or knowledge of premature birth.  Current Allergies, Complete Past Medical History, Past Surgical History, Family History, and Social History were reviewed in [employer] record.  ROS  The following are not active complaints unless bolded Hoarseness, sore throat, dysphagia, dental problems, itching, sneezing,  nasal congestion or discharge of excess mucus or purulent secretions, ear ache,   fever, chills, sweats, unintended wt loss or wt gain, classically pleuritic or exertional cp,  orthopnea pnd or arm/hand swelling  or leg swelling, presyncope, palpitations, abdominal pain, anorexia, nausea, vomiting, diarrhea  or change in bowel habits or change in bladder habits, change in stools or change in urine, dysuria, hematuria,  rash, arthralgias, visual complaints, headache, numbness, weakness or ataxia or problems with walking or coordination,  change in mood or  memory.        Current Meds  Medication Sig   atorvastatin (LIPITOR) 40 MG tablet Take 40 mg by mouth daily at 6 PM.    carvedilol (COREG) 25 MG tablet Take 25 mg by mouth 2 (two) times daily with a meal.    [employer] IRON 325 (65 Fe) MG tablet Take 1 tablet by mouth 2 (two) times daily.   fluticasone (FLONASE) 50 MCG/ACT nasal spray Place 2 sprays into both nostrils daily.   pantoprazole (PROTONIX) 40 MG tablet Take 40 mg  by mouth daily.   tamsulosin (FLOMAX) 0.4 MG CAPS capsule Take 0.4 mg by mouth daily.                      PMHx: Hypertension Hyperlipidemia R Pleural effusion     - R tcentesis x 800 cc 10/31/2010  Exudative Lymph> segs, benign inflammatory changes on cyt     - EBUS and biopsy as well as right VATS and PleuX placement on Nov 16, 2010>  NCG with all studies neg o/w       Soc Hx: Never smoked, no heavy industry  Fm Hx  HBP/  Dm mother ? Colon problem in father 5 total siblings all healthy        Objective:   Physical Exam       01/16/2021         212 07/16/2019        209  04/07/19 201 lb (91.2 kg)  03/08/19 187 lb (84.8 kg)  02/18/19 175 lb 6.4 oz (79.6 kg)    Vital signs reviewed  01/16/2021  - Note at rest 02 sats  99% on RA   General appearance:    amb pleasant bm nad     HEENT : pt wearing mask not removed for exam due to covid -19 concerns.    NECK :  without JVD/Nodes/TM/ nl carotid upstrokes bilaterally   LUNGS: no acc muscle use,  Nl contour chest  with decreased bs/ dullness R base  without cough on insp or exp maneuvers   CV:  RRR  no s3 or murmur or increase in P2, and no edema   ABD:  soft and nontender with nl  inspiratory excursion in the supine position. No bruits or organomegaly appreciated, bowel sounds nl  MS:  Nl gait/ ext warm without deformities, calf tenderness, cyanosis or clubbing No obvious joint restrictions   SKIN: warm and dry without lesions    NEURO:  alert, approp, nl sensorium with  no motor or cerebellar deficits apparent.     CXR PA and Lateral:   01/16/2021 :    I personally reviewed images and  impression as follows:    No change mod R effusion    Assessment & Plan:

## 2021-01-16 NOTE — Patient Instructions (Addendum)
Next time you catch a cold/ cough recommend:  Mucinex dm 1200 every 12 hours as needed (no robittussin)  Try prilosec otc '20mg'$   Take 30-60 min before first meal of the day and Pepcid ac (famotidine) 20 mg one @  bedtime until cough is completely gone for at least a week without the need for cough suppression     GERD (REFLUX)  is an extremely common cause of respiratory symptoms just like yours , many times with no obvious heartburn at all.    It can be treated with medication, but also with lifestyle changes including elevation of the head of your bed (ideally with 6 -8inch blocks under the headboard of your bed),  Smoking cessation, avoidance of late meals, excessive alcohol, and avoid fatty foods, chocolate, peppermint, colas, red wine, and acidic juices such as orange juice.  NO MINT OR MENTHOL PRODUCTS SO NO COUGH DROPS  USE SUGARLESS CANDY INSTEAD (Jolley ranchers or Stover's or Life Savers) or even ice chips will also do - the key is to swallow to prevent all throat clearing. NO OIL BASED VITAMINS - use powdered substitutes.  Avoid fish oil when coughing.    Please remember to go to the  x-ray department  for your tests - we will call you with the results when they are available    Follow up is as needed.

## 2021-01-17 NOTE — Progress Notes (Signed)
Called pt and there was no answer, LMTCB.  

## 2021-01-18 ENCOUNTER — Telehealth: Payer: Self-pay | Admitting: Internal Medicine

## 2021-01-18 NOTE — Telephone Encounter (Signed)
Tanda Rockers, MD  01/16/2021  1:19 PM EDT      Call pt:  Reviewed cxr and no acute change so no change in recommendations madeat ov   Called patient but he did not answer. Left message for him to call back.

## 2021-01-24 DIAGNOSIS — I10 Essential (primary) hypertension: Secondary | ICD-10-CM | POA: Diagnosis not present

## 2021-01-24 DIAGNOSIS — D500 Iron deficiency anemia secondary to blood loss (chronic): Secondary | ICD-10-CM | POA: Diagnosis not present

## 2021-01-24 DIAGNOSIS — E7800 Pure hypercholesterolemia, unspecified: Secondary | ICD-10-CM | POA: Diagnosis not present

## 2021-01-25 NOTE — Telephone Encounter (Signed)
Lmtcb for pt.  

## 2021-01-26 NOTE — Telephone Encounter (Signed)
Called spoke with wife per DPR also it states can leave detailed message.   Let her know the results.  Nothing further needed at this time.

## 2021-03-28 DIAGNOSIS — E7800 Pure hypercholesterolemia, unspecified: Secondary | ICD-10-CM | POA: Diagnosis not present

## 2021-03-28 DIAGNOSIS — D500 Iron deficiency anemia secondary to blood loss (chronic): Secondary | ICD-10-CM | POA: Diagnosis not present

## 2021-03-28 DIAGNOSIS — I10 Essential (primary) hypertension: Secondary | ICD-10-CM | POA: Diagnosis not present

## 2021-04-23 DIAGNOSIS — L303 Infective dermatitis: Secondary | ICD-10-CM | POA: Diagnosis not present

## 2021-04-23 DIAGNOSIS — E785 Hyperlipidemia, unspecified: Secondary | ICD-10-CM | POA: Diagnosis not present

## 2021-04-23 DIAGNOSIS — L309 Dermatitis, unspecified: Secondary | ICD-10-CM | POA: Diagnosis not present

## 2021-04-23 DIAGNOSIS — S338XXA Sprain of other parts of lumbar spine and pelvis, initial encounter: Secondary | ICD-10-CM | POA: Diagnosis not present

## 2021-04-23 DIAGNOSIS — I10 Essential (primary) hypertension: Secondary | ICD-10-CM | POA: Diagnosis not present

## 2021-05-30 DIAGNOSIS — E785 Hyperlipidemia, unspecified: Secondary | ICD-10-CM | POA: Diagnosis not present

## 2021-05-30 DIAGNOSIS — I10 Essential (primary) hypertension: Secondary | ICD-10-CM | POA: Diagnosis not present

## 2021-06-05 ENCOUNTER — Other Ambulatory Visit: Payer: Self-pay | Admitting: Internal Medicine

## 2021-06-05 DIAGNOSIS — L309 Dermatitis, unspecified: Secondary | ICD-10-CM | POA: Diagnosis not present

## 2021-06-05 DIAGNOSIS — E785 Hyperlipidemia, unspecified: Secondary | ICD-10-CM | POA: Diagnosis not present

## 2021-06-05 DIAGNOSIS — I10 Essential (primary) hypertension: Secondary | ICD-10-CM | POA: Diagnosis not present

## 2021-06-05 DIAGNOSIS — R7303 Prediabetes: Secondary | ICD-10-CM | POA: Diagnosis not present

## 2021-06-05 DIAGNOSIS — M25551 Pain in right hip: Secondary | ICD-10-CM | POA: Diagnosis not present

## 2021-06-07 DIAGNOSIS — I10 Essential (primary) hypertension: Secondary | ICD-10-CM | POA: Diagnosis not present

## 2021-06-07 DIAGNOSIS — E7800 Pure hypercholesterolemia, unspecified: Secondary | ICD-10-CM | POA: Diagnosis not present

## 2021-07-09 ENCOUNTER — Ambulatory Visit
Admission: RE | Admit: 2021-07-09 | Discharge: 2021-07-09 | Disposition: A | Discharge status: Home / Self Care | Payer: Medicare Other | Source: Ambulatory Visit | Attending: Internal Medicine | Admitting: Internal Medicine

## 2021-07-09 ENCOUNTER — Ambulatory Visit
Admission: RE | Admit: 2021-07-09 | Discharge: 2021-07-09 | Disposition: A | Discharge status: Home / Self Care | Payer: Medicare Other | Attending: Internal Medicine | Admitting: Internal Medicine

## 2021-07-09 DIAGNOSIS — M25551 Pain in right hip: Secondary | ICD-10-CM | POA: Diagnosis not present

## 2021-07-17 DIAGNOSIS — M7061 Trochanteric bursitis, right hip: Secondary | ICD-10-CM | POA: Diagnosis not present

## 2021-07-17 DIAGNOSIS — S338XXA Sprain of other parts of lumbar spine and pelvis, initial encounter: Secondary | ICD-10-CM | POA: Diagnosis not present

## 2021-07-17 DIAGNOSIS — M25551 Pain in right hip: Secondary | ICD-10-CM | POA: Diagnosis not present

## 2021-07-17 DIAGNOSIS — R7303 Prediabetes: Secondary | ICD-10-CM | POA: Diagnosis not present

## 2021-07-17 DIAGNOSIS — I10 Essential (primary) hypertension: Secondary | ICD-10-CM | POA: Diagnosis not present

## 2021-07-17 DIAGNOSIS — E785 Hyperlipidemia, unspecified: Secondary | ICD-10-CM | POA: Diagnosis not present

## 2021-07-17 DIAGNOSIS — L309 Dermatitis, unspecified: Secondary | ICD-10-CM | POA: Diagnosis not present

## 2021-07-26 DIAGNOSIS — M549 Dorsalgia, unspecified: Secondary | ICD-10-CM | POA: Diagnosis not present

## 2021-07-26 DIAGNOSIS — M25551 Pain in right hip: Secondary | ICD-10-CM | POA: Diagnosis not present

## 2021-07-26 DIAGNOSIS — M79642 Pain in left hand: Secondary | ICD-10-CM | POA: Diagnosis not present

## 2021-07-26 DIAGNOSIS — M79671 Pain in right foot: Secondary | ICD-10-CM | POA: Diagnosis not present

## 2021-07-26 DIAGNOSIS — M79672 Pain in left foot: Secondary | ICD-10-CM | POA: Diagnosis not present

## 2021-07-26 DIAGNOSIS — M2550 Pain in unspecified joint: Secondary | ICD-10-CM | POA: Diagnosis not present

## 2021-07-26 DIAGNOSIS — M1990 Unspecified osteoarthritis, unspecified site: Secondary | ICD-10-CM | POA: Diagnosis not present

## 2021-07-26 DIAGNOSIS — M79641 Pain in right hand: Secondary | ICD-10-CM | POA: Diagnosis not present

## 2021-07-26 DIAGNOSIS — R21 Rash and other nonspecific skin eruption: Secondary | ICD-10-CM | POA: Diagnosis not present

## 2021-08-17 ENCOUNTER — Other Ambulatory Visit: Payer: Self-pay | Admitting: Rheumatology

## 2021-08-17 DIAGNOSIS — M549 Dorsalgia, unspecified: Secondary | ICD-10-CM

## 2021-08-17 DIAGNOSIS — M25551 Pain in right hip: Secondary | ICD-10-CM | POA: Diagnosis not present

## 2021-08-17 DIAGNOSIS — M2550 Pain in unspecified joint: Secondary | ICD-10-CM | POA: Diagnosis not present

## 2021-08-17 DIAGNOSIS — M1990 Unspecified osteoarthritis, unspecified site: Secondary | ICD-10-CM | POA: Diagnosis not present

## 2021-08-17 DIAGNOSIS — R21 Rash and other nonspecific skin eruption: Secondary | ICD-10-CM | POA: Diagnosis not present

## 2021-09-02 ENCOUNTER — Ambulatory Visit
Admission: RE | Admit: 2021-09-02 | Discharge: 2021-09-02 | Disposition: A | Discharge status: Home / Self Care | Payer: Medicare Other | Source: Ambulatory Visit | Attending: Rheumatology | Admitting: Rheumatology

## 2021-09-02 ENCOUNTER — Other Ambulatory Visit: Payer: Self-pay

## 2021-09-02 DIAGNOSIS — M549 Dorsalgia, unspecified: Secondary | ICD-10-CM

## 2021-09-03 ENCOUNTER — Ambulatory Visit
Admission: RE | Admit: 2021-09-03 | Discharge: 2021-09-03 | Disposition: A | Discharge status: Home / Self Care | Payer: Medicare Other | Source: Ambulatory Visit | Attending: Rheumatology | Admitting: Rheumatology

## 2021-09-03 DIAGNOSIS — M5136 Other intervertebral disc degeneration, lumbar region: Secondary | ICD-10-CM | POA: Diagnosis not present

## 2021-09-03 DIAGNOSIS — M5137 Other intervertebral disc degeneration, lumbosacral region: Secondary | ICD-10-CM | POA: Diagnosis not present

## 2021-09-03 DIAGNOSIS — R600 Localized edema: Secondary | ICD-10-CM | POA: Diagnosis not present

## 2021-09-03 DIAGNOSIS — M5126 Other intervertebral disc displacement, lumbar region: Secondary | ICD-10-CM | POA: Diagnosis not present

## 2021-09-17 DIAGNOSIS — E785 Hyperlipidemia, unspecified: Secondary | ICD-10-CM | POA: Diagnosis not present

## 2021-09-19 DIAGNOSIS — L309 Dermatitis, unspecified: Secondary | ICD-10-CM | POA: Diagnosis not present

## 2021-09-19 DIAGNOSIS — M7061 Trochanteric bursitis, right hip: Secondary | ICD-10-CM | POA: Diagnosis not present

## 2021-09-19 DIAGNOSIS — E785 Hyperlipidemia, unspecified: Secondary | ICD-10-CM | POA: Diagnosis not present

## 2021-09-19 DIAGNOSIS — M25551 Pain in right hip: Secondary | ICD-10-CM | POA: Diagnosis not present

## 2021-09-19 DIAGNOSIS — I10 Essential (primary) hypertension: Secondary | ICD-10-CM | POA: Diagnosis not present

## 2021-09-21 DIAGNOSIS — M549 Dorsalgia, unspecified: Secondary | ICD-10-CM | POA: Diagnosis not present

## 2021-09-21 DIAGNOSIS — R21 Rash and other nonspecific skin eruption: Secondary | ICD-10-CM | POA: Diagnosis not present

## 2021-09-21 DIAGNOSIS — M1990 Unspecified osteoarthritis, unspecified site: Secondary | ICD-10-CM | POA: Diagnosis not present

## 2021-09-21 DIAGNOSIS — M2550 Pain in unspecified joint: Secondary | ICD-10-CM | POA: Diagnosis not present

## 2021-11-05 DIAGNOSIS — M25551 Pain in right hip: Secondary | ICD-10-CM | POA: Diagnosis not present

## 2021-11-05 DIAGNOSIS — E785 Hyperlipidemia, unspecified: Secondary | ICD-10-CM | POA: Diagnosis not present

## 2021-11-05 DIAGNOSIS — M7061 Trochanteric bursitis, right hip: Secondary | ICD-10-CM | POA: Diagnosis not present

## 2021-11-05 DIAGNOSIS — I10 Essential (primary) hypertension: Secondary | ICD-10-CM | POA: Diagnosis not present

## 2021-11-05 DIAGNOSIS — L309 Dermatitis, unspecified: Secondary | ICD-10-CM | POA: Diagnosis not present

## 2021-12-14 DIAGNOSIS — R7303 Prediabetes: Secondary | ICD-10-CM | POA: Diagnosis not present

## 2021-12-14 DIAGNOSIS — E785 Hyperlipidemia, unspecified: Secondary | ICD-10-CM | POA: Diagnosis not present

## 2021-12-18 DIAGNOSIS — M7061 Trochanteric bursitis, right hip: Secondary | ICD-10-CM | POA: Diagnosis not present

## 2021-12-18 DIAGNOSIS — L309 Dermatitis, unspecified: Secondary | ICD-10-CM | POA: Diagnosis not present

## 2021-12-18 DIAGNOSIS — E785 Hyperlipidemia, unspecified: Secondary | ICD-10-CM | POA: Diagnosis not present

## 2021-12-18 DIAGNOSIS — I10 Essential (primary) hypertension: Secondary | ICD-10-CM | POA: Diagnosis not present

## 2021-12-18 DIAGNOSIS — M25551 Pain in right hip: Secondary | ICD-10-CM | POA: Diagnosis not present

## 2022-01-16 DIAGNOSIS — D500 Iron deficiency anemia secondary to blood loss (chronic): Secondary | ICD-10-CM | POA: Diagnosis not present

## 2022-01-16 DIAGNOSIS — E7800 Pure hypercholesterolemia, unspecified: Secondary | ICD-10-CM | POA: Diagnosis not present

## 2022-01-16 DIAGNOSIS — I10 Essential (primary) hypertension: Secondary | ICD-10-CM | POA: Diagnosis not present

## 2022-01-23 DIAGNOSIS — R0602 Shortness of breath: Secondary | ICD-10-CM | POA: Diagnosis not present

## 2022-01-23 DIAGNOSIS — M25551 Pain in right hip: Secondary | ICD-10-CM | POA: Diagnosis not present

## 2022-01-23 DIAGNOSIS — E7800 Pure hypercholesterolemia, unspecified: Secondary | ICD-10-CM | POA: Diagnosis not present

## 2022-01-23 DIAGNOSIS — I10 Essential (primary) hypertension: Secondary | ICD-10-CM | POA: Diagnosis not present

## 2022-01-23 DIAGNOSIS — R21 Rash and other nonspecific skin eruption: Secondary | ICD-10-CM | POA: Diagnosis not present

## 2022-01-23 DIAGNOSIS — Z0000 Encounter for general adult medical examination without abnormal findings: Secondary | ICD-10-CM | POA: Diagnosis not present

## 2022-02-12 DIAGNOSIS — L309 Dermatitis, unspecified: Secondary | ICD-10-CM | POA: Diagnosis not present

## 2022-02-12 DIAGNOSIS — M25551 Pain in right hip: Secondary | ICD-10-CM | POA: Diagnosis not present

## 2022-02-12 DIAGNOSIS — I10 Essential (primary) hypertension: Secondary | ICD-10-CM | POA: Diagnosis not present

## 2022-02-12 DIAGNOSIS — M7061 Trochanteric bursitis, right hip: Secondary | ICD-10-CM | POA: Diagnosis not present

## 2022-02-12 DIAGNOSIS — E785 Hyperlipidemia, unspecified: Secondary | ICD-10-CM | POA: Diagnosis not present

## 2022-04-17 DIAGNOSIS — L309 Dermatitis, unspecified: Secondary | ICD-10-CM | POA: Diagnosis not present

## 2022-04-17 DIAGNOSIS — E785 Hyperlipidemia, unspecified: Secondary | ICD-10-CM | POA: Diagnosis not present

## 2022-04-17 DIAGNOSIS — M25551 Pain in right hip: Secondary | ICD-10-CM | POA: Diagnosis not present

## 2022-04-17 DIAGNOSIS — M7061 Trochanteric bursitis, right hip: Secondary | ICD-10-CM | POA: Diagnosis not present

## 2022-04-17 DIAGNOSIS — I10 Essential (primary) hypertension: Secondary | ICD-10-CM | POA: Diagnosis not present

## 2022-05-20 DIAGNOSIS — E785 Hyperlipidemia, unspecified: Secondary | ICD-10-CM | POA: Diagnosis not present

## 2022-05-22 DIAGNOSIS — E785 Hyperlipidemia, unspecified: Secondary | ICD-10-CM | POA: Diagnosis not present

## 2022-05-22 DIAGNOSIS — M7061 Trochanteric bursitis, right hip: Secondary | ICD-10-CM | POA: Diagnosis not present

## 2022-05-22 DIAGNOSIS — I10 Essential (primary) hypertension: Secondary | ICD-10-CM | POA: Diagnosis not present

## 2022-05-22 DIAGNOSIS — L309 Dermatitis, unspecified: Secondary | ICD-10-CM | POA: Diagnosis not present

## 2022-05-22 DIAGNOSIS — M25551 Pain in right hip: Secondary | ICD-10-CM | POA: Diagnosis not present

## 2022-08-21 ENCOUNTER — Other Ambulatory Visit: Payer: Self-pay | Admitting: Internal Medicine

## 2022-08-21 DIAGNOSIS — R7303 Prediabetes: Secondary | ICD-10-CM | POA: Diagnosis not present

## 2022-08-21 DIAGNOSIS — E785 Hyperlipidemia, unspecified: Secondary | ICD-10-CM | POA: Diagnosis not present

## 2022-08-22 LAB — COMPREHENSIVE METABOLIC PANEL
ALT: 18 IU/L (ref 0–44)
AST: 17 IU/L (ref 0–40)
Albumin/Globulin Ratio: 1.6 (ref 1.2–2.2)
Albumin: 4.2 g/dL (ref 3.8–4.8)
Alkaline Phosphatase: 65 IU/L (ref 44–121)
BUN/Creatinine Ratio: 20 (ref 10–24)
BUN: 25 mg/dL (ref 8–27)
Bilirubin Total: 0.6 mg/dL (ref 0.0–1.2)
CO2: 21 mmol/L (ref 20–29)
Calcium: 9.2 mg/dL (ref 8.6–10.2)
Chloride: 103 mmol/L (ref 96–106)
Creatinine, Ser: 1.23 mg/dL (ref 0.76–1.27)
Globulin, Total: 2.6 g/dL (ref 1.5–4.5)
Glucose: 97 mg/dL (ref 70–99)
Potassium: 4 mmol/L (ref 3.5–5.2)
Sodium: 141 mmol/L (ref 134–144)
Total Protein: 6.8 g/dL (ref 6.0–8.5)
eGFR: 63 mL/min/{1.73_m2} (ref >59)

## 2022-08-22 LAB — HGB A1C W/O EAG: Hgb A1c MFr Bld: 6 % — ABNORMAL HIGH (ref 4.8–5.6)

## 2022-08-22 LAB — LIPID PANEL W/O CHOL/HDL RATIO
Cholesterol, Total: 142 mg/dL (ref 100–199)
HDL: 49 mg/dL (ref >39)
LDL Chol Calc (NIH): 79 mg/dL (ref 0–99)
Triglycerides: 69 mg/dL (ref 0–149)
VLDL Cholesterol Cal: 14 mg/dL (ref 5–40)

## 2022-08-23 ENCOUNTER — Ambulatory Visit (INDEPENDENT_AMBULATORY_CARE_PROVIDER_SITE_OTHER): Payer: Medicare Other | Admitting: Internal Medicine

## 2022-08-23 ENCOUNTER — Encounter: Payer: Self-pay | Admitting: Internal Medicine

## 2022-08-23 VITALS — BP 130/70 | HR 68 | Ht 73.0 in | Wt 213.6 lb

## 2022-08-23 DIAGNOSIS — E782 Mixed hyperlipidemia: Secondary | ICD-10-CM

## 2022-08-23 DIAGNOSIS — I10 Essential (primary) hypertension: Secondary | ICD-10-CM | POA: Diagnosis not present

## 2022-08-23 NOTE — Progress Notes (Signed)
Established Patient Office Visit  Subjective:  Patient ID: Larry [name], male    DOB: [birth year]  Age: 72 y.o. MRN: [id number]  Chief Complaint  Patient presents with   Follow-up    3 mo fu    No new complaints, here for lab review and medication refills. BP well controlled and Labs reviewed and notable for well controlled lipids, A1c in the prediabetic range and unremarkable cmp and cbc.    Past Medical History:  Diagnosis Date   Abdominal pain    Chest pain    Fever    Generalized headaches    GERD (gastroesophageal reflux disease)    Granulomatous lung disease ([place]) 2012   Sarim Rothman/p R VATS   Hyperlipidemia    Hypertension    Multiple allergies    Nasal congestion     Social History   Socioeconomic History   Marital status: Married    Spouse name: Not on file   Number of children: 3   Years of education: Not on file   Highest education level: Not on file  Occupational History   Occupation: Engineer, maintenance (IT)    Employer: [employer]  Tobacco Use   Smoking status: Never   Smokeless tobacco: Never  Vaping Use   Vaping Use: Never used  Substance and Sexual Activity   Alcohol use: Yes    Comment: occasional   Drug use: No   Sexual activity: Not on file  Other Topics Concern   Not on file  Social History Narrative   Not on file   Social Determinants of Health   Financial Resource Strain: Not on file  Food Insecurity: Not on file  Transportation Needs: Not on file  Physical Activity: Not on file  Stress: Not on file  Social Connections: Not on file  Intimate Partner Violence: Not on file    Family History  Problem Relation Age of Onset   Diabetes Mother    Deep vein thrombosis Mother    Allergies Son     No Known Allergies  Review of Systems  All other systems reviewed and are negative.      Objective:   BP 130/70   Pulse 68   Ht '6\' 1"'$  (1.854 m)   Wt 213 lb 9.6 oz (96.9 kg)   SpO2 97%   BMI 28.18 kg/m   Vitals:   08/23/22 1053   BP: 130/70  Pulse: 68  Height: '6\' 1"'$  (1.854 m)  Weight: 213 lb 9.6 oz (96.9 kg)  SpO2: 97%  BMI (Calculated): 28.19    Physical Exam Vitals reviewed.  Constitutional:      Appearance: Normal appearance.  HENT:     Head: Normocephalic.     Left Ear: There is no impacted cerumen.     Nose: Nose normal.     Mouth/Throat:     Mouth: Mucous membranes are moist.     Pharynx: No posterior oropharyngeal erythema.  Eyes:     Extraocular Movements: Extraocular movements intact.     Pupils: Pupils are equal, round, and reactive to light.  Cardiovascular:     Rate and Rhythm: Regular rhythm.     Chest Wall: PMI is not displaced.     Pulses: Normal pulses.     Heart sounds: Normal heart sounds. No murmur heard. Pulmonary:     Effort: Pulmonary effort is normal.     Breath sounds: Normal air entry. No rhonchi or rales.  Abdominal:     General: Abdomen is flat. Bowel  sounds are normal. There is no distension.     Palpations: Abdomen is soft. There is no hepatomegaly, splenomegaly or mass.     Tenderness: There is no abdominal tenderness.  Musculoskeletal:        General: Normal range of motion.     Cervical back: Normal range of motion and neck supple.     Right lower leg: No edema.     Left lower leg: No edema.  Skin:    General: Skin is warm and dry.  Neurological:     General: No focal deficit present.     Mental Status: He is alert and oriented to person, place, and time.     Cranial Nerves: No cranial nerve deficit.     Motor: No weakness.  Psychiatric:        Mood and Affect: Mood normal.        Behavior: Behavior normal.      No results found for any visits on 08/23/22.  Recent Results (from the past 2160 hour(Shonia Skilling))  Comprehensive metabolic panel     Status: None   Collection Time: 08/21/22  8:12 AM  Result Value Ref Range   Glucose 97 70 - 99 mg/dL   BUN 25 8 - 27 mg/dL   Creatinine, Ser 1.23 0.76 - 1.27 mg/dL   eGFR 63 >59 mL/min/1.73   BUN/Creatinine Ratio 20  10 - 24   Sodium 141 134 - 144 mmol/L   Potassium 4.0 3.5 - 5.2 mmol/L   Chloride 103 96 - 106 mmol/L   CO2 21 20 - 29 mmol/L   Calcium 9.2 8.6 - 10.2 mg/dL   Total Protein 6.8 6.0 - 8.5 g/dL   Albumin 4.2 3.8 - 4.8 g/dL   Globulin, Total 2.6 1.5 - 4.5 g/dL   Albumin/Globulin Ratio 1.6 1.2 - 2.2   Bilirubin Total 0.6 0.0 - 1.2 mg/dL   Alkaline Phosphatase 65 44 - 121 IU/L   AST 17 0 - 40 IU/L   ALT 18 0 - 44 IU/L  Lipid Panel w/o Chol/HDL Ratio     Status: None   Collection Time: 08/21/22  8:12 AM  Result Value Ref Range   Cholesterol, Total 142 100 - 199 mg/dL   Triglycerides 69 0 - 149 mg/dL   HDL 49 >39 mg/dL   VLDL Cholesterol Cal 14 5 - 40 mg/dL   LDL Chol Calc (NIH) 79 0 - 99 mg/dL  Hgb A1c w/o eAG     Status: Abnormal   Collection Time: 08/21/22  8:12 AM  Result Value Ref Range   Hgb A1c MFr Bld 6.0 (H) 4.8 - 5.6 %    Comment:          Prediabetes: 5.7 - 6.4          Diabetes: >6.4          Glycemic control for adults with diabetes: <7.0       Assessment & Plan:   Problem List Items Addressed This Visit   None   No follow-ups on file.   Total time spent: 20 minutes  Volanda Napoleon, MD  08/23/2022

## 2022-09-07 ENCOUNTER — Other Ambulatory Visit: Payer: Self-pay | Admitting: Internal Medicine

## 2022-09-07 DIAGNOSIS — E785 Hyperlipidemia, unspecified: Secondary | ICD-10-CM

## 2022-09-14 ENCOUNTER — Other Ambulatory Visit: Payer: Self-pay | Admitting: Internal Medicine

## 2022-09-14 DIAGNOSIS — I10 Essential (primary) hypertension: Secondary | ICD-10-CM

## 2022-11-25 ENCOUNTER — Ambulatory Visit: Payer: Medicare Other | Admitting: Internal Medicine

## 2022-11-29 ENCOUNTER — Other Ambulatory Visit: Payer: Self-pay

## 2022-11-29 DIAGNOSIS — E782 Mixed hyperlipidemia: Secondary | ICD-10-CM

## 2022-11-29 LAB — LIPID PANEL
Chol/HDL Ratio: 2.7 ratio (ref 0.0–5.0)
Cholesterol, Total: 143 mg/dL (ref 100–199)
HDL: 53 mg/dL (ref >39)
LDL Chol Calc (NIH): 78 mg/dL (ref 0–99)
Triglycerides: 56 mg/dL (ref 0–149)
VLDL Cholesterol Cal: 12 mg/dL (ref 5–40)

## 2022-11-29 LAB — CK: Total CK: 164 U/L (ref 41–331)

## 2022-12-03 ENCOUNTER — Ambulatory Visit (INDEPENDENT_AMBULATORY_CARE_PROVIDER_SITE_OTHER): Payer: Medicare Other | Admitting: Internal Medicine

## 2022-12-03 ENCOUNTER — Encounter: Payer: Self-pay | Admitting: Internal Medicine

## 2022-12-03 VITALS — BP 126/68 | HR 64 | Ht 73.0 in | Wt 211.2 lb

## 2022-12-03 DIAGNOSIS — E782 Mixed hyperlipidemia: Secondary | ICD-10-CM

## 2022-12-03 DIAGNOSIS — I10 Essential (primary) hypertension: Secondary | ICD-10-CM

## 2022-12-03 MED ORDER — VALSARTAN-HYDROCHLOROTHIAZIDE 320-12.5 MG PO TABS: ORAL_TABLET | ORAL | 1 refills | Status: DC

## 2022-12-03 MED ORDER — ATORVASTATIN CALCIUM 40 MG PO TABS: 40.0000 mg | ORAL_TABLET | Freq: Every day | ORAL | 0 refills | Status: DC

## 2022-12-03 NOTE — Progress Notes (Signed)
Established Patient Office Visit  Subjective:  Patient ID: Larry [name], male    DOB: [birth year]  Age: 72 y.o. MRN: [id number]  Chief Complaint  Patient presents with   Follow-up    3 month follow up, discuss lab results.    No new complaints, here for lab review and medication refills. LDL and TC well controlled on lab review. Triglycerides also satisfactory. BP remains well controlled.    No other concerns at this time.   Past Medical History:  Diagnosis Date   Abdominal pain    Chest pain    Fever    Generalized headaches    GERD (gastroesophageal reflux disease)    Granulomatous lung disease ([facility]) 2012   Caria Transue/p R VATS   Hyperlipidemia    Hypertension    Multiple allergies    Nasal congestion     Past Surgical History:  Procedure Laterality Date   Drainage of pleural effusion.  11/16/2010   ENDOBRONCHIAL ULTRASOUND  11/16/2010   FIBEROPTIC BRONCHOSCOPY  11/16/2010   Dr Edwyna Shell, ytology from EBUS on Nov 16, 2010, right lung    Insertion of PleurX catheter.  11/16/2010   Pleural biopsies.  11/16/2010   Right VATS.  11/16/2010    Social History   Socioeconomic History   Marital status: Married    Spouse name: Not on file   Number of children: 3   Years of education: Not on file   Highest education level: Not on file  Occupational History   Occupation: Banker    Employer: [employer]  Tobacco Use   Smoking status: Never   Smokeless tobacco: Never  Vaping Use   Vaping Use: Never used  Substance and Sexual Activity   Alcohol use: Yes    Comment: occasional   Drug use: No   Sexual activity: Not on file  Other Topics Concern   Not on file  Social History Narrative   Not on file   Social Determinants of Health   Financial Resource Strain: Not on file  Food Insecurity: Not on file  Transportation Needs: Not on file  Physical Activity: Not on file  Stress: Not on file  Social Connections: Not on file  Intimate Partner Violence: Not on  file    Family History  Problem Relation Age of Onset   Diabetes Mother    Deep vein thrombosis Mother    Allergies Son     No Known Allergies  Review of Systems  All other systems reviewed and are negative.      Objective:   BP 126/68   Pulse 64   Ht 6\' 1"  (1.854 m)   Wt 211 lb 3.2 oz (95.8 kg)   SpO2 95%   BMI 27.86 kg/m   Vitals:   12/03/22 0950  BP: 126/68  Pulse: 64  Height: 6\' 1"  (1.854 m)  Weight: 211 lb 3.2 oz (95.8 kg)  SpO2: 95%  BMI (Calculated): 27.87    Physical Exam Vitals reviewed.  Constitutional:      Appearance: Normal appearance.  HENT:     Head: Normocephalic.     Left Ear: There is no impacted cerumen.     Nose: Nose normal.     Mouth/Throat:     Mouth: Mucous membranes are moist.     Pharynx: No posterior oropharyngeal erythema.  Eyes:     Extraocular Movements: Extraocular movements intact.     Pupils: Pupils are equal, round, and reactive to light.  Cardiovascular:  Rate and Rhythm: Regular rhythm.     Chest Wall: PMI is not displaced.     Pulses: Normal pulses.     Heart sounds: Normal heart sounds. No murmur heard. Pulmonary:     Effort: Pulmonary effort is normal.     Breath sounds: Normal air entry. No rhonchi or rales.  Abdominal:     General: Abdomen is flat. Bowel sounds are normal. There is no distension.     Palpations: Abdomen is soft. There is no hepatomegaly, splenomegaly or mass.     Tenderness: There is no abdominal tenderness.  Musculoskeletal:        General: Normal range of motion.     Cervical back: Normal range of motion and neck supple.     Right lower leg: No edema.     Left lower leg: No edema.  Skin:    General: Skin is warm and dry.  Neurological:     General: No focal deficit present.     Mental Status: He is alert and oriented to person, place, and time.     Cranial Nerves: No cranial nerve deficit.     Motor: No weakness.  Psychiatric:        Mood and Affect: Mood normal.         Behavior: Behavior normal.      No results found for any visits on 12/03/22.  Recent Results (from the past 2160 hour(Cederick Broadnax))  CK     Status: None   Collection Time: 11/29/22  8:55 AM  Result Value Ref Range   Total CK 164 41 - 331 U/L  Lipid panel     Status: None   Collection Time: 11/29/22  8:55 AM  Result Value Ref Range   Cholesterol, Total 143 100 - 199 mg/dL   Triglycerides 56 0 - 149 mg/dL   HDL 53 >64 mg/dL   VLDL Cholesterol Cal 12 5 - 40 mg/dL   LDL Chol Calc (NIH) 78 0 - 99 mg/dL   Chol/HDL Ratio 2.7 0.0 - 5.0 ratio    Comment:                                   T. Chol/HDL Ratio                                             Men  Women                               1/2 Avg.Risk  3.4    3.3                                   Avg.Risk  5.0    4.4                                2X Avg.Risk  9.6    7.1                                3X Avg.Risk 23.4   11.0  Assessment & Plan:  As per problem list  Problem List Items Addressed This Visit       Cardiovascular and Mediastinum   HTN (hypertension), benign - Primary   Relevant Medications   valsartan-hydrochlorothiazide (DIOVAN-HCT) 320-12.5 MG tablet   Other Relevant Orders   Comprehensive metabolic panel     Other   HLD (hyperlipidemia)   Relevant Medications   valsartan-hydrochlorothiazide (DIOVAN-HCT) 320-12.5 MG tablet   Other Relevant Orders   Comprehensive metabolic panel   Lipid panel   Other Visit Diagnoses     Essential (primary) hypertension       Relevant Medications   valsartan-hydrochlorothiazide (DIOVAN-HCT) 320-12.5 MG tablet       Return in about 3 months (around 03/05/2023) for awv with labs prior.   Total time spent: 20 minutes  [name], MD  12/03/2022   This document may have been prepared by [facility] Voice Recognition software and as such may include unintentional dictation errors.

## 2022-12-07 ENCOUNTER — Other Ambulatory Visit: Payer: Self-pay | Admitting: Internal Medicine

## 2022-12-07 DIAGNOSIS — I10 Essential (primary) hypertension: Secondary | ICD-10-CM

## 2023-01-27 ENCOUNTER — Other Ambulatory Visit: Payer: Self-pay | Admitting: Internal Medicine

## 2023-01-27 DIAGNOSIS — E782 Mixed hyperlipidemia: Secondary | ICD-10-CM

## 2023-02-10 ENCOUNTER — Other Ambulatory Visit: Payer: Self-pay | Admitting: Internal Medicine

## 2023-02-10 DIAGNOSIS — I10 Essential (primary) hypertension: Secondary | ICD-10-CM

## 2023-02-14 DIAGNOSIS — Z862 Personal history of diseases of the blood and blood-forming organs and certain disorders involving the immune mechanism: Secondary | ICD-10-CM | POA: Diagnosis not present

## 2023-02-14 DIAGNOSIS — E7800 Pure hypercholesterolemia, unspecified: Secondary | ICD-10-CM | POA: Diagnosis not present

## 2023-02-14 DIAGNOSIS — I10 Essential (primary) hypertension: Secondary | ICD-10-CM | POA: Diagnosis not present

## 2023-02-18 DIAGNOSIS — D696 Thrombocytopenia, unspecified: Secondary | ICD-10-CM | POA: Diagnosis not present

## 2023-02-18 DIAGNOSIS — I10 Essential (primary) hypertension: Secondary | ICD-10-CM | POA: Diagnosis not present

## 2023-02-18 DIAGNOSIS — M25551 Pain in right hip: Secondary | ICD-10-CM | POA: Diagnosis not present

## 2023-02-18 DIAGNOSIS — D649 Anemia, unspecified: Secondary | ICD-10-CM | POA: Diagnosis not present

## 2023-02-18 DIAGNOSIS — Z0000 Encounter for general adult medical examination without abnormal findings: Secondary | ICD-10-CM | POA: Diagnosis not present

## 2023-02-18 DIAGNOSIS — R3911 Hesitancy of micturition: Secondary | ICD-10-CM | POA: Diagnosis not present

## 2023-02-18 DIAGNOSIS — E7800 Pure hypercholesterolemia, unspecified: Secondary | ICD-10-CM | POA: Diagnosis not present

## 2023-03-02 ENCOUNTER — Other Ambulatory Visit: Payer: Self-pay | Admitting: Internal Medicine

## 2023-03-02 DIAGNOSIS — E785 Hyperlipidemia, unspecified: Secondary | ICD-10-CM

## 2023-03-04 ENCOUNTER — Ambulatory Visit: Payer: Medicare Other | Admitting: Internal Medicine

## 2023-03-07 ENCOUNTER — Other Ambulatory Visit: Payer: Medicare Other

## 2023-03-07 ENCOUNTER — Other Ambulatory Visit: Payer: Self-pay

## 2023-03-07 DIAGNOSIS — I10 Essential (primary) hypertension: Secondary | ICD-10-CM

## 2023-03-07 DIAGNOSIS — E782 Mixed hyperlipidemia: Secondary | ICD-10-CM

## 2023-03-08 LAB — LIPID PANEL
Chol/HDL Ratio: 2.9 ratio (ref 0.0–5.0)
Cholesterol, Total: 150 mg/dL (ref 100–199)
HDL: 52 mg/dL (ref >39)
LDL Chol Calc (NIH): 83 mg/dL (ref 0–99)
Triglycerides: 79 mg/dL (ref 0–149)
VLDL Cholesterol Cal: 15 mg/dL (ref 5–40)

## 2023-03-08 LAB — COMPREHENSIVE METABOLIC PANEL
ALT: 26 IU/L (ref 0–44)
AST: 17 IU/L (ref 0–40)
Albumin: 3.9 g/dL (ref 3.8–4.8)
Alkaline Phosphatase: 63 IU/L (ref 44–121)
BUN/Creatinine Ratio: 20 (ref 10–24)
BUN: 20 mg/dL (ref 8–27)
Bilirubin Total: 0.4 mg/dL (ref 0.0–1.2)
CO2: 26 mmol/L (ref 20–29)
Calcium: 9.7 mg/dL (ref 8.6–10.2)
Chloride: 104 mmol/L (ref 96–106)
Creatinine, Ser: 1 mg/dL (ref 0.76–1.27)
Globulin, Total: 2.7 g/dL (ref 1.5–4.5)
Glucose: 107 mg/dL — ABNORMAL HIGH (ref 70–99)
Potassium: 4.4 mmol/L (ref 3.5–5.2)
Sodium: 142 mmol/L (ref 134–144)
Total Protein: 6.6 g/dL (ref 6.0–8.5)
eGFR: 80 mL/min/{1.73_m2} (ref >59)

## 2023-03-11 ENCOUNTER — Encounter: Payer: Self-pay | Admitting: Internal Medicine

## 2023-03-11 ENCOUNTER — Ambulatory Visit (INDEPENDENT_AMBULATORY_CARE_PROVIDER_SITE_OTHER): Payer: Medicare Other | Admitting: Internal Medicine

## 2023-03-11 VITALS — BP 145/80 | HR 61 | Ht 73.0 in | Wt 216.8 lb

## 2023-03-11 DIAGNOSIS — E782 Mixed hyperlipidemia: Secondary | ICD-10-CM

## 2023-03-11 DIAGNOSIS — Z5189 Encounter for other specified aftercare: Secondary | ICD-10-CM | POA: Diagnosis not present

## 2023-03-11 DIAGNOSIS — I10 Essential (primary) hypertension: Secondary | ICD-10-CM | POA: Diagnosis not present

## 2023-03-11 DIAGNOSIS — Z1389 Encounter for screening for other disorder: Secondary | ICD-10-CM | POA: Diagnosis not present

## 2023-03-11 DIAGNOSIS — Z0001 Encounter for general adult medical examination with abnormal findings: Secondary | ICD-10-CM | POA: Diagnosis not present

## 2023-03-11 DIAGNOSIS — Z1331 Encounter for screening for depression: Secondary | ICD-10-CM | POA: Diagnosis not present

## 2023-03-11 NOTE — Progress Notes (Signed)
Established Patient Office Visit  Subjective:  Patient ID: Larry [name], male    DOB: 1951-02-18  Age: 72 y.o. MRN: [id number]  Chief Complaint  Patient presents with   Medicare Wellness    AWV    No new complaints, here for AWV refer to quality metrics and scanned documents.  Labs reviewed and notable for elevated fasting glucose and well controlled lipids.   No other concerns at this time.   Past Medical History:  Diagnosis Date   Abdominal pain    Chest pain    Fever    Generalized headaches    GERD (gastroesophageal reflux disease)    Granulomatous lung disease (HCC) 2012   Larry [name]/p R VATS   Hyperlipidemia    Hypertension    Multiple allergies    Nasal congestion     Past Surgical History:  Procedure Laterality Date   Drainage of pleural effusion.  11/16/2010   ENDOBRONCHIAL ULTRASOUND  11/16/2010   FIBEROPTIC BRONCHOSCOPY  11/16/2010   Dr Edwyna Shell, ytology from [facility] on Nov 16, 2010, right lung    Insertion of PleurX catheter.  11/16/2010   Pleural biopsies.  11/16/2010   Right VATS.  11/16/2010    Social History   Socioeconomic History   Marital status: Married    Spouse name: Not on file   Number of children: 3   Years of education: Not on file   Highest education level: Not on file  Occupational History   Occupation: Banker    Employer: [employer]  Tobacco Use   Smoking status: Never   Smokeless tobacco: Never  Vaping Use   Vaping status: Never Used  Substance and Sexual Activity   Alcohol use: Yes    Comment: occasional   Drug use: No   Sexual activity: Not on file  Other Topics Concern   Not on file  Social History Narrative   Not on file   Social Determinants of Health   Financial Resource Strain: Not on file  Food Insecurity: Not on file  Transportation Needs: Not on file  Physical Activity: Not on file  Stress: Not on file  Social Connections: Not on file  Intimate Partner Violence: Not on file    Family History   Problem Relation Age of Onset   Diabetes Mother    Deep vein thrombosis Mother    Allergies Son     No Known Allergies  Review of Systems  Constitutional: Negative.   HENT: Negative.    Eyes: Negative.   Respiratory: Negative.    Cardiovascular: Negative.   Gastrointestinal:  Positive for heartburn (rarely). Negative for constipation.  Genitourinary: Negative.   Musculoskeletal:  Positive for joint pain (right hip).  Skin: Negative.   Neurological: Negative.   Endo/Heme/Allergies: Negative.        Objective:   BP (!) 145/80   Pulse 61   Ht 6\' 1"  (1.854 m)   Wt 216 lb 12.8 oz (98.3 kg)   SpO2 98%   BMI 28.60 kg/m   Vitals:   03/11/23 1029  BP: (!) 145/80  Pulse: 61  Height: 6\' 1"  (1.854 m)  Weight: 216 lb 12.8 oz (98.3 kg)  SpO2: 98%  BMI (Calculated): 28.61    Physical Exam Vitals reviewed.  Constitutional:      Appearance: Normal appearance.  HENT:     Head: Normocephalic.     Left Ear: There is no impacted cerumen.     Nose: Nose normal.     Mouth/Throat:  Mouth: Mucous membranes are moist.     Pharynx: No posterior oropharyngeal erythema.  Eyes:     Extraocular Movements: Extraocular movements intact.     Pupils: Pupils are equal, round, and reactive to light.  Cardiovascular:     Rate and Rhythm: Regular rhythm.     Chest Wall: PMI is not displaced.     Pulses: Normal pulses.     Heart sounds: Normal heart sounds. No murmur heard. Pulmonary:     Effort: Pulmonary effort is normal.     Breath sounds: Normal air entry. No rhonchi or rales.  Abdominal:     General: Abdomen is flat. Bowel sounds are normal. There is no distension.     Palpations: Abdomen is soft. There is no hepatomegaly, splenomegaly or mass.     Tenderness: There is no abdominal tenderness.  Musculoskeletal:        General: Normal range of motion.     Cervical back: Normal range of motion and neck supple.     Right lower leg: No edema.     Left lower leg: No edema.   Skin:    General: Skin is warm and dry.  Neurological:     General: No focal deficit present.     Mental Status: He is alert and oriented to person, place, and time.     Cranial Nerves: No cranial nerve deficit.     Motor: No weakness.  Psychiatric:        Mood and Affect: Mood normal.        Behavior: Behavior normal.      No results found for any visits on 03/11/23.  Recent Results (from the past 2160 hour([name]))  Lipid panel     Status: None   Collection Time: 03/07/23  9:33 AM  Result Value Ref Range   Cholesterol, Total 150 100 - 199 mg/dL   Triglycerides 79 0 - 149 mg/dL   HDL 52 >91 mg/dL   VLDL Cholesterol Cal 15 5 - 40 mg/dL   LDL Chol Calc (NIH) 83 0 - 99 mg/dL   Chol/HDL Ratio 2.9 0.0 - 5.0 ratio    Comment:                                   T. Chol/HDL Ratio                                             Men  Women                               1/2 Avg.Risk  3.4    3.3                                   Avg.Risk  5.0    4.4                                2X Avg.Risk  9.6    7.1  3X Avg.Risk 23.4   11.0   Comprehensive metabolic panel     Status: Abnormal   Collection Time: 03/07/23  9:33 AM  Result Value Ref Range   Glucose 107 (H) 70 - 99 mg/dL   BUN 20 8 - 27 mg/dL   Creatinine, Ser 6.21 0.76 - 1.27 mg/dL   eGFR 80 >30 QM/VHQ/4.69   BUN/Creatinine Ratio 20 10 - 24   Sodium 142 134 - 144 mmol/L   Potassium 4.4 3.5 - 5.2 mmol/L   Chloride 104 96 - 106 mmol/L   CO2 26 20 - 29 mmol/L   Calcium 9.7 8.6 - 10.2 mg/dL   Total Protein 6.6 6.0 - 8.5 g/dL   Albumin 3.9 3.8 - 4.8 g/dL   Globulin, Total 2.7 1.5 - 4.5 g/dL   Bilirubin Total 0.4 0.0 - 1.2 mg/dL   Alkaline Phosphatase 63 44 - 121 IU/L   AST 17 0 - 40 IU/L   ALT 26 0 - 44 IU/L      Assessment & Plan:  As per problem list. Keep BP log and re-eval Problem List Items Addressed This Visit       Cardiovascular and Mediastinum   HTN (hypertension), benign - Primary      Other   HLD (hyperlipidemia)    Return in about 4 weeks (around 04/08/2023) for BP followup.   Total time spent: 40 minutes  [name], MD  03/11/2023   This document may have been prepared by [facility] Voice Recognition software and as such may include unintentional dictation errors.

## 2023-04-18 ENCOUNTER — Ambulatory Visit: Payer: Medicare Other | Admitting: Internal Medicine

## 2023-04-22 ENCOUNTER — Ambulatory Visit (INDEPENDENT_AMBULATORY_CARE_PROVIDER_SITE_OTHER): Payer: Medicare Other | Admitting: Internal Medicine

## 2023-04-22 VITALS — BP 120/70 | HR 60 | Ht 73.0 in | Wt 213.6 lb

## 2023-04-22 DIAGNOSIS — Z23 Encounter for immunization: Secondary | ICD-10-CM

## 2023-04-22 DIAGNOSIS — M25551 Pain in right hip: Secondary | ICD-10-CM | POA: Diagnosis not present

## 2023-04-22 DIAGNOSIS — I10 Essential (primary) hypertension: Secondary | ICD-10-CM | POA: Diagnosis not present

## 2023-04-22 MED ORDER — NAPROXEN 500 MG PO TABS: 500.0000 mg | ORAL_TABLET | Freq: Two times a day (BID) | ORAL | 1 refills | Status: DC | PRN

## 2023-04-22 NOTE — Progress Notes (Signed)
Established Patient Office Visit  Subjective:  Patient ID: Larry [name], male    DOB: 19-Dec-1950  Age: 72 y.o. MRN: [id number]  Chief Complaint  Patient presents with   Follow-up    1 mo f/u    Here for BP follow up and bp is now fully controlled. Initially experienced slight dizziness when he takes both antihypertensives in the am but now takes one two hrs later.    No other concerns at this time.   Past Medical History:  Diagnosis Date   Abdominal pain    Chest pain    Fever    Generalized headaches    GERD (gastroesophageal reflux disease)    Granulomatous lung disease (HCC) 2012   Rael Tilly/p R VATS   Hyperlipidemia    Hypertension    Multiple allergies    Nasal congestion     Past Surgical History:  Procedure Laterality Date   Drainage of pleural effusion.  11/16/2010   ENDOBRONCHIAL ULTRASOUND  11/16/2010   FIBEROPTIC BRONCHOSCOPY  11/16/2010   Dr Edwyna Shell, ytology from [facility] on Nov 16, 2010, right lung    Insertion of PleurX catheter.  11/16/2010   Pleural biopsies.  11/16/2010   Right VATS.  11/16/2010    Social History   Socioeconomic History   Marital status: Married    Spouse name: Not on file   Number of children: 3   Years of education: Not on file   Highest education level: Not on file  Occupational History   Occupation: Banker    Employer: [employer]  Tobacco Use   Smoking status: Never   Smokeless tobacco: Never  Vaping Use   Vaping status: Never Used  Substance and Sexual Activity   Alcohol use: Yes    Comment: occasional   Drug use: No   Sexual activity: Not on file  Other Topics Concern   Not on file  Social History Narrative   Not on file   Social Determinants of Health   Financial Resource Strain: Not on file  Food Insecurity: Not on file  Transportation Needs: Not on file  Physical Activity: Not on file  Stress: Not on file  Social Connections: Not on file  Intimate Partner Violence: Not on file    Family  History  Problem Relation Age of Onset   Diabetes Mother    Deep vein thrombosis Mother    Allergies Son     No Known Allergies  Review of Systems  Constitutional: Negative.   HENT: Negative.    Eyes: Negative.   Respiratory: Negative.    Cardiovascular: Negative.   Gastrointestinal:  Positive for heartburn (rarely). Negative for constipation.  Genitourinary: Negative.   Musculoskeletal:  Positive for joint pain (right hip).  Skin: Negative.   Neurological: Negative.   Endo/Heme/Allergies: Negative.        Objective:   BP 120/70   Pulse 60   Ht 6\' 1"  (1.854 m)   Wt 213 lb 9.6 oz (96.9 kg)   SpO2 96%   BMI 28.18 kg/m   Vitals:   04/22/23 1432  BP: 120/70  Pulse: 60  Height: 6\' 1"  (1.854 m)  Weight: 213 lb 9.6 oz (96.9 kg)  SpO2: 96%  BMI (Calculated): 28.19    Physical Exam Vitals reviewed.  Constitutional:      Appearance: Normal appearance.  HENT:     Head: Normocephalic.     Left Ear: There is no impacted cerumen.     Nose: Nose normal.  Mouth/Throat:     Mouth: Mucous membranes are moist.     Pharynx: No posterior oropharyngeal erythema.  Eyes:     Extraocular Movements: Extraocular movements intact.     Pupils: Pupils are equal, round, and reactive to light.  Cardiovascular:     Rate and Rhythm: Regular rhythm.     Chest Wall: PMI is not displaced.     Pulses: Normal pulses.     Heart sounds: Normal heart sounds. No murmur heard. Pulmonary:     Effort: Pulmonary effort is normal.     Breath sounds: Normal air entry. No rhonchi or rales.  Abdominal:     General: Abdomen is flat. Bowel sounds are normal. There is no distension.     Palpations: Abdomen is soft. There is no hepatomegaly, splenomegaly or mass.     Tenderness: There is no abdominal tenderness.  Musculoskeletal:        General: Normal range of motion.     Cervical back: Normal range of motion and neck supple.     Right lower leg: No edema.     Left lower leg: No edema.   Skin:    General: Skin is warm and dry.  Neurological:     General: No focal deficit present.     Mental Status: He is alert and oriented to person, place, and time.     Cranial Nerves: No cranial nerve deficit.     Motor: No weakness.  Psychiatric:        Mood and Affect: Mood normal.        Behavior: Behavior normal.      No results found for any visits on 04/22/23.  Recent Results (from the past 2160 hour(Mozetta Murfin))  Lipid panel     Status: None   Collection Time: 03/07/23  9:33 AM  Result Value Ref Range   Cholesterol, Total 150 100 - 199 mg/dL   Triglycerides 79 0 - 149 mg/dL   HDL 52 >16 mg/dL   VLDL Cholesterol Cal 15 5 - 40 mg/dL   LDL Chol Calc (NIH) 83 0 - 99 mg/dL   Chol/HDL Ratio 2.9 0.0 - 5.0 ratio    Comment:                                   T. Chol/HDL Ratio                                             Men  Women                               1/2 Avg.Risk  3.4    3.3                                   Avg.Risk  5.0    4.4                                2X Avg.Risk  9.6    7.1  3X Avg.Risk 23.4   11.0   Comprehensive metabolic panel     Status: Abnormal   Collection Time: 03/07/23  9:33 AM  Result Value Ref Range   Glucose 107 (H) 70 - 99 mg/dL   BUN 20 8 - 27 mg/dL   Creatinine, Ser 1.61 0.76 - 1.27 mg/dL   eGFR 80 >09 UE/AVW/0.98   BUN/Creatinine Ratio 20 10 - 24   Sodium 142 134 - 144 mmol/L   Potassium 4.4 3.5 - 5.2 mmol/L   Chloride 104 96 - 106 mmol/L   CO2 26 20 - 29 mmol/L   Calcium 9.7 8.6 - 10.2 mg/dL   Total Protein 6.6 6.0 - 8.5 g/dL   Albumin 3.9 3.8 - 4.8 g/dL   Globulin, Total 2.7 1.5 - 4.5 g/dL   Bilirubin Total 0.4 0.0 - 1.2 mg/dL   Alkaline Phosphatase 63 44 - 121 IU/L   AST 17 0 - 40 IU/L   ALT 26 0 - 44 IU/L      Assessment & Plan:  As per problem list  Problem List Items Addressed This Visit       Cardiovascular and Mediastinum   HTN (hypertension), benign - Primary     Musculoskeletal and  Integument   Pain of right hip joint   Relevant Medications   naproxen (NAPROSYN) 500 MG tablet   Other Visit Diagnoses     Needs flu shot       Relevant Orders   Flu Vaccine Trivalent High Dose (Fluad)       Return in about 6 weeks (around 06/03/2023) for fu with labs prior.   Total time spent: 20 minutes  [name], MD  04/22/2023   This document may have been prepared by [facility] Voice Recognition software and as such may include unintentional dictation errors.

## 2023-05-30 ENCOUNTER — Other Ambulatory Visit: Payer: Self-pay | Admitting: Internal Medicine

## 2023-05-30 ENCOUNTER — Other Ambulatory Visit: Payer: Medicare Other

## 2023-05-30 DIAGNOSIS — I10 Essential (primary) hypertension: Secondary | ICD-10-CM

## 2023-05-30 DIAGNOSIS — E782 Mixed hyperlipidemia: Secondary | ICD-10-CM | POA: Diagnosis not present

## 2023-05-31 LAB — LIPID PANEL
Chol/HDL Ratio: 3 {ratio} (ref 0.0–5.0)
Cholesterol, Total: 134 mg/dL (ref 100–199)
HDL: 45 mg/dL (ref >39)
LDL Chol Calc (NIH): 74 mg/dL (ref 0–99)
Triglycerides: 74 mg/dL (ref 0–149)
VLDL Cholesterol Cal: 15 mg/dL (ref 5–40)

## 2023-05-31 LAB — CMP14+EGFR
ALT: 17 [IU]/L (ref 0–44)
AST: 14 [IU]/L (ref 0–40)
Albumin: 3.9 g/dL (ref 3.8–4.8)
Alkaline Phosphatase: 52 [IU]/L (ref 44–121)
BUN/Creatinine Ratio: 21 (ref 10–24)
BUN: 23 mg/dL (ref 8–27)
Bilirubin Total: 0.5 mg/dL (ref 0.0–1.2)
CO2: 25 mmol/L (ref 20–29)
Calcium: 9.2 mg/dL (ref 8.6–10.2)
Chloride: 104 mmol/L (ref 96–106)
Creatinine, Ser: 1.1 mg/dL (ref 0.76–1.27)
Globulin, Total: 2.5 g/dL (ref 1.5–4.5)
Glucose: 102 mg/dL — ABNORMAL HIGH (ref 70–99)
Potassium: 4.7 mmol/L (ref 3.5–5.2)
Sodium: 142 mmol/L (ref 134–144)
Total Protein: 6.4 g/dL (ref 6.0–8.5)
eGFR: 71 mL/min/{1.73_m2} (ref >59)

## 2023-06-03 ENCOUNTER — Ambulatory Visit (INDEPENDENT_AMBULATORY_CARE_PROVIDER_SITE_OTHER): Payer: Medicare Other | Admitting: Internal Medicine

## 2023-06-03 VITALS — BP 141/84 | HR 72 | Ht 73.0 in | Wt 215.2 lb

## 2023-06-03 DIAGNOSIS — I10 Essential (primary) hypertension: Secondary | ICD-10-CM

## 2023-06-03 DIAGNOSIS — R7303 Prediabetes: Secondary | ICD-10-CM | POA: Insufficient documentation

## 2023-06-03 DIAGNOSIS — Z23 Encounter for immunization: Secondary | ICD-10-CM

## 2023-06-03 DIAGNOSIS — E782 Mixed hyperlipidemia: Secondary | ICD-10-CM | POA: Diagnosis not present

## 2023-06-03 NOTE — Progress Notes (Signed)
Established Patient Office Visit  Subjective:  Patient ID: Larry [name], male    DOB: 1951-06-18  Age: 72 y.o. MRN: [id number]  Chief Complaint  Patient presents with   Follow-up    No new complaints, here for lab review and medication refills. LDL and TC well controlled on lab review. Triglycerides also satisfactory and CMP only notable for elevated fasting glucose. Slightly anxious today as he was running late.  No other concerns at this time.   Past Medical History:  Diagnosis Date   Abdominal pain    Chest pain    Fever    Generalized headaches    GERD (gastroesophageal reflux disease)    Granulomatous lung disease (HCC) 2012   Haylo Fake/p R VATS   Hyperlipidemia    Hypertension    Multiple allergies    Nasal congestion     Past Surgical History:  Procedure Laterality Date   Drainage of pleural effusion.  11/16/2010   ENDOBRONCHIAL ULTRASOUND  11/16/2010   FIBEROPTIC BRONCHOSCOPY  11/16/2010   Dr Edwyna Shell, ytology from [facility] on Nov 16, 2010, right lung    Insertion of PleurX catheter.  11/16/2010   Pleural biopsies.  11/16/2010   Right VATS.  11/16/2010    Social History   Socioeconomic History   Marital status: Married    Spouse name: Not on file   Number of children: 3   Years of education: Not on file   Highest education level: Not on file  Occupational History   Occupation: Banker    Employer: [employer]  Tobacco Use   Smoking status: Never   Smokeless tobacco: Never  Vaping Use   Vaping status: Never Used  Substance and Sexual Activity   Alcohol use: Yes    Comment: occasional   Drug use: No   Sexual activity: Not on file  Other Topics Concern   Not on file  Social History Narrative   Not on file   Social Determinants of Health   Financial Resource Strain: Not on file  Food Insecurity: Not on file  Transportation Needs: Not on file  Physical Activity: Not on file  Stress: Not on file  Social Connections: Not on file  Intimate  Partner Violence: Not on file    Family History  Problem Relation Age of Onset   Diabetes Mother    Deep vein thrombosis Mother    Allergies Son     No Known Allergies  Outpatient Medications Prior to Visit  Medication Sig   atorvastatin (LIPITOR) 40 MG tablet TAKE 1 TABLET BY MOUTH DAILY AT  6 PM   carvedilol (COREG) 25 MG tablet Take 25 mg by mouth 2 (two) times daily with a meal.    [facility] IRON 325 (65 Fe) MG tablet Take 1 tablet by mouth 2 (two) times daily.   ezetimibe (ZETIA) 10 MG tablet TAKE 1 TABLET BY MOUTH EVERY DAY IN THE MORNING   fluticasone (FLONASE) 50 MCG/ACT nasal spray Place 2 sprays into both nostrils daily.   naproxen (NAPROSYN) 500 MG tablet Take 1 tablet (500 mg total) by mouth 2 (two) times daily as needed. With meals   pantoprazole (PROTONIX) 40 MG tablet Take 40 mg by mouth daily.   tamsulosin (FLOMAX) 0.4 MG CAPS capsule Take 0.4 mg by mouth daily.   valsartan-hydrochlorothiazide (DIOVAN-HCT) 320-12.5 MG tablet TAKE 1 TABLET BY MOUTH DAILY IN  THE MORNING   No facility-administered medications prior to visit.    Review of Systems  Constitutional: Negative.  HENT: Negative.    Eyes: Negative.   Respiratory: Negative.    Cardiovascular: Negative.   Gastrointestinal:  Positive for heartburn (rarely). Negative for constipation.  Genitourinary: Negative.   Musculoskeletal:  Positive for joint pain (right hip).  Skin: Negative.   Neurological: Negative.   Endo/Heme/Allergies: Negative.        Objective:   BP (!) 141/84   Pulse 72   Ht 6\' 1"  (1.854 m)   Wt 215 lb 3.2 oz (97.6 kg)   SpO2 96%   BMI 28.39 kg/m   Vitals:   06/03/23 1117  BP: (!) 141/84  Pulse: 72  Height: 6\' 1"  (1.854 m)  Weight: 215 lb 3.2 oz (97.6 kg)  SpO2: 96%  BMI (Calculated): 28.4    Physical Exam Vitals reviewed.  Constitutional:      Appearance: Normal appearance.  HENT:     Head: Normocephalic.     Left Ear: There is no impacted cerumen.     Nose: Nose  normal.     Mouth/Throat:     Mouth: Mucous membranes are moist.     Pharynx: No posterior oropharyngeal erythema.  Eyes:     Extraocular Movements: Extraocular movements intact.     Pupils: Pupils are equal, round, and reactive to light.  Cardiovascular:     Rate and Rhythm: Regular rhythm.     Chest Wall: PMI is not displaced.     Pulses: Normal pulses.     Heart sounds: Normal heart sounds. No murmur heard. Pulmonary:     Effort: Pulmonary effort is normal.     Breath sounds: Normal air entry. No rhonchi or rales.  Abdominal:     General: Abdomen is flat. Bowel sounds are normal. There is no distension.     Palpations: Abdomen is soft. There is no hepatomegaly, splenomegaly or mass.     Tenderness: There is no abdominal tenderness.  Musculoskeletal:        General: Normal range of motion.     Cervical back: Normal range of motion and neck supple.     Right lower leg: No edema.     Left lower leg: No edema.  Skin:    General: Skin is warm and dry.  Neurological:     General: No focal deficit present.     Mental Status: He is alert and oriented to person, place, and time.     Cranial Nerves: No cranial nerve deficit.     Motor: No weakness.  Psychiatric:        Mood and Affect: Mood normal.        Behavior: Behavior normal.      No results found for any visits on 06/03/23.  Recent Results (from the past 2160 hour(Kathalina Ostermann))  Lipid panel     Status: None   Collection Time: 03/07/23  9:33 AM  Result Value Ref Range   Cholesterol, Total 150 100 - 199 mg/dL   Triglycerides 79 0 - 149 mg/dL   HDL 52 >16 mg/dL   VLDL Cholesterol Cal 15 5 - 40 mg/dL   LDL Chol Calc (NIH) 83 0 - 99 mg/dL   Chol/HDL Ratio 2.9 0.0 - 5.0 ratio    Comment:                                   T. Chol/HDL Ratio  Men  Women                               1/2 Avg.Risk  3.4    3.3                                   Avg.Risk  5.0    4.4                                 2X Avg.Risk  9.6    7.1                                3X Avg.Risk 23.4   11.0   Comprehensive metabolic panel     Status: Abnormal   Collection Time: 03/07/23  9:33 AM  Result Value Ref Range   Glucose 107 (H) 70 - 99 mg/dL   BUN 20 8 - 27 mg/dL   Creatinine, Ser 9.62 0.76 - 1.27 mg/dL   eGFR 80 >95 MW/UXL/2.44   BUN/Creatinine Ratio 20 10 - 24   Sodium 142 134 - 144 mmol/L   Potassium 4.4 3.5 - 5.2 mmol/L   Chloride 104 96 - 106 mmol/L   CO2 26 20 - 29 mmol/L   Calcium 9.7 8.6 - 10.2 mg/dL   Total Protein 6.6 6.0 - 8.5 g/dL   Albumin 3.9 3.8 - 4.8 g/dL   Globulin, Total 2.7 1.5 - 4.5 g/dL   Bilirubin Total 0.4 0.0 - 1.2 mg/dL   Alkaline Phosphatase 63 44 - 121 IU/L   AST 17 0 - 40 IU/L   ALT 26 0 - 44 IU/L  CMP14+EGFR     Status: Abnormal   Collection Time: 05/30/23  1:06 PM  Result Value Ref Range   Glucose 102 (H) 70 - 99 mg/dL   BUN 23 8 - 27 mg/dL   Creatinine, Ser 0.10 0.76 - 1.27 mg/dL   eGFR 71 >27 OZ/DGU/4.40   BUN/Creatinine Ratio 21 10 - 24   Sodium 142 134 - 144 mmol/L   Potassium 4.7 3.5 - 5.2 mmol/L   Chloride 104 96 - 106 mmol/L   CO2 25 20 - 29 mmol/L   Calcium 9.2 8.6 - 10.2 mg/dL   Total Protein 6.4 6.0 - 8.5 g/dL   Albumin 3.9 3.8 - 4.8 g/dL   Globulin, Total 2.5 1.5 - 4.5 g/dL   Bilirubin Total 0.5 0.0 - 1.2 mg/dL   Alkaline Phosphatase 52 44 - 121 IU/L   AST 14 0 - 40 IU/L   ALT 17 0 - 44 IU/L  Lipid panel     Status: None   Collection Time: 05/30/23  1:06 PM  Result Value Ref Range   Cholesterol, Total 134 100 - 199 mg/dL   Triglycerides 74 0 - 149 mg/dL   HDL 45 >34 mg/dL   VLDL Cholesterol Cal 15 5 - 40 mg/dL   LDL Chol Calc (NIH) 74 0 - 99 mg/dL   Chol/HDL Ratio 3.0 0.0 - 5.0 ratio    Comment:                                   T. Chol/HDL Ratio  Men  Women                               1/2 Avg.Risk  3.4    3.3                                   Avg.Risk  5.0    4.4                                 2X Avg.Risk  9.6    7.1                                3X Avg.Risk 23.4   11.0       Assessment & Plan:  As per problem list.  Problem List Items Addressed This Visit       Cardiovascular and Mediastinum   Essential (primary) hypertension     Other   HLD (hyperlipidemia) - Primary   Relevant Orders   Zoster Recombinant (Shingrix )   Lipid panel   Comprehensive metabolic panel   Prediabetes   Relevant Orders   Hemoglobin A1c   Other Visit Diagnoses     Need for shingles vaccine       Relevant Orders   Zoster Recombinant (Shingrix )       Return in about 4 months (around 10/02/2023) for fu with labs prior.   Total time spent: 20 minutes  [name], MD  06/03/2023   This document may have been prepared by [facility] Voice Recognition software and as such may include unintentional dictation errors.

## 2023-06-30 ENCOUNTER — Other Ambulatory Visit: Payer: Self-pay | Admitting: Internal Medicine

## 2023-06-30 DIAGNOSIS — M25551 Pain in right hip: Secondary | ICD-10-CM

## 2023-07-02 DIAGNOSIS — Z79899 Other long term (current) drug therapy: Secondary | ICD-10-CM | POA: Diagnosis not present

## 2023-07-02 DIAGNOSIS — Z1211 Encounter for screening for malignant neoplasm of colon: Secondary | ICD-10-CM | POA: Diagnosis not present

## 2023-07-15 DIAGNOSIS — K635 Polyp of colon: Secondary | ICD-10-CM | POA: Diagnosis not present

## 2023-07-15 DIAGNOSIS — Z1211 Encounter for screening for malignant neoplasm of colon: Secondary | ICD-10-CM | POA: Diagnosis not present

## 2023-08-15 DIAGNOSIS — D126 Benign neoplasm of colon, unspecified: Secondary | ICD-10-CM | POA: Diagnosis not present

## 2023-08-15 DIAGNOSIS — K5730 Diverticulosis of large intestine without perforation or abscess without bleeding: Secondary | ICD-10-CM | POA: Diagnosis not present

## 2023-08-15 DIAGNOSIS — K648 Other hemorrhoids: Secondary | ICD-10-CM | POA: Diagnosis not present

## 2023-10-03 ENCOUNTER — Other Ambulatory Visit

## 2023-10-03 DIAGNOSIS — E782 Mixed hyperlipidemia: Secondary | ICD-10-CM | POA: Diagnosis not present

## 2023-10-04 LAB — LIPID PANEL
Chol/HDL Ratio: 2.7 ratio (ref 0.0–5.0)
Cholesterol, Total: 129 mg/dL (ref 100–199)
HDL: 48 mg/dL (ref >39)
LDL Chol Calc (NIH): 70 mg/dL (ref 0–99)
Triglycerides: 49 mg/dL (ref 0–149)
VLDL Cholesterol Cal: 11 mg/dL (ref 5–40)

## 2023-10-04 LAB — HEPATIC FUNCTION PANEL
ALT: 17 IU/L (ref 0–44)
AST: 16 IU/L (ref 0–40)
Albumin: 4 g/dL (ref 3.8–4.8)
Alkaline Phosphatase: 56 IU/L (ref 44–121)
Bilirubin Total: 0.5 mg/dL (ref 0.0–1.2)
Bilirubin, Direct: 0.19 mg/dL (ref 0.00–0.40)
Total Protein: 6.5 g/dL (ref 6.0–8.5)

## 2023-10-07 ENCOUNTER — Ambulatory Visit (INDEPENDENT_AMBULATORY_CARE_PROVIDER_SITE_OTHER): Payer: Medicare Other | Admitting: Internal Medicine

## 2023-10-07 ENCOUNTER — Encounter: Payer: Self-pay | Admitting: Internal Medicine

## 2023-10-07 DIAGNOSIS — R7303 Prediabetes: Secondary | ICD-10-CM | POA: Diagnosis not present

## 2023-10-07 DIAGNOSIS — I10 Essential (primary) hypertension: Secondary | ICD-10-CM

## 2023-10-07 DIAGNOSIS — M25551 Pain in right hip: Secondary | ICD-10-CM

## 2023-10-07 DIAGNOSIS — E782 Mixed hyperlipidemia: Secondary | ICD-10-CM | POA: Diagnosis not present

## 2023-10-07 MED ORDER — EZETIMIBE 10 MG PO TABS: 10.0000 mg | ORAL_TABLET | Freq: Every day | ORAL | 1 refills | Status: DC

## 2023-10-07 MED ORDER — VALSARTAN-HYDROCHLOROTHIAZIDE 320-12.5 MG PO TABS: ORAL_TABLET | ORAL | 2 refills | Status: DC

## 2023-10-07 NOTE — Progress Notes (Signed)
 Established Patient Office Visit  Subjective:  Patient ID: Larry [name], male    DOB: 24-Apr-1951  Age: 73 y.o. MRN: [id number]  Chief Complaint  Patient presents with   Follow-up    4 month follow up     No new complaints, here for lab review and medication refills. LDL and TC well controlled on lab review. Triglycerides also satisfactory with unremarkable lfts. BP remains well controlled with 2 lb weight loss.     No other concerns at this time.   Past Medical History:  Diagnosis Date   Abdominal pain    Chest pain    Fever    Generalized headaches    GERD (gastroesophageal reflux disease)    Granulomatous lung disease (HCC) 2012   Deavon Podgorski/p R VATS   Hyperlipidemia    Hypertension    Multiple allergies    Nasal congestion     Past Surgical History:  Procedure Laterality Date   Drainage of pleural effusion.  11/16/2010   ENDOBRONCHIAL ULTRASOUND  11/16/2010   FIBEROPTIC BRONCHOSCOPY  11/16/2010   Dr Edwyna Shell, ytology from [facility] on Nov 16, 2010, right lung    Insertion of PleurX catheter.  11/16/2010   Pleural biopsies.  11/16/2010   Right VATS.  11/16/2010    Social History   Socioeconomic History   Marital status: Married    Spouse name: Not on file   Number of children: 3   Years of education: Not on file   Highest education level: Not on file  Occupational History   Occupation: Banker    Employer: [employer]  Tobacco Use   Smoking status: Never   Smokeless tobacco: Never  Vaping Use   Vaping status: Never Used  Substance and Sexual Activity   Alcohol use: Yes    Comment: occasional   Drug use: No   Sexual activity: Not on file  Other Topics Concern   Not on file  Social History Narrative   Not on file   Social Drivers of Health   Financial Resource Strain: Not on file  Food Insecurity: Not on file  Transportation Needs: Not on file  Physical Activity: Not on file  Stress: Not on file  Social Connections: Not on file  Intimate  Partner Violence: Not on file    Family History  Problem Relation Age of Onset   Diabetes Mother    Deep vein thrombosis Mother    Allergies Son     No Known Allergies  Outpatient Medications Prior to Visit  Medication Sig   atorvastatin (LIPITOR) 40 MG tablet TAKE 1 TABLET BY MOUTH DAILY AT  6 PM   carvedilol (COREG) 25 MG tablet Take 25 mg by mouth 2 (two) times daily with a meal.    [facility] IRON 325 (65 Fe) MG tablet Take 1 tablet by mouth 2 (two) times daily.   fluticasone (FLONASE) 50 MCG/ACT nasal spray Place 2 sprays into both nostrils daily.   naproxen (NAPROSYN) 500 MG tablet TAKE 1 TABLET BY MOUTH TWICE  DAILY AS NEEDED WITH MEALS   pantoprazole (PROTONIX) 40 MG tablet Take 40 mg by mouth daily.   tamsulosin (FLOMAX) 0.4 MG CAPS capsule Take 0.4 mg by mouth daily.   [DISCONTINUED] ezetimibe (ZETIA) 10 MG tablet TAKE 1 TABLET BY MOUTH EVERY DAY IN THE MORNING   [DISCONTINUED] valsartan-hydrochlorothiazide (DIOVAN-HCT) 320-12.5 MG tablet TAKE 1 TABLET BY MOUTH DAILY IN  THE MORNING   No facility-administered medications prior to visit.    Review  of Systems  Constitutional: Negative.   HENT: Negative.    Eyes: Negative.   Respiratory: Negative.    Cardiovascular: Negative.   Gastrointestinal:  Positive for heartburn (rarely). Negative for constipation.  Genitourinary: Negative.   Musculoskeletal:  Positive for joint pain (right hip).  Skin: Negative.   Neurological: Negative.   Endo/Heme/Allergies: Negative.        Objective:   BP 130/78   Pulse 63   Temp 97.6 F (36.4 C)   Ht 6\' 1"  (1.854 m)   Wt 213 lb (96.6 kg)   SpO2 98%   BMI 28.10 kg/m   Vitals:   10/07/23 1046  BP: 130/78  Pulse: 63  Temp: 97.6 F (36.4 C)  Height: 6\' 1"  (1.854 m)  Weight: 213 lb (96.6 kg)  SpO2: 98%  BMI (Calculated): 28.11    Physical Exam Vitals reviewed.  Constitutional:      Appearance: Normal appearance.  HENT:     Head: Normocephalic.     Left Ear: There is no  impacted cerumen.     Nose: Nose normal.     Mouth/Throat:     Mouth: Mucous membranes are moist.     Pharynx: No posterior oropharyngeal erythema.  Eyes:     Extraocular Movements: Extraocular movements intact.     Pupils: Pupils are equal, round, and reactive to light.  Cardiovascular:     Rate and Rhythm: Regular rhythm.     Chest Wall: PMI is not displaced.     Pulses: Normal pulses.     Heart sounds: Normal heart sounds. No murmur heard. Pulmonary:     Effort: Pulmonary effort is normal.     Breath sounds: Normal air entry. No rhonchi or rales.  Abdominal:     General: Abdomen is flat. Bowel sounds are normal. There is no distension.     Palpations: Abdomen is soft. There is no hepatomegaly, splenomegaly or mass.     Tenderness: There is no abdominal tenderness.  Musculoskeletal:        General: Normal range of motion.     Cervical back: Normal range of motion and neck supple.     Right lower leg: No edema.     Left lower leg: No edema.  Skin:    General: Skin is warm and dry.  Neurological:     General: No focal deficit present.     Mental Status: He is alert and oriented to person, place, and time.     Cranial Nerves: No cranial nerve deficit.     Motor: No weakness.  Psychiatric:        Mood and Affect: Mood normal.        Behavior: Behavior normal.      No results found for any visits on 10/07/23.  Recent Results (from the past 2160 hours)  Lipid panel     Status: None   Collection Time: 10/03/23  8:59 AM  Result Value Ref Range   Cholesterol, Total 129 100 - 199 mg/dL   Triglycerides 49 0 - 149 mg/dL   HDL 48 >41 mg/dL   VLDL Cholesterol Cal 11 5 - 40 mg/dL   LDL Chol Calc (NIH) 70 0 - 99 mg/dL   Chol/HDL Ratio 2.7 0.0 - 5.0 ratio    Comment:                                   T. Chol/HDL Ratio  Men  Women                               1/2 Avg.Risk  3.4    3.3                                   Avg.Risk  5.0     4.4                                2X Avg.Risk  9.6    7.1                                3X Avg.Risk 23.4   11.0   Hepatic function panel     Status: None   Collection Time: 10/03/23  8:59 AM  Result Value Ref Range   Total Protein 6.5 6.0 - 8.5 g/dL   Albumin 4.0 3.8 - 4.8 g/dL   Bilirubin Total 0.5 0.0 - 1.2 mg/dL   Bilirubin, Direct 1.61 0.00 - 0.40 mg/dL   Alkaline Phosphatase 56 44 - 121 IU/L   AST 16 0 - 40 IU/L   ALT 17 0 - 44 IU/L      Assessment & Plan:  As per problem list  Problem List Items Addressed This Visit       Cardiovascular and Mediastinum   Essential (primary) hypertension   Relevant Medications   ezetimibe (ZETIA) 10 MG tablet   valsartan-hydrochlorothiazide (DIOVAN-HCT) 320-12.5 MG tablet     Other   HLD (hyperlipidemia)   Relevant Medications   ezetimibe (ZETIA) 10 MG tablet   valsartan-hydrochlorothiazide (DIOVAN-HCT) 320-12.5 MG tablet   Pain of right hip joint   Prediabetes   Other Visit Diagnoses       Hyperlipidemia, unspecified       Relevant Medications   ezetimibe (ZETIA) 10 MG tablet   valsartan-hydrochlorothiazide (DIOVAN-HCT) 320-12.5 MG tablet       Return in about 3 months (around 01/06/2024) for fu with labs prior.   Total time spent: 20 minutes  Arzella Bitters, MD  10/07/2023   This document may have been prepared by [facility] Voice Recognition software and as such may include unintentional dictation errors.

## 2024-01-09 ENCOUNTER — Other Ambulatory Visit: Payer: Self-pay | Admitting: Internal Medicine

## 2024-01-09 ENCOUNTER — Ambulatory Visit: Admitting: Internal Medicine

## 2024-01-09 ENCOUNTER — Other Ambulatory Visit

## 2024-01-09 DIAGNOSIS — R7303 Prediabetes: Secondary | ICD-10-CM | POA: Diagnosis not present

## 2024-01-09 DIAGNOSIS — E782 Mixed hyperlipidemia: Secondary | ICD-10-CM | POA: Diagnosis not present

## 2024-01-10 LAB — LIPID PANEL
Chol/HDL Ratio: 2.8 ratio (ref 0.0–5.0)
Cholesterol, Total: 136 mg/dL (ref 100–199)
HDL: 49 mg/dL (ref >39)
LDL Chol Calc (NIH): 74 mg/dL (ref 0–99)
Triglycerides: 63 mg/dL (ref 0–149)
VLDL Cholesterol Cal: 13 mg/dL (ref 5–40)

## 2024-01-10 LAB — HEMOGLOBIN A1C
Est. average glucose Bld gHb Est-mCnc: 117 mg/dL
Hgb A1c MFr Bld: 5.7 % — ABNORMAL HIGH (ref 4.8–5.6)

## 2024-01-10 LAB — COMPREHENSIVE METABOLIC PANEL WITH GFR
ALT: 19 IU/L (ref 0–44)
AST: 15 IU/L (ref 0–40)
Albumin: 4 g/dL (ref 3.8–4.8)
Alkaline Phosphatase: 60 IU/L (ref 44–121)
BUN/Creatinine Ratio: 24 (ref 10–24)
BUN: 27 mg/dL (ref 8–27)
Bilirubin Total: 0.5 mg/dL (ref 0.0–1.2)
CO2: 17 mmol/L — ABNORMAL LOW (ref 20–29)
Calcium: 9.5 mg/dL (ref 8.6–10.2)
Chloride: 106 mmol/L (ref 96–106)
Creatinine, Ser: 1.12 mg/dL (ref 0.76–1.27)
Globulin, Total: 2.6 g/dL (ref 1.5–4.5)
Glucose: 94 mg/dL (ref 70–99)
Potassium: 4 mmol/L (ref 3.5–5.2)
Sodium: 143 mmol/L (ref 134–144)
Total Protein: 6.6 g/dL (ref 6.0–8.5)
eGFR: 69 mL/min/1.73 (ref >59)

## 2024-01-13 ENCOUNTER — Ambulatory Visit: Admitting: Internal Medicine

## 2024-01-16 ENCOUNTER — Ambulatory Visit: Payer: Self-pay | Admitting: Internal Medicine

## 2024-01-16 ENCOUNTER — Ambulatory Visit (INDEPENDENT_AMBULATORY_CARE_PROVIDER_SITE_OTHER): Admitting: Internal Medicine

## 2024-01-16 VITALS — BP 128/62 | HR 70 | Temp 97.2°F | Ht 73.0 in | Wt 216.0 lb

## 2024-01-16 DIAGNOSIS — E872 Acidosis, unspecified: Secondary | ICD-10-CM

## 2024-01-16 DIAGNOSIS — E782 Mixed hyperlipidemia: Secondary | ICD-10-CM | POA: Diagnosis not present

## 2024-01-16 DIAGNOSIS — I10 Essential (primary) hypertension: Secondary | ICD-10-CM

## 2024-01-16 DIAGNOSIS — K219 Gastro-esophageal reflux disease without esophagitis: Secondary | ICD-10-CM

## 2024-01-16 DIAGNOSIS — N401 Enlarged prostate with lower urinary tract symptoms: Secondary | ICD-10-CM | POA: Diagnosis not present

## 2024-01-16 DIAGNOSIS — R351 Nocturia: Secondary | ICD-10-CM

## 2024-01-16 DIAGNOSIS — R7303 Prediabetes: Secondary | ICD-10-CM | POA: Diagnosis not present

## 2024-01-16 MED ORDER — PANTOPRAZOLE SODIUM 40 MG PO TBEC: 40.0000 mg | DELAYED_RELEASE_TABLET | ORAL | 2 refills | Status: DC

## 2024-01-16 NOTE — Progress Notes (Signed)
 Established Patient Office Visit  Subjective:  Patient ID: Larry [name], male    DOB: 1951/03/05  Age: 73 y.o. MRN: [id number]  Chief Complaint  Patient presents with   Follow-up    3 months lab results    No new complaints, here for lab review and medication refills. BP remains well controlled. LDL and TC well controlled on lab review. Triglycerides also satisfactory but CMP notable for AG acidosis. A1c improved to 5.7 despite weight gain. Admits to otc use of Alka Seltzer nearly daily.       No other concerns at this time.   Past Medical History:  Diagnosis Date   Abdominal pain    Chest pain    Fever    Generalized headaches    GERD (gastroesophageal reflux disease)    Granulomatous lung disease (HCC) 2012   Mayli Covington/p R VATS   Hyperlipidemia    Hypertension    Multiple allergies    Nasal congestion     Past Surgical History:  Procedure Laterality Date   Drainage of pleural effusion.  11/16/2010   ENDOBRONCHIAL ULTRASOUND  11/16/2010   FIBEROPTIC BRONCHOSCOPY  11/16/2010   Dr Brantley, ytology from [facility] on Nov 16, 2010, right lung    Insertion of PleurX catheter.  11/16/2010   Pleural biopsies.  11/16/2010   Right VATS.  11/16/2010    Social History   Socioeconomic History   Marital status: Married    Spouse name: Not on file   Number of children: 3   Years of education: Not on file   Highest education level: Not on file  Occupational History   Occupation: Banker    Employer: [employer]  Tobacco Use   Smoking status: Never   Smokeless tobacco: Never  Vaping Use   Vaping status: Never Used  Substance and Sexual Activity   Alcohol use: Yes    Comment: occasional   Drug use: No   Sexual activity: Not on file  Other Topics Concern   Not on file  Social History Narrative   Not on file   Social Drivers of Health   Financial Resource Strain: Not on file  Food Insecurity: Not on file  Transportation Needs: Not on file  Physical Activity:  Not on file  Stress: Not on file  Social Connections: Not on file  Intimate Partner Violence: Not on file    Family History  Problem Relation Age of Onset   Diabetes Mother    Deep vein thrombosis Mother    Allergies Son     No Known Allergies  Outpatient Medications Prior to Visit  Medication Sig   atorvastatin  (LIPITOR) 40 MG tablet TAKE 1 TABLET BY MOUTH DAILY AT  6 PM   carvedilol  (COREG ) 25 MG tablet Take 25 mg by mouth 2 (two) times daily with a meal.    [facility] IRON 325 (65 Fe) MG tablet Take 1 tablet by mouth 2 (two) times daily.   ezetimibe  (ZETIA ) 10 MG tablet Take 1 tablet (10 mg total) by mouth daily.   fluticasone (FLONASE) 50 MCG/ACT nasal spray Place 2 sprays into both nostrils daily.   naproxen  (NAPROSYN ) 500 MG tablet TAKE 1 TABLET BY MOUTH TWICE  DAILY AS NEEDED WITH MEALS   tamsulosin  (FLOMAX ) 0.4 MG CAPS capsule Take 0.4 mg by mouth daily.   valsartan -hydrochlorothiazide  (DIOVAN -HCT) 320-12.5 MG tablet TAKE 1 TABLET BY MOUTH DAILY IN  THE MORNING   [DISCONTINUED] pantoprazole  (PROTONIX ) 40 MG tablet Take 40 mg by mouth  daily.   No facility-administered medications prior to visit.    Review of Systems  Constitutional: Negative.  Negative for weight loss (gained 3 lbs).  HENT: Negative.    Eyes: Negative.   Respiratory: Negative.    Cardiovascular: Negative.   Gastrointestinal:  Positive for heartburn (rarely). Negative for constipation.  Genitourinary: Negative.   Musculoskeletal:  Positive for joint pain (right hip).  Skin: Negative.   Neurological: Negative.   Endo/Heme/Allergies: Negative.        Objective:   BP 128/62   Pulse 70   Temp (!) 97.2 F (36.2 C) (Tympanic)   Ht 6' 1 (1.854 m)   Wt 216 lb (98 kg)   SpO2 95%   BMI 28.50 kg/m   Vitals:   01/16/24 1026  BP: 128/62  Pulse: 70  Temp: (!) 97.2 F (36.2 C)  Height: 6' 1 (1.854 m)  Weight: 216 lb (98 kg)  SpO2: 95%  TempSrc: Tympanic  BMI (Calculated): 28.5    Physical  Exam Vitals reviewed.  Constitutional:      Appearance: Normal appearance.  HENT:     Head: Normocephalic.     Left Ear: There is no impacted cerumen.     Nose: Nose normal.     Mouth/Throat:     Mouth: Mucous membranes are moist.     Pharynx: No posterior oropharyngeal erythema.  Eyes:     Extraocular Movements: Extraocular movements intact.     Pupils: Pupils are equal, round, and reactive to light.  Cardiovascular:     Rate and Rhythm: Regular rhythm.     Chest Wall: PMI is not displaced.     Pulses: Normal pulses.     Heart sounds: Normal heart sounds. No murmur heard. Pulmonary:     Effort: Pulmonary effort is normal.     Breath sounds: Normal air entry. No rhonchi or rales.  Abdominal:     General: Abdomen is flat. Bowel sounds are normal. There is no distension.     Palpations: Abdomen is soft. There is no hepatomegaly, splenomegaly or mass.     Tenderness: There is no abdominal tenderness.  Musculoskeletal:        General: Normal range of motion.     Cervical back: Normal range of motion and neck supple.     Right lower leg: No edema.     Left lower leg: No edema.  Skin:    General: Skin is warm and dry.  Neurological:     General: No focal deficit present.     Mental Status: He is alert and oriented to person, place, and time.     Cranial Nerves: No cranial nerve deficit.     Motor: No weakness.  Psychiatric:        Mood and Affect: Mood normal.        Behavior: Behavior normal.      No results found for any visits on 01/16/24.  Recent Results (from the past 2160 hours)  Comprehensive metabolic panel with GFR     Status: Abnormal   Collection Time: 01/09/24  9:42 AM  Result Value Ref Range   Glucose 94 70 - 99 mg/dL   BUN 27 8 - 27 mg/dL   Creatinine, Ser 8.87 0.76 - 1.27 mg/dL   eGFR 69 >40 fO/fpw/8.26   BUN/Creatinine Ratio 24 10 - 24   Sodium 143 134 - 144 mmol/L   Potassium 4.0 3.5 - 5.2 mmol/L   Chloride 106 96 - 106 mmol/L  CO2 17 (L) 20 - 29  mmol/L   Calcium  9.5 8.6 - 10.2 mg/dL   Total Protein 6.6 6.0 - 8.5 g/dL   Albumin 4.0 3.8 - 4.8 g/dL   Globulin, Total 2.6 1.5 - 4.5 g/dL   Bilirubin Total 0.5 0.0 - 1.2 mg/dL   Alkaline Phosphatase 60 44 - 121 IU/L   AST 15 0 - 40 IU/L   ALT 19 0 - 44 IU/L  Lipid panel     Status: None   Collection Time: 01/09/24  9:42 AM  Result Value Ref Range   Cholesterol, Total 136 100 - 199 mg/dL   Triglycerides 63 0 - 149 mg/dL   HDL 49 >60 mg/dL   VLDL Cholesterol Cal 13 5 - 40 mg/dL   LDL Chol Calc (NIH) 74 0 - 99 mg/dL   Chol/HDL Ratio 2.8 0.0 - 5.0 ratio    Comment:                                   T. Chol/HDL Ratio                                             Men  Women                               1/2 Avg.Risk  3.4    3.3                                   Avg.Risk  5.0    4.4                                2X Avg.Risk  9.6    7.1                                3X Avg.Risk 23.4   11.0   Hemoglobin A1c     Status: Abnormal   Collection Time: 01/09/24  9:42 AM  Result Value Ref Range   Hgb A1c MFr Bld 5.7 (H) 4.8 - 5.6 %    Comment:          Prediabetes: 5.7 - 6.4          Diabetes: >6.4          Glycemic control for adults with diabetes: <7.0    Est. average glucose Bld gHb Est-mCnc 117 mg/dL      Assessment & Plan:  As per problem list. Rpt BMP in 2 wks If CO2 remains low, order additional studies to rule out gut ischemia. Problem List Items Addressed This Visit       Cardiovascular and Mediastinum   Essential (primary) hypertension   Relevant Orders   CBC With Diff/Platelet     Genitourinary   BPH (benign prostatic hyperplasia)   Relevant Orders   PSA     Other   HLD (hyperlipidemia)   Relevant Orders   Comprehensive metabolic panel with GFR   Lipid panel   CK   Prediabetes   Other Visit Diagnoses  Metabolic acidosis    -  Primary   Relevant Orders   BMP8+Anion Gap     Gastroesophageal reflux disease without esophagitis       Relevant Medications    pantoprazole  (PROTONIX ) 40 MG tablet       Return in about 3 months (around 04/17/2024) for awv with labs prior.   Total time spent: 20 minutes  Sherrill Cinderella Perry, MD  01/16/2024   This document may have been prepared by [facility] Voice Recognition software and as such may include unintentional dictation errors.

## 2024-01-27 ENCOUNTER — Other Ambulatory Visit

## 2024-01-27 DIAGNOSIS — E782 Mixed hyperlipidemia: Secondary | ICD-10-CM | POA: Diagnosis not present

## 2024-01-27 DIAGNOSIS — E872 Acidosis, unspecified: Secondary | ICD-10-CM | POA: Diagnosis not present

## 2024-01-27 DIAGNOSIS — I10 Essential (primary) hypertension: Secondary | ICD-10-CM | POA: Diagnosis not present

## 2024-01-28 LAB — CBC WITH DIFF/PLATELET
Basophils Absolute: 0 x10E3/uL (ref 0.0–0.2)
Basos: 1 %
EOS (ABSOLUTE): 1.2 x10E3/uL — ABNORMAL HIGH (ref 0.0–0.4)
Eos: 21 %
Hematocrit: 38.7 % (ref 37.5–51.0)
Hemoglobin: 12.7 g/dL — ABNORMAL LOW (ref 13.0–17.7)
Immature Grans (Abs): 0 x10E3/uL (ref 0.0–0.1)
Immature Granulocytes: 0 %
Lymphocytes Absolute: 1.8 x10E3/uL (ref 0.7–3.1)
Lymphs: 32 %
MCH: 30.5 pg (ref 26.6–33.0)
MCHC: 32.8 g/dL (ref 31.5–35.7)
MCV: 93 fL (ref 79–97)
Monocytes Absolute: 0.9 x10E3/uL (ref 0.1–0.9)
Monocytes: 15 %
Neutrophils Absolute: 1.8 x10E3/uL (ref 1.4–7.0)
Neutrophils: 31 %
Platelets: 123 x10E3/uL — ABNORMAL LOW (ref 150–450)
RBC: 4.16 x10E6/uL (ref 4.14–5.80)
RDW: 12.9 % (ref 11.6–15.4)
WBC: 5.8 x10E3/uL (ref 3.4–10.8)

## 2024-01-28 LAB — COMPREHENSIVE METABOLIC PANEL WITH GFR
ALT: 16 IU/L (ref 0–44)
AST: 20 IU/L (ref 0–40)
Albumin: 4.1 g/dL (ref 3.8–4.8)
Alkaline Phosphatase: 62 IU/L (ref 44–121)
BUN/Creatinine Ratio: 24 (ref 10–24)
BUN: 29 mg/dL — ABNORMAL HIGH (ref 8–27)
Bilirubin Total: 0.6 mg/dL (ref 0.0–1.2)
CO2: 23 mmol/L (ref 20–29)
Calcium: 9.4 mg/dL (ref 8.6–10.2)
Chloride: 105 mmol/L (ref 96–106)
Creatinine, Ser: 1.21 mg/dL (ref 0.76–1.27)
Globulin, Total: 2.8 g/dL (ref 1.5–4.5)
Glucose: 102 mg/dL — ABNORMAL HIGH (ref 70–99)
Potassium: 4.5 mmol/L (ref 3.5–5.2)
Sodium: 140 mmol/L (ref 134–144)
Total Protein: 6.9 g/dL (ref 6.0–8.5)
eGFR: 63 mL/min/1.73 (ref >59)

## 2024-01-28 LAB — LIPID PANEL
Chol/HDL Ratio: 3.1 ratio (ref 0.0–5.0)
Cholesterol, Total: 126 mg/dL (ref 100–199)
HDL: 41 mg/dL (ref >39)
LDL Chol Calc (NIH): 74 mg/dL (ref 0–99)
Triglycerides: 50 mg/dL (ref 0–149)
VLDL Cholesterol Cal: 11 mg/dL (ref 5–40)

## 2024-01-28 LAB — BMP8+ANION GAP
Anion Gap: 16 mmol/L (ref 10.0–18.0)
BUN/Creatinine Ratio: 23 (ref 10–24)
BUN: 27 mg/dL (ref 8–27)
CO2: 21 mmol/L (ref 20–29)
Calcium: 9.4 mg/dL (ref 8.6–10.2)
Chloride: 104 mmol/L (ref 96–106)
Creatinine, Ser: 1.17 mg/dL (ref 0.76–1.27)
Glucose: 100 mg/dL — ABNORMAL HIGH (ref 70–99)
Potassium: 4.5 mmol/L (ref 3.5–5.2)
Sodium: 141 mmol/L (ref 134–144)
eGFR: 66 mL/min/1.73 (ref >59)

## 2024-01-28 LAB — PSA: Prostate Specific Ag, Serum: 11.9 ng/mL — ABNORMAL HIGH (ref 0.0–4.0)

## 2024-02-03 ENCOUNTER — Ambulatory Visit: Payer: Self-pay | Admitting: Internal Medicine

## 2024-02-04 ENCOUNTER — Other Ambulatory Visit: Payer: Self-pay | Admitting: Internal Medicine

## 2024-02-04 DIAGNOSIS — E782 Mixed hyperlipidemia: Secondary | ICD-10-CM

## 2024-02-25 DIAGNOSIS — E7800 Pure hypercholesterolemia, unspecified: Secondary | ICD-10-CM | POA: Diagnosis not present

## 2024-02-25 DIAGNOSIS — R21 Rash and other nonspecific skin eruption: Secondary | ICD-10-CM | POA: Diagnosis not present

## 2024-02-25 DIAGNOSIS — M25551 Pain in right hip: Secondary | ICD-10-CM | POA: Diagnosis not present

## 2024-02-25 DIAGNOSIS — Z0000 Encounter for general adult medical examination without abnormal findings: Secondary | ICD-10-CM | POA: Diagnosis not present

## 2024-02-25 DIAGNOSIS — R3911 Hesitancy of micturition: Secondary | ICD-10-CM | POA: Diagnosis not present

## 2024-02-25 DIAGNOSIS — B0229 Other postherpetic nervous system involvement: Secondary | ICD-10-CM | POA: Diagnosis not present

## 2024-02-25 DIAGNOSIS — Z23 Encounter for immunization: Secondary | ICD-10-CM | POA: Diagnosis not present

## 2024-02-25 DIAGNOSIS — D696 Thrombocytopenia, unspecified: Secondary | ICD-10-CM | POA: Diagnosis not present

## 2024-02-25 DIAGNOSIS — I10 Essential (primary) hypertension: Secondary | ICD-10-CM | POA: Diagnosis not present

## 2024-03-22 ENCOUNTER — Other Ambulatory Visit: Payer: Self-pay

## 2024-03-22 DIAGNOSIS — K219 Gastro-esophageal reflux disease without esophagitis: Secondary | ICD-10-CM

## 2024-03-22 MED ORDER — PANTOPRAZOLE SODIUM 40 MG PO TBEC
40.0000 mg | DELAYED_RELEASE_TABLET | ORAL | 1 refills | Status: DC
Start: 2024-03-22 — End: 2024-04-14

## 2024-04-14 ENCOUNTER — Other Ambulatory Visit: Payer: Self-pay | Admitting: Internal Medicine

## 2024-04-14 DIAGNOSIS — K219 Gastro-esophageal reflux disease without esophagitis: Secondary | ICD-10-CM

## 2024-04-16 ENCOUNTER — Other Ambulatory Visit

## 2024-04-16 DIAGNOSIS — E782 Mixed hyperlipidemia: Secondary | ICD-10-CM

## 2024-04-16 DIAGNOSIS — R7303 Prediabetes: Secondary | ICD-10-CM | POA: Diagnosis not present

## 2024-04-16 LAB — HEMOGLOBIN A1C
Est. average glucose Bld gHb Est-mCnc: 123 mg/dL
Hgb A1c MFr Bld: 5.9 % — ABNORMAL HIGH (ref 4.8–5.6)

## 2024-04-17 LAB — COMPREHENSIVE METABOLIC PANEL WITH GFR
ALT: 20 IU/L (ref 0–44)
AST: 17 IU/L (ref 0–40)
Albumin: 3.9 g/dL (ref 3.8–4.8)
Alkaline Phosphatase: 67 IU/L (ref 47–123)
BUN/Creatinine Ratio: 24 (ref 10–24)
BUN: 27 mg/dL (ref 8–27)
Bilirubin Total: 0.4 mg/dL (ref 0.0–1.2)
CO2: 25 mmol/L (ref 20–29)
Calcium: 9.5 mg/dL (ref 8.6–10.2)
Chloride: 104 mmol/L (ref 96–106)
Creatinine, Ser: 1.11 mg/dL (ref 0.76–1.27)
Globulin, Total: 2.6 g/dL (ref 1.5–4.5)
Glucose: 120 mg/dL — ABNORMAL HIGH (ref 70–99)
Potassium: 4.1 mmol/L (ref 3.5–5.2)
Sodium: 140 mmol/L (ref 134–144)
Total Protein: 6.5 g/dL (ref 6.0–8.5)
eGFR: 70 mL/min/1.73 (ref >59)

## 2024-04-17 LAB — LIPID PANEL
Chol/HDL Ratio: 4.5 ratio (ref 0.0–5.0)
Cholesterol, Total: 289 mg/dL — ABNORMAL HIGH (ref 100–199)
HDL: 64 mg/dL (ref >39)
LDL Chol Calc (NIH): 183 mg/dL — ABNORMAL HIGH (ref 0–99)
Triglycerides: 222 mg/dL — ABNORMAL HIGH (ref 0–149)
VLDL Cholesterol Cal: 42 mg/dL — ABNORMAL HIGH (ref 5–40)

## 2024-04-17 LAB — CK: Total CK: 179 U/L (ref 41–331)

## 2024-04-20 ENCOUNTER — Ambulatory Visit: Payer: Self-pay | Admitting: Internal Medicine

## 2024-04-20 ENCOUNTER — Ambulatory Visit: Admitting: Internal Medicine

## 2024-04-20 ENCOUNTER — Encounter: Payer: Self-pay | Admitting: Internal Medicine

## 2024-04-20 VITALS — BP 124/75 | HR 63 | Temp 97.7°F | Ht 73.0 in | Wt 216.8 lb

## 2024-04-20 DIAGNOSIS — I10 Essential (primary) hypertension: Secondary | ICD-10-CM | POA: Diagnosis not present

## 2024-04-20 DIAGNOSIS — Z0001 Encounter for general adult medical examination with abnormal findings: Secondary | ICD-10-CM | POA: Diagnosis not present

## 2024-04-20 DIAGNOSIS — R7303 Prediabetes: Secondary | ICD-10-CM

## 2024-04-20 DIAGNOSIS — E782 Mixed hyperlipidemia: Secondary | ICD-10-CM | POA: Diagnosis not present

## 2024-04-20 DIAGNOSIS — Z1331 Encounter for screening for depression: Secondary | ICD-10-CM

## 2024-04-20 DIAGNOSIS — Z1389 Encounter for screening for other disorder: Secondary | ICD-10-CM

## 2024-04-20 DIAGNOSIS — S80811A Abrasion, right lower leg, initial encounter: Secondary | ICD-10-CM | POA: Diagnosis not present

## 2024-04-20 DIAGNOSIS — S43402A Unspecified sprain of left shoulder joint, initial encounter: Secondary | ICD-10-CM | POA: Insufficient documentation

## 2024-04-20 DIAGNOSIS — Z23 Encounter for immunization: Secondary | ICD-10-CM | POA: Diagnosis not present

## 2024-04-20 DIAGNOSIS — S80819A Abrasion, unspecified lower leg, initial encounter: Secondary | ICD-10-CM | POA: Insufficient documentation

## 2024-04-20 LAB — POCT CBG (FASTING - GLUCOSE)-MANUAL ENTRY: Glucose Fasting, POC: 109 mg/dL — AB (ref 70–99)

## 2024-04-20 MED ORDER — VALSARTAN-HYDROCHLOROTHIAZIDE 320-12.5 MG PO TABS: ORAL_TABLET | ORAL | 2 refills | Status: AC

## 2024-04-20 NOTE — Progress Notes (Signed)
 Established Patient Office Visit  Subjective:  Patient ID: Larry [name], male    DOB: 09-21-1950  Age: 73 y.o. MRN: [id number]  Chief Complaint  Patient presents with   Annual Exam    3 month w/ lab results. AWV    No new complaints, here for AWV refer to quality metrics and scanned documents.   Labs reviewed and notable for deterioration in TC and LDL. Glucose and A1c remain in the prediabetic range.    No other concerns at this time.   Past Medical History:  Diagnosis Date   Abdominal pain    Chest pain    Fever    Generalized headaches    GERD (gastroesophageal reflux disease)    Granulomatous lung disease (HCC) 2012   Jove Beyl/p R VATS   Hyperlipidemia    Hypertension    Multiple allergies    Nasal congestion     Past Surgical History:  Procedure Laterality Date   Drainage of pleural effusion.  11/16/2010   ENDOBRONCHIAL ULTRASOUND  11/16/2010   FIBEROPTIC BRONCHOSCOPY  11/16/2010   Dr Brantley, ytology from [facility] on Nov 16, 2010, right lung    Insertion of PleurX catheter.  11/16/2010   Pleural biopsies.  11/16/2010   Right VATS.  11/16/2010    Social History   Socioeconomic History   Marital status: Married    Spouse name: Not on file   Number of children: 3   Years of education: Not on file   Highest education level: Not on file  Occupational History   Occupation: Banker    Employer: [employer]  Tobacco Use   Smoking status: Never   Smokeless tobacco: Never  Vaping Use   Vaping status: Never Used  Substance and Sexual Activity   Alcohol use: Yes    Comment: occasional   Drug use: No   Sexual activity: Not on file  Other Topics Concern   Not on file  Social History Narrative   Not on file   Social Drivers of Health   Financial Resource Strain: Not on file  Food Insecurity: Not on file  Transportation Needs: Not on file  Physical Activity: Not on file  Stress: Not on file  Social Connections: Not on file  Intimate Partner  Violence: Not on file    Family History  Problem Relation Age of Onset   Diabetes Mother    Deep vein thrombosis Mother    Allergies Son     No Known Allergies  Outpatient Medications Prior to Visit  Medication Sig   atorvastatin  (LIPITOR) 40 MG tablet TAKE 1 TABLET BY MOUTH DAILY AT  6 PM   carvedilol  (COREG ) 25 MG tablet Take 25 mg by mouth 2 (two) times daily with a meal.    [facility] IRON 325 (65 Fe) MG tablet Take 1 tablet by mouth 2 (two) times daily. (Patient taking differently: Take 1 tablet by mouth once.)   ezetimibe  (ZETIA ) 10 MG tablet TAKE 1 TABLET BY MOUTH DAILY   fluticasone (FLONASE) 50 MCG/ACT nasal spray Place 2 sprays into both nostrils daily.   naproxen  (NAPROSYN ) 500 MG tablet TAKE 1 TABLET BY MOUTH TWICE  DAILY AS NEEDED WITH MEALS   pantoprazole  (PROTONIX ) 40 MG tablet TAKE 1 TABLET BY MOUTH EVERY OTHER DAY   tamsulosin  (FLOMAX ) 0.4 MG CAPS capsule Take 0.4 mg by mouth daily.   [DISCONTINUED] valsartan -hydrochlorothiazide  (DIOVAN -HCT) 320-12.5 MG tablet TAKE 1 TABLET BY MOUTH DAILY IN  THE MORNING   No facility-administered medications  prior to visit.    Review of Systems  Constitutional: Negative.  Negative for weight loss.  HENT: Negative.    Eyes: Negative.   Respiratory: Negative.    Cardiovascular: Negative.   Gastrointestinal:  Positive for heartburn (rarely). Negative for constipation.  Genitourinary: Negative.   Musculoskeletal:  Positive for joint pain (right hip and left shoulder).  Skin: Negative.   Neurological: Negative.   Endo/Heme/Allergies: Negative.        Objective:   BP 124/75   Pulse 63   Temp 97.7 F (36.5 C)   Ht 6' 1 (1.854 m)   Wt 216 lb 12.8 oz (98.3 kg)   SpO2 96%   BMI 28.60 kg/m   Vitals:   04/20/24 1037  BP: 124/75  Pulse: 63  Temp: 97.7 F (36.5 C)  Height: 6' 1 (1.854 m)  Weight: 216 lb 12.8 oz (98.3 kg)  SpO2: 96%  BMI (Calculated): 28.61    Physical Exam Vitals reviewed.  Constitutional:       Appearance: Normal appearance.  HENT:     Head: Normocephalic.     Left Ear: There is no impacted cerumen.     Nose: Nose normal.     Mouth/Throat:     Mouth: Mucous membranes are moist.     Pharynx: No posterior oropharyngeal erythema.  Eyes:     Extraocular Movements: Extraocular movements intact.     Pupils: Pupils are equal, round, and reactive to light.  Cardiovascular:     Rate and Rhythm: Regular rhythm.     Chest Wall: PMI is not displaced.     Pulses: Normal pulses.     Heart sounds: Normal heart sounds. No murmur heard. Pulmonary:     Effort: Pulmonary effort is normal.     Breath sounds: Normal air entry. No rhonchi or rales.  Abdominal:     General: Abdomen is flat. Bowel sounds are normal. There is no distension.     Palpations: Abdomen is soft. There is no hepatomegaly, splenomegaly or mass.     Tenderness: There is no abdominal tenderness.  Musculoskeletal:        General: Normal range of motion.     Right shoulder: Normal.     Left shoulder: Normal. No tenderness. Normal range of motion.     Cervical back: Normal range of motion and neck supple.     Right lower leg: No edema.     Left lower leg: No edema.  Skin:    General: Skin is warm and dry.     Findings: Abrasion (right shin) present.  Neurological:     General: No focal deficit present.     Mental Status: He is alert and oriented to person, place, and time.     Cranial Nerves: No cranial nerve deficit.     Motor: No weakness.  Psychiatric:        Mood and Affect: Mood normal.        Behavior: Behavior normal.      Results for orders placed or performed in visit on 04/20/24  POCT CBG (Fasting - Glucose)  Result Value Ref Range   Glucose Fasting, POC 109 (A) 70 - 99 mg/dL    Recent Results (from the past 2160 hours)  BMP8+Anion Gap     Status: Abnormal   Collection Time: 01/27/24  9:34 AM  Result Value Ref Range   Glucose 100 (H) 70 - 99 mg/dL   BUN 27 8 - 27 mg/dL   Creatinine, Ser  1.17  0.76 - 1.27 mg/dL   eGFR 66 >40 fO/fpw/8.26   BUN/Creatinine Ratio 23 10 - 24   Sodium 141 134 - 144 mmol/L   Potassium 4.5 3.5 - 5.2 mmol/L   Chloride 104 96 - 106 mmol/L   CO2 21 20 - 29 mmol/L   Anion Gap 16.0 10.0 - 18.0 mmol/L   Calcium  9.4 8.6 - 10.2 mg/dL  Comprehensive metabolic panel with GFR     Status: Abnormal   Collection Time: 01/27/24  9:34 AM  Result Value Ref Range   Glucose 102 (H) 70 - 99 mg/dL   BUN 29 (H) 8 - 27 mg/dL   Creatinine, Ser 8.78 0.76 - 1.27 mg/dL   eGFR 63 >40 fO/fpw/8.26   BUN/Creatinine Ratio 24 10 - 24   Sodium 140 134 - 144 mmol/L   Potassium 4.5 3.5 - 5.2 mmol/L   Chloride 105 96 - 106 mmol/L   CO2 23 20 - 29 mmol/L   Calcium  9.4 8.6 - 10.2 mg/dL   Total Protein 6.9 6.0 - 8.5 g/dL   Albumin 4.1 3.8 - 4.8 g/dL   Globulin, Total 2.8 1.5 - 4.5 g/dL   Bilirubin Total 0.6 0.0 - 1.2 mg/dL   Alkaline Phosphatase 62 44 - 121 IU/L   AST 20 0 - 40 IU/L   ALT 16 0 - 44 IU/L  Lipid panel     Status: None   Collection Time: 01/27/24  9:34 AM  Result Value Ref Range   Cholesterol, Total 126 100 - 199 mg/dL   Triglycerides 50 0 - 149 mg/dL   HDL 41 >60 mg/dL   VLDL Cholesterol Cal 11 5 - 40 mg/dL   LDL Chol Calc (NIH) 74 0 - 99 mg/dL   Chol/HDL Ratio 3.1 0.0 - 5.0 ratio    Comment:                                   T. Chol/HDL Ratio                                             Men  Women                               1/2 Avg.Risk  3.4    3.3                                   Avg.Risk  5.0    4.4                                2X Avg.Risk  9.6    7.1                                3X Avg.Risk 23.4   11.0   CBC With Diff/Platelet     Status: Abnormal   Collection Time: 01/27/24  9:34 AM  Result Value Ref Range   WBC 5.8 3.4 - 10.8 x10E3/uL   RBC 4.16 4.14 - 5.80 x10E6/uL   Hemoglobin 12.7 (L) 13.0 -  17.7 g/dL   Hematocrit 61.2 62.4 - 51.0 %   MCV 93 79 - 97 fL   MCH 30.5 26.6 - 33.0 pg   MCHC 32.8 31.5 - 35.7 g/dL   RDW 87.0 88.3 - 84.5 %    Platelets 123 (L) 150 - 450 x10E3/uL   Neutrophils 31 Not Estab. %   Lymphs 32 Not Estab. %   Monocytes 15 Not Estab. %   Eos 21 Not Estab. %   Basos 1 Not Estab. %   Neutrophils Absolute 1.8 1.4 - 7.0 x10E3/uL   Lymphocytes Absolute 1.8 0.7 - 3.1 x10E3/uL   Monocytes Absolute 0.9 0.1 - 0.9 x10E3/uL   EOS (ABSOLUTE) 1.2 (H) 0.0 - 0.4 x10E3/uL   Basophils Absolute 0.0 0.0 - 0.2 x10E3/uL   Immature Granulocytes 0 Not Estab. %   Immature Grans (Abs) 0.0 0.0 - 0.1 x10E3/uL  PSA     Status: Abnormal   Collection Time: 01/27/24  9:34 AM  Result Value Ref Range   Prostate Specific Ag, Serum 11.9 (H) 0.0 - 4.0 ng/mL    Comment: Roche ECLIA methodology. According to the American Urological Association, Serum PSA should decrease and remain at undetectable levels after radical prostatectomy. The AUA defines biochemical recurrence as an initial PSA value 0.2 ng/mL or greater followed by a subsequent confirmatory PSA value 0.2 ng/mL or greater. Values obtained with different assay methods or kits cannot be used interchangeably. Results cannot be interpreted as absolute evidence of the presence or absence of malignant disease.   Comprehensive metabolic panel     Status: Abnormal   Collection Time: 04/16/24 11:30 AM  Result Value Ref Range   Glucose 120 (H) 70 - 99 mg/dL   BUN 27 8 - 27 mg/dL   Creatinine, Ser 8.88 0.76 - 1.27 mg/dL   eGFR 70 >40 fO/fpw/8.26   BUN/Creatinine Ratio 24 10 - 24   Sodium 140 134 - 144 mmol/L   Potassium 4.1 3.5 - 5.2 mmol/L   Chloride 104 96 - 106 mmol/L   CO2 25 20 - 29 mmol/L   Calcium  9.5 8.6 - 10.2 mg/dL   Total Protein 6.5 6.0 - 8.5 g/dL   Albumin 3.9 3.8 - 4.8 g/dL   Globulin, Total 2.6 1.5 - 4.5 g/dL   Bilirubin Total 0.4 0.0 - 1.2 mg/dL   Alkaline Phosphatase 67 47 - 123 IU/L   AST 17 0 - 40 IU/L   ALT 20 0 - 44 IU/L  CK     Status: None   Collection Time: 04/16/24 11:30 AM  Result Value Ref Range   Total CK 179 41 - 331 U/L  Hemoglobin A1c      Status: Abnormal   Collection Time: 04/16/24 11:31 AM  Result Value Ref Range   Hgb A1c MFr Bld 5.9 (H) 4.8 - 5.6 %    Comment:          Prediabetes: 5.7 - 6.4          Diabetes: >6.4          Glycemic control for adults with diabetes: <7.0    Est. average glucose Bld gHb Est-mCnc 123 mg/dL  Lipid panel     Status: Abnormal   Collection Time: 04/16/24 11:31 AM  Result Value Ref Range   Cholesterol, Total 289 (H) 100 - 199 mg/dL   Triglycerides 777 (H) 0 - 149 mg/dL   HDL 64 >60 mg/dL   VLDL Cholesterol Cal 42 (H) 5 - 40 mg/dL  LDL Chol Calc (NIH) 183 (H) 0 - 99 mg/dL   Chol/HDL Ratio 4.5 0.0 - 5.0 ratio    Comment:                                   T. Chol/HDL Ratio                                             Men  Women                               1/2 Avg.Risk  3.4    3.3                                   Avg.Risk  5.0    4.4                                2X Avg.Risk  9.6    7.1                                3X Avg.Risk 23.4   11.0   POCT CBG (Fasting - Glucose)     Status: Abnormal   Collection Time: 04/20/24 10:47 AM  Result Value Ref Range   Glucose Fasting, POC 109 (A) 70 - 99 mg/dL      Assessment & Plan:  Larry [name] was seen today for annual exam.  Prediabetes -     POCT CBG (Fasting - Glucose)  Essential (primary) hypertension Overview: Followed by Dr JAYSON Dale Gull  Orders: -     Valsartan -hydroCHLOROthiazide ; TAKE 1 TABLET BY MOUTH DAILY IN  THE MORNING  Dispense: 100 tablet; Refill: 2  Mixed hyperlipidemia -     Lipid panel; Standing -     Comprehensive metabolic panel with GFR; Standing  Sprain of left shoulder, unspecified shoulder sprain type, initial encounter  Advised to take naproxen  bid scheduled for 1 wk and apply heating pad daily.  Stricter low calorie diet, low cholesterol and low fat diet and exercise as much as possible   Problem List Items Addressed This Visit       Cardiovascular and Mediastinum   Essential (primary) hypertension    Relevant Medications   valsartan -hydrochlorothiazide  (DIOVAN -HCT) 320-12.5 MG tablet     Other   HLD (hyperlipidemia)   Relevant Medications   valsartan -hydrochlorothiazide  (DIOVAN -HCT) 320-12.5 MG tablet   Other Relevant Orders   Lipid panel   Comprehensive metabolic panel with GFR   Prediabetes - Primary   Relevant Orders   POCT CBG (Fasting - Glucose) (Completed)   Other Visit Diagnoses       Sprain of left shoulder, unspecified shoulder sprain type, initial encounter           Return in 3 months (on 07/21/2024) for fu with labs prior.   Total time spent: 40 minutes. This time includes review of previous notes and results and patient face to face interaction during today'Branae Crail visit.    Sherrill Cinderella Perry, MD  04/20/2024   This document may have been prepared by [employer]  Recognition software and as such may include unintentional dictation errors.

## 2024-04-27 ENCOUNTER — Ambulatory Visit: Admitting: Internal Medicine

## 2024-05-04 ENCOUNTER — Other Ambulatory Visit: Payer: Self-pay | Admitting: Internal Medicine

## 2024-05-04 DIAGNOSIS — L03115 Cellulitis of right lower limb: Secondary | ICD-10-CM

## 2024-05-04 MED ORDER — DOXYCYCLINE MONOHYDRATE 100 MG PO TABS: 100.0000 mg | ORAL_TABLET | Freq: Two times a day (BID) | ORAL | 0 refills | Status: AC

## 2024-05-07 ENCOUNTER — Encounter: Payer: Self-pay | Admitting: Internal Medicine

## 2024-05-30 ENCOUNTER — Other Ambulatory Visit: Payer: Self-pay | Admitting: Internal Medicine

## 2024-05-30 DIAGNOSIS — K219 Gastro-esophageal reflux disease without esophagitis: Secondary | ICD-10-CM

## 2024-07-02 ENCOUNTER — Other Ambulatory Visit

## 2024-07-02 DIAGNOSIS — E782 Mixed hyperlipidemia: Secondary | ICD-10-CM

## 2024-07-03 LAB — COMPREHENSIVE METABOLIC PANEL WITH GFR
ALT: 18 IU/L (ref 0–44)
AST: 19 IU/L (ref 0–40)
Albumin: 4 g/dL (ref 3.8–4.8)
Alkaline Phosphatase: 64 IU/L (ref 47–123)
BUN/Creatinine Ratio: 20 (ref 10–24)
BUN: 21 mg/dL (ref 8–27)
Bilirubin Total: 0.6 mg/dL (ref 0.0–1.2)
CO2: 24 mmol/L (ref 20–29)
Calcium: 9.4 mg/dL (ref 8.6–10.2)
Chloride: 104 mmol/L (ref 96–106)
Creatinine, Ser: 1.06 mg/dL (ref 0.76–1.27)
Globulin, Total: 2.6 g/dL (ref 1.5–4.5)
Glucose: 78 mg/dL (ref 70–99)
Potassium: 4.2 mmol/L (ref 3.5–5.2)
Sodium: 140 mmol/L (ref 134–144)
Total Protein: 6.6 g/dL (ref 6.0–8.5)
eGFR: 74 mL/min/1.73

## 2024-07-03 LAB — LIPID PANEL
Chol/HDL Ratio: 3 ratio (ref 0.0–5.0)
Cholesterol, Total: 125 mg/dL (ref 100–199)
HDL: 41 mg/dL
LDL Chol Calc (NIH): 71 mg/dL (ref 0–99)
Triglycerides: 63 mg/dL (ref 0–149)
VLDL Cholesterol Cal: 13 mg/dL (ref 5–40)

## 2024-07-05 ENCOUNTER — Ambulatory Visit: Admitting: Internal Medicine

## 2024-07-05 ENCOUNTER — Ambulatory Visit: Payer: Self-pay | Admitting: Internal Medicine

## 2024-07-05 VITALS — BP 124/60 | HR 66 | Temp 96.6°F | Ht 73.0 in | Wt 212.0 lb

## 2024-07-05 DIAGNOSIS — L97812 Non-pressure chronic ulcer of other part of right lower leg with fat layer exposed: Secondary | ICD-10-CM | POA: Diagnosis not present

## 2024-07-05 DIAGNOSIS — Z0130 Encounter for examination of blood pressure without abnormal findings: Secondary | ICD-10-CM

## 2024-07-05 DIAGNOSIS — L97909 Non-pressure chronic ulcer of unspecified part of unspecified lower leg with unspecified severity: Secondary | ICD-10-CM | POA: Insufficient documentation

## 2024-07-05 DIAGNOSIS — I872 Venous insufficiency (chronic) (peripheral): Secondary | ICD-10-CM | POA: Diagnosis not present

## 2024-07-05 DIAGNOSIS — M25512 Pain in left shoulder: Secondary | ICD-10-CM | POA: Diagnosis not present

## 2024-07-05 DIAGNOSIS — S43421A Sprain of right rotator cuff capsule, initial encounter: Secondary | ICD-10-CM | POA: Insufficient documentation

## 2024-07-05 NOTE — Progress Notes (Unsigned)
 "  Established Patient Office Visit  Subjective:  Patient ID: Larry [name], male    DOB: 1950-09-05  Age: 74 y.o. MRN: [id number]  Chief Complaint  Patient presents with   Follow-up    3 months follow up with lab results     No new complaints, here for lab review and medication refills. LDL and TC well controlled on lab review. Triglycerides also satisfactory now that he'Kamiya Acord fully compliant with statin. C/o persistent left shoulder pain. Also reports non healing ulcers of his right leg.     No other concerns at this time.   Past Medical History:  Diagnosis Date   Abdominal pain    Chest pain    Fever    Generalized headaches    GERD (gastroesophageal reflux disease)    Granulomatous lung disease (HCC) 2012   Courtlyn Aki/p R VATS   Hyperlipidemia    Hypertension    Multiple allergies    Nasal congestion     Past Surgical History:  Procedure Laterality Date   Drainage of pleural effusion.  11/16/2010   ENDOBRONCHIAL ULTRASOUND  11/16/2010   FIBEROPTIC BRONCHOSCOPY  11/16/2010   Dr Brantley, ytology from [facility] on Nov 16, 2010, right lung    Insertion of PleurX catheter.  11/16/2010   Pleural biopsies.  11/16/2010   Right VATS.  11/16/2010    Social History   Socioeconomic History   Marital status: Married    Spouse name: Not on file   Number of children: 3   Years of education: Not on file   Highest education level: Not on file  Occupational History   Occupation: Banker    Employer: [employer]  Tobacco Use   Smoking status: Never   Smokeless tobacco: Never  Vaping Use   Vaping status: Never Used  Substance and Sexual Activity   Alcohol use: Yes    Comment: occasional   Drug use: No   Sexual activity: Not on file  Other Topics Concern   Not on file  Social History Narrative   Not on file   Social Drivers of Health   Tobacco Use: Low Risk (04/20/2024)   Patient History    Smoking Tobacco Use: Never    Smokeless  Tobacco Use: Never    Passive Exposure: Not on file  Financial Resource Strain: Not on file  Food Insecurity: Not on file  Transportation Needs: Not on file  Physical Activity: Not on file  Stress: Not on file  Social Connections: Not on file  Intimate Partner Violence: Not on file  Depression (PHQ2-9): Low Risk (04/20/2024)   Depression (PHQ2-9)    PHQ-2 Score: 2  Alcohol Screen: Not on file  Housing: Not on file  Utilities: Not At Risk (03/10/2023)   [employer] Utilities    Threatened with loss of utilities: No  Health Literacy: Not on file    Family History  Problem Relation Age of Onset   Diabetes Mother    Deep vein thrombosis Mother    Allergies Son     Allergies[1]  Show/hide medication list[2]  Review of Systems  Musculoskeletal:  Positive for joint pain.  Skin:        As in hpi       Objective:   BP 124/60   Pulse 66   Temp (!) 96.6 F (35.9 C) (Tympanic)   Ht 6' 1 (1.854 m)   Wt 212 lb (96.2 kg)   SpO2 96%   BMI 27.97 kg/m   Vitals:  07/05/24 1037  BP: 124/60  Pulse: 66  Temp: (!) 96.6 F (35.9 C)  Height: 6' 1 (1.854 m)  Weight: 212 lb (96.2 kg)  SpO2: 96%  TempSrc: Tympanic  BMI (Calculated): 27.98    Physical Exam Vitals reviewed.  Constitutional:      Appearance: Normal appearance.  HENT:     Head: Normocephalic.     Left Ear: There is no impacted cerumen.     Nose: Nose normal.     Mouth/Throat:     Mouth: Mucous membranes are moist.     Pharynx: No posterior oropharyngeal erythema.  Eyes:     Extraocular Movements: Extraocular movements intact.     Pupils: Pupils are equal, round, and reactive to light.  Cardiovascular:     Rate and Rhythm: Regular rhythm.     Chest Wall: PMI is not displaced.     Pulses: Normal pulses.     Heart sounds: Normal heart sounds. No murmur heard. Pulmonary:     Effort: Pulmonary effort is normal.     Breath sounds: Normal air entry. No rhonchi or rales.  Abdominal:     General: Abdomen  is flat. Bowel sounds are normal. There is no distension.     Palpations: Abdomen is soft. There is no hepatomegaly, splenomegaly or mass.     Tenderness: There is no abdominal tenderness.  Musculoskeletal:        General: Normal range of motion.     Right shoulder: Normal.     Left shoulder: Normal. No tenderness. Normal range of motion.     Cervical back: Normal range of motion and neck supple.     Right lower leg: No edema.     Left lower leg: No edema.  Skin:    General: Skin is warm and dry.     Findings: Abrasion (right shin) present.     Comments: 3 ulcers on right lower leg 2x2 cm Chronic hemosiderin staining of both legs  Neurological:     General: No focal deficit present.     Mental Status: He is alert and oriented to person, place, and time.     Cranial Nerves: No cranial nerve deficit.     Motor: No weakness.  Psychiatric:        Mood and Affect: Mood normal.        Behavior: Behavior normal.      No results found for any visits on 07/05/24.  Recent Results (from the past 2160 hours)  Comprehensive metabolic panel     Status: Abnormal   Collection Time: 04/16/24 11:30 AM  Result Value Ref Range   Glucose 120 (H) 70 - 99 mg/dL   BUN 27 8 - 27 mg/dL   Creatinine, Ser 8.88 0.76 - 1.27 mg/dL   eGFR 70 >40 fO/fpw/8.26   BUN/Creatinine Ratio 24 10 - 24   Sodium 140 134 - 144 mmol/L   Potassium 4.1 3.5 - 5.2 mmol/L   Chloride 104 96 - 106 mmol/L   CO2 25 20 - 29 mmol/L   Calcium  9.5 8.6 - 10.2 mg/dL   Total Protein 6.5 6.0 - 8.5 g/dL   Albumin 3.9 3.8 - 4.8 g/dL   Globulin, Total 2.6 1.5 - 4.5 g/dL   Bilirubin Total 0.4 0.0 - 1.2 mg/dL   Alkaline Phosphatase 67 47 - 123 IU/L   AST 17 0 - 40 IU/L   ALT 20 0 - 44 IU/L  CK     Status: None   Collection  Time: 04/16/24 11:30 AM  Result Value Ref Range   Total CK 179 41 - 331 U/L  Hemoglobin A1c     Status: Abnormal   Collection Time: 04/16/24 11:31 AM  Result Value Ref Range   Hgb A1c MFr Bld 5.9 (H) 4.8 -  5.6 %    Comment:          Prediabetes: 5.7 - 6.4          Diabetes: >6.4          Glycemic control for adults with diabetes: <7.0    Est. average glucose Bld gHb Est-mCnc 123 mg/dL  Lipid panel     Status: Abnormal   Collection Time: 04/16/24 11:31 AM  Result Value Ref Range   Cholesterol, Total 289 (H) 100 - 199 mg/dL   Triglycerides 777 (H) 0 - 149 mg/dL   HDL 64 >60 mg/dL   VLDL Cholesterol Cal 42 (H) 5 - 40 mg/dL   LDL Chol Calc (NIH) 816 (H) 0 - 99 mg/dL   Chol/HDL Ratio 4.5 0.0 - 5.0 ratio    Comment:                                   T. Chol/HDL Ratio                                             Men  Women                               1/2 Avg.Risk  3.4    3.3                                   Avg.Risk  5.0    4.4                                2X Avg.Risk  9.6    7.1                                3X Avg.Risk 23.4   11.0   POCT CBG (Fasting - Glucose)     Status: Abnormal   Collection Time: 04/20/24 10:47 AM  Result Value Ref Range   Glucose Fasting, POC 109 (A) 70 - 99 mg/dL  Comprehensive metabolic panel with GFR     Status: None   Collection Time: 07/02/24  8:34 AM  Result Value Ref Range   Glucose 78 70 - 99 mg/dL   BUN 21 8 - 27 mg/dL   Creatinine, Ser 8.93 0.76 - 1.27 mg/dL   eGFR 74 >40 fO/fpw/8.26   BUN/Creatinine Ratio 20 10 - 24   Sodium 140 134 - 144 mmol/L   Potassium 4.2 3.5 - 5.2 mmol/L   Chloride 104 96 - 106 mmol/L   CO2 24 20 - 29 mmol/L   Calcium  9.4 8.6 - 10.2 mg/dL   Total Protein 6.6 6.0 - 8.5 g/dL   Albumin 4.0 3.8 - 4.8 g/dL   Globulin, Total 2.6 1.5 - 4.5 g/dL   Bilirubin Total 0.6 0.0 -  1.2 mg/dL   Alkaline Phosphatase 64 47 - 123 IU/L   AST 19 0 - 40 IU/L   ALT 18 0 - 44 IU/L  Lipid panel     Status: None   Collection Time: 07/02/24  8:34 AM  Result Value Ref Range   Cholesterol, Total 125 100 - 199 mg/dL   Triglycerides 63 0 - 149 mg/dL   HDL 41 >60 mg/dL   VLDL Cholesterol Cal 13 5 - 40 mg/dL   LDL Chol Calc (NIH) 71 0 - 99  mg/dL   Chol/HDL Ratio 3.0 0.0 - 5.0 ratio    Comment:                                   T. Chol/HDL Ratio                                             Men  Women                               1/2 Avg.Risk  3.4    3.3                                   Avg.Risk  5.0    4.4                                2X Avg.Risk  9.6    7.1                                3X Avg.Risk 23.4   11.0       Assessment & Plan:  Linus was seen today for follow-up.  Venous stasis ulcer of other part of right lower leg with fat layer exposed without varicose veins (HCC) -     Ambulatory referral to Wound Clinic  Sprain of right rotator cuff capsule, initial encounter -     DG Shoulder Left; Future    Problem List Items Addressed This Visit       Musculoskeletal and Integument   Sprain of right rotator cuff capsule   Relevant Orders   DG Shoulder Left   Stasis ulcer (HCC) - Primary   Relevant Orders   Ambulatory referral to Wound Clinic    Return in about 3 weeks (around 07/26/2024) for shoulder and wounds.   Total time spent: 30 minutes. This time includes review of previous notes and results and patient face to face interaction during today'Sanel Stemmer visit.    Sherrill Cinderella Perry, MD  07/05/2024   This document may have been prepared by [facility] Voice Recognition software and as such may include unintentional dictation errors.       [1] No Known Allergies [2] Outpatient Medications Prior to Visit  Medication Sig   atorvastatin  (LIPITOR) 40 MG tablet TAKE 1 TABLET BY MOUTH DAILY AT  6 PM   carvedilol  (COREG ) 25 MG tablet Take 25 mg by mouth 2 (two) times daily with a meal.    [employer] IRON 325 (65 Fe) MG tablet Take 1 tablet by  mouth 2 (two) times daily. (Patient taking differently: Take 1 tablet by mouth once.)   ezetimibe  (ZETIA ) 10 MG tablet TAKE 1 TABLET BY MOUTH DAILY   fluticasone (FLONASE) 50 MCG/ACT nasal spray Place 2 sprays into both nostrils daily.   naproxen  (NAPROSYN ) 500 MG  tablet TAKE 1 TABLET BY MOUTH TWICE  DAILY AS NEEDED WITH MEALS   pantoprazole  (PROTONIX ) 40 MG tablet TAKE 1 TABLET BY MOUTH EVERY  OTHER DAY   tamsulosin  (FLOMAX ) 0.4 MG CAPS capsule Take 0.4 mg by mouth daily.   valsartan -hydrochlorothiazide  (DIOVAN -HCT) 320-12.5 MG tablet TAKE 1 TABLET BY MOUTH DAILY IN  THE MORNING   No facility-administered medications prior to visit.  "

## 2024-07-14 ENCOUNTER — Ambulatory Visit
Admission: RE | Admit: 2024-07-14 | Discharge: 2024-07-14 | Disposition: A | Source: Ambulatory Visit | Attending: Internal Medicine | Admitting: Internal Medicine

## 2024-07-14 ENCOUNTER — Ambulatory Visit
Admission: RE | Admit: 2024-07-14 | Discharge: 2024-07-14 | Disposition: A | Discharge status: Home / Self Care | Attending: Internal Medicine | Admitting: Internal Medicine

## 2024-07-14 DIAGNOSIS — S43421A Sprain of right rotator cuff capsule, initial encounter: Secondary | ICD-10-CM | POA: Insufficient documentation

## 2024-07-21 ENCOUNTER — Ambulatory Visit: Payer: Self-pay | Admitting: Internal Medicine

## 2024-07-27 ENCOUNTER — Ambulatory Visit: Admitting: Internal Medicine

## 2024-07-28 ENCOUNTER — Encounter: Payer: Self-pay | Admitting: Internal Medicine

## 2024-07-28 ENCOUNTER — Ambulatory Visit: Admitting: Internal Medicine

## 2024-07-28 VITALS — BP 148/76 | HR 68 | Temp 97.9°F | Ht 73.0 in | Wt 218.8 lb

## 2024-07-28 DIAGNOSIS — J301 Allergic rhinitis due to pollen: Secondary | ICD-10-CM

## 2024-07-28 DIAGNOSIS — M25512 Pain in left shoulder: Secondary | ICD-10-CM | POA: Diagnosis not present

## 2024-07-28 DIAGNOSIS — L97812 Non-pressure chronic ulcer of other part of right lower leg with fat layer exposed: Secondary | ICD-10-CM | POA: Diagnosis not present

## 2024-07-28 DIAGNOSIS — I872 Venous insufficiency (chronic) (peripheral): Secondary | ICD-10-CM | POA: Diagnosis not present

## 2024-07-28 DIAGNOSIS — S43402A Unspecified sprain of left shoulder joint, initial encounter: Secondary | ICD-10-CM

## 2024-07-28 DIAGNOSIS — Z0130 Encounter for examination of blood pressure without abnormal findings: Secondary | ICD-10-CM

## 2024-07-28 DIAGNOSIS — E782 Mixed hyperlipidemia: Secondary | ICD-10-CM | POA: Diagnosis not present

## 2024-07-28 MED ORDER — FLUTICASONE PROPIONATE 50 MCG/ACT NA SUSP: 2.0000 | Freq: Every day | NASAL | 1 refills | Status: AC

## 2024-07-28 NOTE — Progress Notes (Signed)
 "  Established Patient Office Visit  Subjective:  Patient ID: Larry [name], male    DOB: [birth year]  Age: 74 y.o. MRN: [id number]  Chief Complaint  Patient presents with   Follow-up    3 week follow up. Arthritis pain in left shoulder.    Still c/o left shoulder pain and awaits wound care appt.    No other concerns at this time.   Past Medical History:  Diagnosis Date   Abdominal pain    Chest pain    Fever    Generalized headaches    GERD (gastroesophageal reflux disease)    Granulomatous lung disease (HCC) 2012   Stephaniemarie Stoffel/p R VATS   Hyperlipidemia    Hypertension    Multiple allergies    Nasal congestion     Past Surgical History:  Procedure Laterality Date   Drainage of pleural effusion.  11/16/2010   ENDOBRONCHIAL ULTRASOUND  11/16/2010   FIBEROPTIC BRONCHOSCOPY  11/16/2010   Dr Brantley, ytology from [facility] on Nov 16, 2010, right lung    Insertion of PleurX catheter.  11/16/2010   Pleural biopsies.  11/16/2010   Right VATS.  11/16/2010    Social History   Socioeconomic History   Marital status: Married    Spouse name: Not on file   Number of children: 3   Years of education: Not on file   Highest education level: Not on file  Occupational History   Occupation: Banker    Employer: [employer]  Tobacco Use   Smoking status: Never   Smokeless tobacco: Never  Vaping Use   Vaping status: Never Used  Substance and Sexual Activity   Alcohol use: Yes    Comment: occasional   Drug use: No   Sexual activity: Not on file  Other Topics Concern   Not on file  Social History Narrative   Not on file   Social Drivers of Health   Tobacco Use: Low Risk (07/28/2024)   Patient History    Smoking Tobacco Use: Never    Smokeless Tobacco Use: Never    Passive Exposure: Not on file  Financial Resource Strain: Not on file  Food Insecurity: Not on file  Transportation Needs: Not on file  Physical Activity: Not on file  Stress: Not on file  Social  Connections: Not on file  Intimate Partner Violence: Not on file  Depression (PHQ2-9): Low Risk (04/20/2024)   Depression (PHQ2-9)    PHQ-2 Score: 2  Alcohol Screen: Not on file  Housing: Not on file  Utilities: Not At Risk (03/10/2023)   [employer] Utilities    Threatened with loss of utilities: No  Health Literacy: Not on file    Family History  Problem Relation Age of Onset   Diabetes Mother    Deep vein thrombosis Mother    Allergies Son     Allergies[1]  Show/hide medication list[2]  Review of Systems  Constitutional: Negative.  Negative for weight loss.  HENT: Negative.    Eyes: Negative.   Respiratory: Negative.    Cardiovascular: Negative.   Gastrointestinal:  Positive for heartburn (rarely). Negative for constipation.  Genitourinary: Negative.   Musculoskeletal:  Positive for joint pain (left shoulder).  Skin: Negative.   Neurological: Negative.   Endo/Heme/Allergies: Negative.        Objective:   BP (!) 148/76   Pulse 68   Temp 97.9 F (36.6 C)   Ht 6' 1 (1.854 m)   Wt 218 lb 12.8 oz (99.2 kg)  SpO2 96%   BMI 28.87 kg/m   Vitals:   07/28/24 1046  BP: (!) 148/76  Pulse: 68  Temp: 97.9 F (36.6 C)  Height: 6' 1 (1.854 m)  Weight: 218 lb 12.8 oz (99.2 kg)  SpO2: 96%  BMI (Calculated): 28.87    Physical Exam Vitals reviewed.  Constitutional:      Appearance: Normal appearance.  HENT:     Head: Normocephalic.     Left Ear: There is no impacted cerumen.     Nose: Nose normal.     Mouth/Throat:     Mouth: Mucous membranes are moist.     Pharynx: No posterior oropharyngeal erythema.  Eyes:     Extraocular Movements: Extraocular movements intact.     Pupils: Pupils are equal, round, and reactive to light.  Cardiovascular:     Rate and Rhythm: Regular rhythm.     Chest Wall: PMI is not displaced.     Pulses: Normal pulses.     Heart sounds: Normal heart sounds. No murmur heard. Pulmonary:     Effort: Pulmonary effort is normal.      Breath sounds: Normal air entry. No rhonchi or rales.  Abdominal:     General: Abdomen is flat. Bowel sounds are normal. There is no distension.     Palpations: Abdomen is soft. There is no hepatomegaly, splenomegaly or mass.     Tenderness: There is no abdominal tenderness.  Musculoskeletal:        General: Normal range of motion.     Right shoulder: Normal.     Left shoulder: Normal. No tenderness. Normal range of motion.     Cervical back: Normal range of motion and neck supple.     Right lower leg: No edema.     Left lower leg: No edema.  Skin:    General: Skin is warm and dry.     Findings: Abrasion (right shin) present.     Comments: 3 ulcers on right lower leg 2x2 cm Chronic hemosiderin staining of both legs  Neurological:     General: No focal deficit present.     Mental Status: He is alert and oriented to person, place, and time.     Cranial Nerves: No cranial nerve deficit.     Motor: No weakness.  Psychiatric:        Mood and Affect: Mood normal.        Behavior: Behavior normal.      No results found for any visits on 07/28/24.  Recent Results (from the past 2160 hours)  Comprehensive metabolic panel with GFR     Status: None   Collection Time: 07/02/24  8:34 AM  Result Value Ref Range   Glucose 78 70 - 99 mg/dL   BUN 21 8 - 27 mg/dL   Creatinine, Ser 8.93 0.76 - 1.27 mg/dL   eGFR 74 >40 fO/fpw/8.26   BUN/Creatinine Ratio 20 10 - 24   Sodium 140 134 - 144 mmol/L   Potassium 4.2 3.5 - 5.2 mmol/L   Chloride 104 96 - 106 mmol/L   CO2 24 20 - 29 mmol/L   Calcium  9.4 8.6 - 10.2 mg/dL   Total Protein 6.6 6.0 - 8.5 g/dL   Albumin 4.0 3.8 - 4.8 g/dL   Globulin, Total 2.6 1.5 - 4.5 g/dL   Bilirubin Total 0.6 0.0 - 1.2 mg/dL   Alkaline Phosphatase 64 47 - 123 IU/L   AST 19 0 - 40 IU/L   ALT 18 0 -  44 IU/L  Lipid panel     Status: None   Collection Time: 07/02/24  8:34 AM  Result Value Ref Range   Cholesterol, Total 125 100 - 199 mg/dL   Triglycerides 63 0 -  149 mg/dL   HDL 41 >60 mg/dL   VLDL Cholesterol Cal 13 5 - 40 mg/dL   LDL Chol Calc (NIH) 71 0 - 99 mg/dL   Chol/HDL Ratio 3.0 0.0 - 5.0 ratio    Comment:                                   T. Chol/HDL Ratio                                             Men  Women                               1/2 Avg.Risk  3.4    3.3                                   Avg.Risk  5.0    4.4                                2X Avg.Risk  9.6    7.1                                3X Avg.Risk 23.4   11.0       Assessment & Plan:  Larry [name] was seen today for follow-up.  Sprain of left shoulder, unspecified shoulder sprain type, initial encounter -     Ambulatory referral to Physical Therapy  Venous stasis ulcer of other part of right lower leg with fat layer exposed without varicose veins (HCC)  Allergic rhinitis due to pollen, unspecified seasonality -     Fluticasone  Propionate; Place 2 sprays into both nostrils daily.  Dispense: 16 g; Refill: 1  Mixed hyperlipidemia -     Lipid panel -     Comprehensive metabolic panel with GFR    Problem List Items Addressed This Visit       Musculoskeletal and Integument   Sprain of shoulder, left - Primary   Relevant Orders   Ambulatory referral to Physical Therapy   Stasis ulcer (HCC)     Other   HLD (hyperlipidemia)   Other Visit Diagnoses       Allergic rhinitis due to pollen, unspecified seasonality       Relevant Medications   fluticasone  (FLONASE ) 50 MCG/ACT nasal spray       Return in about 9 weeks (around 09/29/2024) for fu with labs prior.   Total time spent: 20 minutes. This time includes review of previous notes and results and patient face to face interaction during today'Rena Hunke visit.    Sherrill Cinderella Perry, MD  07/28/2024   This document may have been prepared by [facility] Voice Recognition software and as such may include unintentional dictation errors.     [1] No Known Allergies [2]  Outpatient Medications Prior to Visit  Medication Sig   atorvastatin  (LIPITOR) 40 MG tablet TAKE 1 TABLET BY MOUTH DAILY AT  6 PM   carvedilol  (COREG ) 25 MG tablet Take 25 mg by mouth 2 (two) times daily with a meal.    [facility] IRON 325 (65 Fe) MG tablet Take 1 tablet by mouth 2 (two) times daily. (Patient taking differently: Take 1 tablet by mouth once.)   ezetimibe  (ZETIA ) 10 MG tablet TAKE 1 TABLET BY MOUTH DAILY   naproxen  (NAPROSYN ) 500 MG tablet TAKE 1 TABLET BY MOUTH TWICE  DAILY AS NEEDED WITH MEALS   pantoprazole  (PROTONIX ) 40 MG tablet TAKE 1 TABLET BY MOUTH EVERY  OTHER DAY   tamsulosin  (FLOMAX ) 0.4 MG CAPS capsule Take 0.4 mg by mouth daily.   valsartan -hydrochlorothiazide  (DIOVAN -HCT) 320-12.5 MG tablet TAKE 1 TABLET BY MOUTH DAILY IN  THE MORNING   [DISCONTINUED] fluticasone  (FLONASE ) 50 MCG/ACT nasal spray Place 2 sprays into both nostrils daily.   No facility-administered medications prior to visit.   "

## 2024-08-03 ENCOUNTER — Encounter: Admitting: Physician Assistant

## 2024-08-16 ENCOUNTER — Encounter: Admitting: Physician Assistant

## 2024-09-29 ENCOUNTER — Ambulatory Visit: Admitting: Internal Medicine

## 2024-10-05 ENCOUNTER — Ambulatory Visit: Admitting: Internal Medicine
